# Patient Record
Sex: Female | Born: 1940 | Race: Black or African American | Hispanic: No | Marital: Married | Smoking: Former smoker
Health system: Southern US, Community
[De-identification: ages and names within clinical notes are randomized; demographics above are authoritative.]

## PROBLEM LIST (undated history)

## (undated) DIAGNOSIS — Z9289 Personal history of other medical treatment: Secondary | ICD-10-CM

## (undated) DIAGNOSIS — C801 Malignant (primary) neoplasm, unspecified: Secondary | ICD-10-CM

## (undated) DIAGNOSIS — I10 Essential (primary) hypertension: Secondary | ICD-10-CM

## (undated) DIAGNOSIS — W19XXXA Unspecified fall, initial encounter: Secondary | ICD-10-CM

## (undated) DIAGNOSIS — R296 Repeated falls: Secondary | ICD-10-CM

## (undated) DIAGNOSIS — D649 Anemia, unspecified: Secondary | ICD-10-CM

## (undated) HISTORY — PX: UPPER GI ENDOSCOPY: SHX6162

## (undated) HISTORY — PX: DILATION AND CURETTAGE OF UTERUS: SHX78

## (undated) HISTORY — PX: OTHER SURGICAL HISTORY: SHX169

---

## 2003-09-11 ENCOUNTER — Emergency Department (HOSPITAL_COMMUNITY): Admission: EM | Admit: 2003-09-11 | Discharge: 2003-09-12 | Payer: Self-pay | Admitting: Emergency Medicine

## 2014-03-03 DIAGNOSIS — C801 Malignant (primary) neoplasm, unspecified: Secondary | ICD-10-CM

## 2014-03-03 HISTORY — DX: Malignant (primary) neoplasm, unspecified: C80.1

## 2014-11-14 ENCOUNTER — Ambulatory Visit (HOSPITAL_COMMUNITY)
Admission: RE | Admit: 2014-11-14 | Discharge: 2014-11-14 | Disposition: A | Discharge status: Home / Self Care | Payer: Medicare Other | Source: Ambulatory Visit | Attending: Vascular Surgery | Admitting: Vascular Surgery

## 2014-11-14 ENCOUNTER — Other Ambulatory Visit (HOSPITAL_COMMUNITY): Payer: Self-pay | Admitting: Family Medicine

## 2014-11-14 DIAGNOSIS — R0989 Other specified symptoms and signs involving the circulatory and respiratory systems: Secondary | ICD-10-CM

## 2014-11-14 DIAGNOSIS — I6523 Occlusion and stenosis of bilateral carotid arteries: Secondary | ICD-10-CM | POA: Diagnosis not present

## 2014-12-19 ENCOUNTER — Encounter (HOSPITAL_COMMUNITY): Payer: Self-pay

## 2014-12-19 ENCOUNTER — Other Ambulatory Visit (HOSPITAL_COMMUNITY): Payer: Self-pay | Admitting: *Deleted

## 2014-12-19 ENCOUNTER — Other Ambulatory Visit (HOSPITAL_COMMUNITY): Payer: Self-pay | Admitting: Physician Assistant

## 2014-12-19 ENCOUNTER — Ambulatory Visit (HOSPITAL_COMMUNITY)
Admission: RE | Admit: 2014-12-19 | Discharge: 2014-12-19 | Disposition: A | Discharge status: Home / Self Care | Payer: Medicare Other | Source: Ambulatory Visit | Attending: Physician Assistant | Admitting: Physician Assistant

## 2014-12-19 DIAGNOSIS — R1903 Right lower quadrant abdominal swelling, mass and lump: Secondary | ICD-10-CM

## 2014-12-19 DIAGNOSIS — K921 Melena: Secondary | ICD-10-CM

## 2014-12-19 DIAGNOSIS — K5900 Constipation, unspecified: Secondary | ICD-10-CM | POA: Diagnosis not present

## 2014-12-19 DIAGNOSIS — I2510 Atherosclerotic heart disease of native coronary artery without angina pectoris: Secondary | ICD-10-CM | POA: Insufficient documentation

## 2014-12-19 DIAGNOSIS — R109 Unspecified abdominal pain: Secondary | ICD-10-CM | POA: Insufficient documentation

## 2014-12-19 DIAGNOSIS — I700 Atherosclerosis of aorta: Secondary | ICD-10-CM | POA: Insufficient documentation

## 2014-12-19 HISTORY — DX: Essential (primary) hypertension: I10

## 2014-12-19 MED ORDER — IOHEXOL 300 MG/ML  SOLN
80.0000 mL | Freq: Once | INTRAMUSCULAR | Status: AC | PRN
  Administered 2014-12-19: 80 mL via INTRAVENOUS

## 2014-12-20 ENCOUNTER — Encounter (HOSPITAL_COMMUNITY)
Admission: RE | Admit: 2014-12-20 | Discharge: 2014-12-20 | Disposition: A | Discharge status: Home / Self Care | Payer: Medicare Other | Source: Ambulatory Visit | Attending: Gastroenterology | Admitting: Gastroenterology

## 2014-12-20 DIAGNOSIS — R195 Other fecal abnormalities: Secondary | ICD-10-CM | POA: Insufficient documentation

## 2014-12-20 DIAGNOSIS — D649 Anemia, unspecified: Secondary | ICD-10-CM | POA: Insufficient documentation

## 2014-12-20 LAB — CBC
HEMATOCRIT: 30.9 % — AB (ref 36.0–46.0)
HEMOGLOBIN: 10 g/dL — AB (ref 12.0–15.0)
MCH: 21.6 pg — AB (ref 26.0–34.0)
MCHC: 32.4 g/dL (ref 30.0–36.0)
MCV: 66.7 fL — ABNORMAL LOW (ref 78.0–100.0)
Platelets: 451 10*3/uL — ABNORMAL HIGH (ref 150–400)
RBC: 4.63 MIL/uL (ref 3.87–5.11)
RDW: 19.5 % — ABNORMAL HIGH (ref 11.5–15.5)
WBC: 11.2 10*3/uL — ABNORMAL HIGH (ref 4.0–10.5)

## 2014-12-20 LAB — PREPARE RBC (CROSSMATCH)

## 2014-12-20 LAB — ABO/RH: ABO/RH(D): B POS

## 2014-12-20 MED ORDER — SODIUM CHLORIDE 0.9 % IV SOLN
Freq: Once | INTRAVENOUS | Status: AC
  Administered 2014-12-20: 10:00:00 via INTRAVENOUS

## 2014-12-21 LAB — TYPE AND SCREEN
ABO/RH(D): B POS
Antibody Screen: NEGATIVE
UNIT DIVISION: 0
Unit division: 0

## 2014-12-22 ENCOUNTER — Other Ambulatory Visit: Payer: Self-pay | Admitting: Gastroenterology

## 2014-12-22 DIAGNOSIS — K6389 Other specified diseases of intestine: Secondary | ICD-10-CM

## 2014-12-22 DIAGNOSIS — C189 Malignant neoplasm of colon, unspecified: Secondary | ICD-10-CM

## 2014-12-25 ENCOUNTER — Other Ambulatory Visit: Payer: Self-pay | Admitting: General Surgery

## 2014-12-25 NOTE — H&P (Signed)
History of Present Illness Olivia Ruff MD; 09/32/6712 6:05 PM) The patient is a 74 year old female who presents with colorectal cancer. 74 year old female who presents to the office with a large right-sided colonic mass concerning for colon cancer. Patient has a history of a recent blood transfusion due to anemia and positive FOBT. She underwent a CT scan of the abdomen and pelvis on 12/19/2014. This showed a large ascending colon mass without any obvious signs of obstruction. Colonoscopy was performed on 12/21/2014. This was done by Dr. Laurence Spates. An ulcerated completely obstructing large mass was found in the rectosigmoid colon. He was unable to evaluate the right side of the colon. She continues to have blood in her stools. She is losing weight (~25lbs). Her CEA is elevated at 7.4. Her CT scan of the abdomen shows no signs of metastatic disease. Her CT scan of the chest is pending.   Other Problems Elbert Ewings, CMA; 12/25/2014 2:02 PM) High blood pressure  Past Surgical History Elbert Ewings, CMA; 12/25/2014 2:02 PM) No pertinent past surgical history  Diagnostic Studies History Elbert Ewings, [place]; 12/25/2014 2:02 PM) Colonoscopy within last year  Allergies Elbert Ewings, CMA; 12/25/2014 2:03 PM) No Known Drug Allergies 12/25/2014  Medication History Elbert Ewings, CMA; 12/25/2014 2:03 PM) AmLODIPine Besylate (10MG  Tablet, Oral) Active. Ciprofloxacin HCl (500MG  Tablet, Oral) Active. Triamterene-HCTZ (37.5-25MG  Tablet, Oral) Active. MiraLax (Oral) Active. Ferrous Sulfate (250MG  Capsule, Oral) Active. Medications Reconciled  Social History Elbert Ewings, [place]; 12/25/2014 2:02 PM) Alcohol use Remotely quit alcohol use. Caffeine use Carbonated beverages, Coffee, Tea. Tobacco use Former smoker.     Review of Systems Elbert Ewings CMA; 12/25/2014 2:02 PM) HEENT Present- Sore Throat and Wears glasses/contact lenses. Not Present- Earache, Hearing Loss,  Hoarseness, Nose Bleed, Oral Ulcers, Ringing in the Ears, Seasonal Allergies, Sinus Pain, Visual Disturbances and Yellow Eyes. Gastrointestinal Present- Bloody Stool and Difficulty Swallowing. Not Present- Abdominal Pain, Bloating, Change in Bowel Habits, Chronic diarrhea, Constipation, Excessive gas, Gets full quickly at meals, Hemorrhoids, Indigestion, Nausea, Rectal Pain and Vomiting. Female Genitourinary Not Present- Frequency, Nocturia, Painful Urination, Pelvic Pain and Urgency. Musculoskeletal Not Present- Back Pain, Joint Pain, Joint Stiffness, Muscle Pain, Muscle Weakness and Swelling of Extremities. Neurological Not Present- Decreased Memory, Fainting, Headaches, Numbness, Seizures, Tingling, Tremor, Trouble walking and Weakness. Hematology Not Present- Easy Bruising, Excessive bleeding, Gland problems, HIV and Persistent Infections.  Vitals Elbert Ewings CMA; 12/25/2014 2:04 PM) 12/25/2014 2:03 PM Weight: 118 lb Height: 62.5in Body Surface Area: 1.54 m Body Mass Index: 21.24 kg/m  Temp.: 97.53F(Temporal)  Pulse: 66 (Regular)  BP: 128/60 (Sitting, Left Arm, Standard)      Physical Exam Olivia Ruff MD; 45/80/9983 5:47 PM)  General Mental Status-Alert. General Appearance-Not in acute distress. Build & Nutrition-Well nourished. Posture-Normal posture. Gait-Normal.  Head and Neck Head-normocephalic, atraumatic with no lesions or palpable masses. Trachea-midline.  Chest and Lung Exam Chest and lung exam reveals -on auscultation, normal breath sounds, no adventitious sounds and normal vocal resonance.  Cardiovascular Cardiovascular examination reveals -normal heart sounds, regular rate and rhythm with no murmurs and femoral artery auscultation bilaterally reveals normal pulses, no bruits, no thrills.  Abdomen Inspection Inspection of the abdomen reveals - No Hernias. Palpation/Percussion Palpation and Percussion of the abdomen reveal -  Soft, Non Tender, No Rigidity (guarding), No hepatosplenomegaly and No Palpable abdominal masses.  Neurologic Neurologic evaluation reveals -alert and oriented x 3 with no impairment of recent or remote memory, normal attention span and ability to concentrate, normal sensation  and normal coordination.  Musculoskeletal Normal Exam - Bilateral-Upper Extremity Strength Normal and Lower Extremity Strength Normal.    Assessment & Plan Olivia Ruff MD; 33/29/5188 6:04 PM)  MASS OF COLON (K63.9) Impression: 74 year old female in good health who presents to the office with anemia. It appears she had a CT scan which showed a large right-sided mass concerning for colon cancer. Last week she underwent a colonoscopy with Dr. Oletta Lamas which showed an obstructing rectosigmoid mass at 15 cm. The rest of her colon was unable to be examined. On exam today her abdomen is soft and nondistended. She has no signs of obstruction. She is losing weight. She continues to have rectal bleeding. I discussed this with Dr. Oletta Lamas and he will bring her back for tattooing of the rectosigmoid lesion. We will then plan on doing a minimally invasive right colectomy and sigmoidectomy combined. We discussed doing a total colectomy with ileorectal anastomosis as well. We discussed the risks and benefits of this, which include need for additional screening in the future and possible need for additional surgery as well as the risks assumed with 2 anastomosis instead of one. Benfits mainly include having her remaining colon to hopefully prevent chronic diarrhea.  The surgery and anatomy were described to the patient as well as the risks of surgery and the possible complications. These include: Bleeding, deep abdominal infections and possible wound complications such as hernia and infection, damage to adjacent structures, leak of surgical connections, which can lead to other surgeries and possibly an ostomy, possible need for other  procedures, such as abscess drains in radiology, possible prolonged hospital stay, possible diarrhea from removal of part of the colon, possible constipation from narcotics, possible bowel, bladder or sexual dysfunction if having rectal surgery, prolonged fatigue/weakness or appetite loss, possible early recurrence of of disease, possible complications of their medical problems such as heart disease or arrhythmias or lung problems, death (less than 1%). I believe the patient understands and wishes to proceed with the surgery.

## 2014-12-29 ENCOUNTER — Ambulatory Visit
Admission: RE | Admit: 2014-12-29 | Discharge: 2014-12-29 | Disposition: A | Discharge status: Home / Self Care | Payer: Medicare Other | Source: Ambulatory Visit | Attending: Gastroenterology | Admitting: Gastroenterology

## 2014-12-29 ENCOUNTER — Other Ambulatory Visit: Payer: Self-pay | Admitting: Gastroenterology

## 2014-12-29 DIAGNOSIS — C189 Malignant neoplasm of colon, unspecified: Secondary | ICD-10-CM

## 2014-12-29 DIAGNOSIS — K6389 Other specified diseases of intestine: Secondary | ICD-10-CM

## 2015-02-09 ENCOUNTER — Encounter (HOSPITAL_COMMUNITY)
Admission: RE | Admit: 2015-02-09 | Discharge: 2015-02-09 | Disposition: A | Discharge status: Home / Self Care | Payer: Medicare Other | Source: Ambulatory Visit | Attending: General Surgery | Admitting: General Surgery

## 2015-02-09 ENCOUNTER — Encounter (HOSPITAL_COMMUNITY): Payer: Self-pay

## 2015-02-09 DIAGNOSIS — Z01810 Encounter for preprocedural cardiovascular examination: Secondary | ICD-10-CM | POA: Insufficient documentation

## 2015-02-09 DIAGNOSIS — Z01812 Encounter for preprocedural laboratory examination: Secondary | ICD-10-CM | POA: Diagnosis not present

## 2015-02-09 HISTORY — DX: Repeated falls: R29.6

## 2015-02-09 HISTORY — DX: Anemia, unspecified: D64.9

## 2015-02-09 HISTORY — DX: Personal history of other medical treatment: Z92.89

## 2015-02-09 HISTORY — DX: Unspecified fall, initial encounter: W19.XXXA

## 2015-02-09 LAB — BASIC METABOLIC PANEL
Anion gap: 12 (ref 5–15)
BUN: 20 mg/dL (ref 6–20)
CALCIUM: 9.5 mg/dL (ref 8.9–10.3)
CHLORIDE: 100 mmol/L — AB (ref 101–111)
CO2: 24 mmol/L (ref 22–32)
CREATININE: 0.88 mg/dL (ref 0.44–1.00)
GFR calc Af Amer: >60 mL/min (ref >60)
GFR calc non Af Amer: >60 mL/min (ref >60)
Glucose, Bld: 96 mg/dL (ref 65–99)
Potassium: 4 mmol/L (ref 3.5–5.1)
SODIUM: 136 mmol/L (ref 135–145)

## 2015-02-09 LAB — CBC
HCT: 25.2 % — ABNORMAL LOW (ref 36.0–46.0)
Hemoglobin: 7.7 g/dL — ABNORMAL LOW (ref 12.0–15.0)
MCH: 23.5 pg — AB (ref 26.0–34.0)
MCHC: 30.6 g/dL (ref 30.0–36.0)
MCV: 76.8 fL — AB (ref 78.0–100.0)
PLATELETS: 719 10*3/uL — AB (ref 150–400)
RBC: 3.28 MIL/uL — ABNORMAL LOW (ref 3.87–5.11)
RDW: 17.8 % — AB (ref 11.5–15.5)
WBC: 11.9 10*3/uL — AB (ref 4.0–10.5)

## 2015-02-09 NOTE — Patient Instructions (Signed)
BRANA LUCHINI  02/09/2015   Your procedure is scheduled on: Thursday February 15, 2015   Report to [facility] Main  Entrance take [place]  elevators to 3rd floor to  [place] at 5:00 AM.  Call this number if you have problems the morning of surgery [phone]   Remember: ONLY 1 PERSON MAY GO WITH YOU TO SHORT STAY TO GET  READY MORNING OF [place].  Do not eat food or drink liquids :After Midnight.     Take these medicines the morning of surgery with A SIP OF WATER: NONE                               You may not have any metal on your body including hair pins and              piercings  Do not wear jewelry, make-up, lotions, powders or perfumes, deodorant             Do not wear nail polish.  Do not shave  48 hours prior to surgery.              Do not bring valuables to the hospital. [place].  Contacts, dentures or bridgework may not be worn into surgery.  Leave suitcase in the car. After surgery it may be brought to your room.    Special Instructions: FOLLOW SURGEON'S INSTRUCTION IN REGARDS TO BOWEL PREPARATION PRIOR TO SURGICAL DATE                                         DO NOT TAKE ANY ASPIRIN OR HERBAL MEDICATIONS 5 DAYS PRIOR TO SURGERY            _____________________________________________________________________             [facility] - Preparing for Surgery Before surgery, you can play an important role.  Because skin is not sterile, your skin needs to be as free of germs as possible.  You can reduce the number of germs on your skin by washing with CHG (chlorahexidine gluconate) soap before surgery.  CHG is an antiseptic cleaner which kills germs and bonds with the skin to continue killing germs even after washing. Please DO NOT use if you have an allergy to CHG or antibacterial soaps.  If your skin becomes reddened/irritated stop using the CHG and inform your nurse when you  arrive at Short Stay. Do not shave (including legs and underarms) for at least 48 hours prior to the first CHG shower.  You may shave your face/neck. Please follow these instructions carefully:  1.  Shower with CHG Soap the night before surgery and the  morning of Surgery.  2.  If you choose to wash your hair, wash your hair first as usual with your  normal  shampoo.  3.  After you shampoo, rinse your hair and body thoroughly to remove the  shampoo.                           4.  Use CHG as you would any other liquid  soap.  You can apply chg directly  to the skin and wash                       Gently with a scrungie or clean washcloth.  5.  Apply the CHG Soap to your body ONLY FROM THE NECK DOWN.   Do not use on face/ open                           Wound or open sores. Avoid contact with eyes, ears mouth and genitals (private parts).                       Wash face,  Genitals (private parts) with your normal soap.             6.  Wash thoroughly, paying special attention to the area where your surgery  will be performed.  7.  Thoroughly rinse your body with warm water from the neck down.  8.  DO NOT shower/wash with your normal soap after using and rinsing off  the CHG Soap.                9.  Pat yourself dry with a clean towel.            10.  Wear clean pajamas.            11.  Place clean sheets on your bed the night of your first shower and do not  sleep with pets. Day of Surgery : Do not apply any lotions/deodorants the morning of surgery.  Please wear clean clothes to the hospital/surgery center.  FAILURE TO FOLLOW THESE INSTRUCTIONS MAY RESULT IN THE CANCELLATION OF YOUR SURGERY PATIENT SIGNATURE_________________________________  NURSE SIGNATURE__________________________________  ________________________________________________________________________

## 2015-02-09 NOTE — Progress Notes (Signed)
CT Chest 12/29/2014 epic

## 2015-02-10 LAB — HEMOGLOBIN A1C
Hgb A1c MFr Bld: 5.1 % (ref 4.8–5.6)
Mean Plasma Glucose: 100 mg/dL

## 2015-02-15 ENCOUNTER — Inpatient Hospital Stay (HOSPITAL_COMMUNITY): Payer: Medicare Other | Admitting: Anesthesiology

## 2015-02-15 ENCOUNTER — Inpatient Hospital Stay (HOSPITAL_COMMUNITY)
Admission: RE | Admit: 2015-02-15 | Discharge: 2015-02-20 | DRG: 330 | Disposition: A | Discharge status: Home / Self Care | Payer: Medicare Other | Source: Ambulatory Visit | Attending: General Surgery | Admitting: General Surgery

## 2015-02-15 ENCOUNTER — Encounter (HOSPITAL_COMMUNITY)
Admission: RE | Disposition: A | Discharge status: Home / Self Care | Payer: Self-pay | Source: Ambulatory Visit | Attending: General Surgery

## 2015-02-15 ENCOUNTER — Encounter (HOSPITAL_COMMUNITY): Payer: Self-pay | Admitting: *Deleted

## 2015-02-15 DIAGNOSIS — D62 Acute posthemorrhagic anemia: Secondary | ICD-10-CM | POA: Diagnosis present

## 2015-02-15 DIAGNOSIS — N736 Female pelvic peritoneal adhesions (postinfective): Secondary | ICD-10-CM | POA: Diagnosis present

## 2015-02-15 DIAGNOSIS — C182 Malignant neoplasm of ascending colon: Principal | ICD-10-CM | POA: Diagnosis present

## 2015-02-15 DIAGNOSIS — I10 Essential (primary) hypertension: Secondary | ICD-10-CM | POA: Diagnosis present

## 2015-02-15 DIAGNOSIS — Z87891 Personal history of nicotine dependence: Secondary | ICD-10-CM | POA: Diagnosis not present

## 2015-02-15 DIAGNOSIS — Z01812 Encounter for preprocedural laboratory examination: Secondary | ICD-10-CM | POA: Diagnosis not present

## 2015-02-15 DIAGNOSIS — K639 Disease of intestine, unspecified: Secondary | ICD-10-CM | POA: Diagnosis present

## 2015-02-15 DIAGNOSIS — K625 Hemorrhage of anus and rectum: Secondary | ICD-10-CM | POA: Diagnosis present

## 2015-02-15 DIAGNOSIS — E861 Hypovolemia: Secondary | ICD-10-CM | POA: Diagnosis present

## 2015-02-15 HISTORY — PX: LAPAROSCOPIC PARTIAL COLECTOMY: SHX5907

## 2015-02-15 HISTORY — PX: UPPER GI ENDOSCOPY: SHX6162

## 2015-02-15 LAB — POCT I-STAT 4, (NA,K, GLUC, HGB,HCT)
GLUCOSE: 99 mg/dL (ref 65–99)
HEMATOCRIT: 27 % — AB (ref 36.0–46.0)
Hemoglobin: 9.2 g/dL — ABNORMAL LOW (ref 12.0–15.0)
POTASSIUM: 2.8 mmol/L — AB (ref 3.5–5.1)
SODIUM: 130 mmol/L — AB (ref 135–145)

## 2015-02-15 LAB — ABO/RH: ABO/RH(D): B POS

## 2015-02-15 SURGERY — LAPAROSCOPIC PARTIAL COLECTOMY
Anesthesia: General

## 2015-02-15 MED ORDER — DIPHENHYDRAMINE HCL 12.5 MG/5ML PO ELIX: 12.5000 mg | ORAL_SOLUTION | Freq: Four times a day (QID) | ORAL | Status: DC | PRN

## 2015-02-15 MED ORDER — FENTANYL CITRATE (PF) 250 MCG/5ML IJ SOLN
INTRAMUSCULAR | Status: AC
  Filled 2015-02-15: qty 5

## 2015-02-15 MED ORDER — ALVIMOPAN 12 MG PO CAPS
12.0000 mg | ORAL_CAPSULE | Freq: Two times a day (BID) | ORAL | Status: DC
  Administered 2015-02-16 – 2015-02-18 (×5): 12 mg via ORAL
  Filled 2015-02-15 (×7): qty 1

## 2015-02-15 MED ORDER — ROCURONIUM BROMIDE 100 MG/10ML IV SOLN
INTRAVENOUS | Status: AC
  Filled 2015-02-15: qty 1

## 2015-02-15 MED ORDER — ROCURONIUM BROMIDE 100 MG/10ML IV SOLN
INTRAVENOUS | Status: DC | PRN
  Administered 2015-02-15: 5 mg via INTRAVENOUS
  Administered 2015-02-15: 45 mg via INTRAVENOUS

## 2015-02-15 MED ORDER — PROPOFOL 10 MG/ML IV BOLUS
INTRAVENOUS | Status: DC | PRN
  Administered 2015-02-15: 100 mg via INTRAVENOUS

## 2015-02-15 MED ORDER — ALBUMIN HUMAN 5 % IV SOLN
INTRAVENOUS | Status: AC
  Filled 2015-02-15: qty 250

## 2015-02-15 MED ORDER — BUPIVACAINE-EPINEPHRINE 0.25% -1:200000 IJ SOLN
INTRAMUSCULAR | Status: AC
Start: 2015-02-15 — End: 2015-02-15
  Filled 2015-02-15: qty 1

## 2015-02-15 MED ORDER — LACTATED RINGERS IR SOLN
Status: DC | PRN
  Administered 2015-02-15: 1000 mL

## 2015-02-15 MED ORDER — SUGAMMADEX SODIUM 200 MG/2ML IV SOLN
INTRAVENOUS | Status: AC
  Filled 2015-02-15: qty 2

## 2015-02-15 MED ORDER — FENTANYL CITRATE (PF) 100 MCG/2ML IJ SOLN
INTRAMUSCULAR | Status: DC | PRN
  Administered 2015-02-15 (×3): 50 ug via INTRAVENOUS
  Administered 2015-02-15: 100 ug via INTRAVENOUS

## 2015-02-15 MED ORDER — CEFOTETAN DISODIUM-DEXTROSE 2-2.08 GM-% IV SOLR
INTRAVENOUS | Status: AC
  Filled 2015-02-15: qty 50

## 2015-02-15 MED ORDER — ONDANSETRON HCL 4 MG/2ML IJ SOLN: 4.0000 mg | Freq: Once | INTRAMUSCULAR | Status: DC | PRN

## 2015-02-15 MED ORDER — MORPHINE SULFATE (PF) 2 MG/ML IV SOLN
2.0000 mg | INTRAVENOUS | Status: DC | PRN
  Administered 2015-02-15 – 2015-02-18 (×8): 2 mg via INTRAVENOUS
  Filled 2015-02-15 (×8): qty 1

## 2015-02-15 MED ORDER — PHENYLEPHRINE HCL 10 MG/ML IJ SOLN
INTRAMUSCULAR | Status: DC | PRN
  Administered 2015-02-15 (×2): 80 ug via INTRAVENOUS

## 2015-02-15 MED ORDER — DEXAMETHASONE SODIUM PHOSPHATE 10 MG/ML IJ SOLN
INTRAMUSCULAR | Status: DC | PRN
  Administered 2015-02-15: 10 mg via INTRAVENOUS

## 2015-02-15 MED ORDER — ALVIMOPAN 12 MG PO CAPS
12.0000 mg | ORAL_CAPSULE | Freq: Once | ORAL | Status: AC
  Administered 2015-02-15: 12 mg via ORAL
  Filled 2015-02-15: qty 1

## 2015-02-15 MED ORDER — ONDANSETRON HCL 4 MG/2ML IJ SOLN
INTRAMUSCULAR | Status: AC
  Filled 2015-02-15: qty 2

## 2015-02-15 MED ORDER — PROPOFOL 10 MG/ML IV BOLUS
INTRAVENOUS | Status: AC
  Filled 2015-02-15: qty 20

## 2015-02-15 MED ORDER — 0.9 % SODIUM CHLORIDE (POUR BTL) OPTIME
TOPICAL | Status: DC | PRN
  Administered 2015-02-15: 2000 mL

## 2015-02-15 MED ORDER — LIDOCAINE HCL (CARDIAC) 20 MG/ML IV SOLN
INTRAVENOUS | Status: AC
  Filled 2015-02-15: qty 5

## 2015-02-15 MED ORDER — MIDAZOLAM HCL 5 MG/5ML IJ SOLN
INTRAMUSCULAR | Status: DC | PRN
  Administered 2015-02-15 (×2): 1 mg via INTRAVENOUS

## 2015-02-15 MED ORDER — LIDOCAINE HCL (PF) 2 % IJ SOLN
INTRAMUSCULAR | Status: DC | PRN
  Administered 2015-02-15: 30 mg via INTRADERMAL

## 2015-02-15 MED ORDER — ONDANSETRON HCL 4 MG PO TABS: 4.0000 mg | ORAL_TABLET | Freq: Four times a day (QID) | ORAL | Status: DC | PRN

## 2015-02-15 MED ORDER — ENOXAPARIN SODIUM 40 MG/0.4ML ~~LOC~~ SOLN
40.0000 mg | SUBCUTANEOUS | Status: DC
  Administered 2015-02-16 – 2015-02-20 (×5): 40 mg via SUBCUTANEOUS
  Filled 2015-02-15 (×6): qty 0.4

## 2015-02-15 MED ORDER — ONDANSETRON HCL 4 MG/2ML IJ SOLN
4.0000 mg | Freq: Four times a day (QID) | INTRAMUSCULAR | Status: DC | PRN
  Administered 2015-02-15: 4 mg via INTRAVENOUS
  Filled 2015-02-15: qty 2

## 2015-02-15 MED ORDER — TRIAMTERENE-HCTZ 37.5-25 MG PO TABS
0.5000 | ORAL_TABLET | Freq: Every day | ORAL | Status: DC
  Administered 2015-02-16 – 2015-02-20 (×5): 0.5 via ORAL
  Filled 2015-02-15 (×6): qty 0.5

## 2015-02-15 MED ORDER — SODIUM CHLORIDE 0.9 % IV SOLN
INTRAVENOUS | Status: DC | PRN
  Administered 2015-02-15: 09:00:00 via INTRAVENOUS

## 2015-02-15 MED ORDER — LACTATED RINGERS IV SOLN
INTRAVENOUS | Status: DC | PRN
  Administered 2015-02-15 (×2): via INTRAVENOUS

## 2015-02-15 MED ORDER — SUGAMMADEX SODIUM 200 MG/2ML IV SOLN
INTRAVENOUS | Status: DC | PRN
  Administered 2015-02-15: 100 mg via INTRAVENOUS

## 2015-02-15 MED ORDER — ALBUMIN HUMAN 5 % IV SOLN
12.5000 g | Freq: Once | INTRAVENOUS | Status: AC
  Administered 2015-02-15: 12.5 g via INTRAVENOUS

## 2015-02-15 MED ORDER — AMLODIPINE BESYLATE 5 MG PO TABS
5.0000 mg | ORAL_TABLET | Freq: Every day | ORAL | Status: DC
  Administered 2015-02-16 – 2015-02-20 (×4): 5 mg via ORAL
  Filled 2015-02-15 (×6): qty 1

## 2015-02-15 MED ORDER — MIDAZOLAM HCL 2 MG/2ML IJ SOLN
INTRAMUSCULAR | Status: AC
  Filled 2015-02-15: qty 2

## 2015-02-15 MED ORDER — SODIUM CHLORIDE 0.9 % IV SOLN: Freq: Once | INTRAVENOUS | Status: DC

## 2015-02-15 MED ORDER — HYDROMORPHONE HCL 1 MG/ML IJ SOLN
INTRAMUSCULAR | Status: DC | PRN
  Administered 2015-02-15 (×2): 1 mg via INTRAVENOUS

## 2015-02-15 MED ORDER — ACETAMINOPHEN 500 MG PO TABS
1000.0000 mg | ORAL_TABLET | Freq: Four times a day (QID) | ORAL | Status: AC
  Administered 2015-02-16: 1000 mg via ORAL
  Filled 2015-02-15: qty 2

## 2015-02-15 MED ORDER — SODIUM CHLORIDE 0.9 % IV SOLN
INTRAVENOUS | Status: DC
  Administered 2015-02-15 – 2015-02-16 (×2): 100 mL/h via INTRAVENOUS

## 2015-02-15 MED ORDER — CEFOTETAN DISODIUM 2 G IJ SOLR
2.0000 g | Freq: Two times a day (BID) | INTRAMUSCULAR | Status: AC
  Administered 2015-02-15: 2 g via INTRAVENOUS
  Filled 2015-02-15: qty 2

## 2015-02-15 MED ORDER — ONDANSETRON HCL 4 MG/2ML IJ SOLN
INTRAMUSCULAR | Status: DC | PRN
  Administered 2015-02-15: 4 mg via INTRAVENOUS

## 2015-02-15 MED ORDER — HYDROMORPHONE HCL 1 MG/ML IJ SOLN: 0.2500 mg | INTRAMUSCULAR | Status: DC | PRN

## 2015-02-15 MED ORDER — HYDROMORPHONE HCL 2 MG/ML IJ SOLN
INTRAMUSCULAR | Status: AC
  Filled 2015-02-15: qty 1

## 2015-02-15 MED ORDER — BUPIVACAINE-EPINEPHRINE 0.25% -1:200000 IJ SOLN
INTRAMUSCULAR | Status: DC | PRN
  Administered 2015-02-15: 35 mL

## 2015-02-15 MED ORDER — HEPARIN SODIUM (PORCINE) 5000 UNIT/ML IJ SOLN
5000.0000 [IU] | Freq: Once | INTRAMUSCULAR | Status: AC
  Administered 2015-02-15: 5000 [IU] via SUBCUTANEOUS
  Filled 2015-02-15: qty 1

## 2015-02-15 MED ORDER — DIPHENHYDRAMINE HCL 50 MG/ML IJ SOLN
12.5000 mg | Freq: Four times a day (QID) | INTRAMUSCULAR | Status: DC | PRN
Start: 2015-02-15 — End: 2015-02-20

## 2015-02-15 MED ORDER — DEXTROSE 5 % IV SOLN
2.0000 g | INTRAVENOUS | Status: AC
  Administered 2015-02-15: 2 g via INTRAVENOUS

## 2015-02-15 MED ORDER — LACTATED RINGERS IV SOLN
INTRAVENOUS | Status: DC | PRN
  Administered 2015-02-15: 07:00:00 via INTRAVENOUS

## 2015-02-15 MED ORDER — PHENYLEPHRINE 40 MCG/ML (10ML) SYRINGE FOR IV PUSH (FOR BLOOD PRESSURE SUPPORT)
PREFILLED_SYRINGE | INTRAVENOUS | Status: AC
Start: 2015-02-15 — End: 2015-02-15
  Filled 2015-02-15: qty 10

## 2015-02-15 MED ORDER — DEXAMETHASONE SODIUM PHOSPHATE 10 MG/ML IJ SOLN
INTRAMUSCULAR | Status: AC
  Filled 2015-02-15: qty 1

## 2015-02-15 SURGICAL SUPPLY — 85 items
APPLIER CLIP 5 13 M/L LIGAMAX5 (MISCELLANEOUS)
APR CLP MED LRG 5 ANG JAW (MISCELLANEOUS)
BLADE EXTENDED COATED 6.5IN (ELECTRODE) ×2 IMPLANT
CABLE HIGH FREQUENCY MONO STRZ (ELECTRODE) IMPLANT
CELLS DAT CNTRL 66122 CELL SVR (MISCELLANEOUS) IMPLANT
CHLORAPREP W/TINT 26ML (MISCELLANEOUS) ×2 IMPLANT
CLIP APPLIE 5 13 M/L LIGAMAX5 (MISCELLANEOUS) IMPLANT
COVER MAYO STAND STRL (DRAPES) ×12 IMPLANT
COVER SURGICAL LIGHT HANDLE (MISCELLANEOUS) ×6 IMPLANT
DECANTER SPIKE VIAL GLASS SM (MISCELLANEOUS) ×4 IMPLANT
DRAIN CHANNEL 19F RND (DRAIN) ×2 IMPLANT
DRAPE LAPAROSCOPIC ABDOMINAL (DRAPES) ×4 IMPLANT
DRAPE SURG IRRIG POUCH 19X23 (DRAPES) ×4 IMPLANT
DRSG OPSITE POSTOP 4X10 (GAUZE/BANDAGES/DRESSINGS) IMPLANT
DRSG OPSITE POSTOP 4X6 (GAUZE/BANDAGES/DRESSINGS) ×2 IMPLANT
DRSG OPSITE POSTOP 4X8 (GAUZE/BANDAGES/DRESSINGS) IMPLANT
DRSG TEGADERM 4X4.75 (GAUZE/BANDAGES/DRESSINGS) ×2 IMPLANT
ELECT PENCIL ROCKER SW 15FT (MISCELLANEOUS) ×8 IMPLANT
ELECT REM PT RETURN 9FT ADLT (ELECTROSURGICAL) ×4
ELECTRODE REM PT RTRN 9FT ADLT (ELECTROSURGICAL) ×2 IMPLANT
ENDOLOOP SUT PDS II  0 18 (SUTURE)
ENDOLOOP SUT PDS II 0 18 (SUTURE) IMPLANT
EVACUATOR SILICONE 100CC (DRAIN) ×2 IMPLANT
GAUZE SPONGE 4X4 12PLY STRL (GAUZE/BANDAGES/DRESSINGS) IMPLANT
GLOVE BIO SURGEON STRL SZ 6.5 (GLOVE) ×6 IMPLANT
GLOVE BIO SURGEONS STRL SZ 6.5 (GLOVE) ×2
GLOVE BIOGEL PI IND STRL 7.0 (GLOVE) ×4 IMPLANT
GLOVE BIOGEL PI INDICATOR 7.0 (GLOVE) ×20
GOWN STRL REUS W/TWL 2XL LVL3 (GOWN DISPOSABLE) ×8 IMPLANT
GOWN STRL REUS W/TWL XL LVL3 (GOWN DISPOSABLE) ×24 IMPLANT
HANDLE STAPLE EGIA 4 XL (STAPLE) ×2 IMPLANT
HANDLE SUCTION POOLE (INSTRUMENTS) IMPLANT
LEGGING LITHOTOMY PAIR STRL (DRAPES) ×2 IMPLANT
LIGASURE IMPACT 36 18CM CVD LR (INSTRUMENTS) IMPLANT
LIQUID BAND (GAUZE/BANDAGES/DRESSINGS) ×2 IMPLANT
PACK COLON (CUSTOM PROCEDURE TRAY) ×4 IMPLANT
PAD POSITIONING PINK XL (MISCELLANEOUS) ×4 IMPLANT
PORT LAP GEL ALEXIS MED 5-9CM (MISCELLANEOUS) ×2 IMPLANT
RELOAD EGIA 60 MED/THCK PURPLE (STAPLE) ×4 IMPLANT
RELOAD PROXIMATE 75MM BLUE (ENDOMECHANICALS) ×8 IMPLANT
RELOAD STAPLE 60 MED/THCK ART (STAPLE) IMPLANT
RELOAD STAPLE 75 3.8 BLU REG (ENDOMECHANICALS) IMPLANT
RETRACTOR WND ALEXIS 18 MED (MISCELLANEOUS) IMPLANT
RTRCTR WOUND ALEXIS 18CM MED (MISCELLANEOUS)
SCISSORS LAP 5X35 DISP (ENDOMECHANICALS) ×4 IMPLANT
SEALER TISSUE G2 STRG ARTC 35C (ENDOMECHANICALS) ×2 IMPLANT
SET IRRIG TUBING LAPAROSCOPIC (IRRIGATION / IRRIGATOR) ×4 IMPLANT
SLEEVE XCEL OPT CAN 5 100 (ENDOMECHANICALS) ×6 IMPLANT
SPONGE DRAIN TRACH 4X4 STRL 2S (GAUZE/BANDAGES/DRESSINGS) ×2 IMPLANT
SPONGE LAP 18X18 X RAY DECT (DISPOSABLE) IMPLANT
STAPLER CIRC ILS CVD 33MM 37CM (STAPLE) ×2 IMPLANT
STAPLER GUN LINEAR PROX 60 (STAPLE) ×2 IMPLANT
STAPLER PROXIMATE 75MM BLUE (STAPLE) ×2 IMPLANT
STAPLER VISISTAT 35W (STAPLE) ×4 IMPLANT
SUCTION POOLE HANDLE (INSTRUMENTS) ×4
SUT ETHILON 2 0 PS N (SUTURE) ×2 IMPLANT
SUT NOVA 1 T20/GS 25DT (SUTURE) IMPLANT
SUT NOVA NAB DX-16 0-1 5-0 T12 (SUTURE) ×4 IMPLANT
SUT PDS AB 1 CTX 36 (SUTURE) IMPLANT
SUT PDS AB 1 TP1 96 (SUTURE) IMPLANT
SUT PROLENE 2 0 KS (SUTURE) ×2 IMPLANT
SUT SILK 2 0 (SUTURE) ×4
SUT SILK 2 0 SH CR/8 (SUTURE) ×4 IMPLANT
SUT SILK 2-0 18XBRD TIE 12 (SUTURE) ×2 IMPLANT
SUT SILK 3 0 (SUTURE) ×4
SUT SILK 3 0 SH CR/8 (SUTURE) ×8 IMPLANT
SUT SILK 3-0 18XBRD TIE 12 (SUTURE) ×2 IMPLANT
SUT VIC AB 2-0 SH 18 (SUTURE) ×2 IMPLANT
SUT VIC AB 4-0 PS2 18 (SUTURE) IMPLANT
SUT VIC AB 4-0 PS2 27 (SUTURE) ×4 IMPLANT
SUT VICRYL 0 UR6 27IN ABS (SUTURE) ×2 IMPLANT
SUT VICRYL 2 0 18  UND BR (SUTURE)
SUT VICRYL 2 0 18 UND BR (SUTURE) IMPLANT
SYS LAPSCP GELPORT 120MM (MISCELLANEOUS)
SYSTEM LAPSCP GELPORT 120MM (MISCELLANEOUS) IMPLANT
TOWEL OR 17X26 10 PK STRL BLUE (TOWEL DISPOSABLE) ×2 IMPLANT
TOWEL OR NON WOVEN STRL DISP B (DISPOSABLE) ×4 IMPLANT
TRAY FOLEY W/METER SILVER 14FR (SET/KITS/TRAYS/PACK) ×4 IMPLANT
TRAY FOLEY W/METER SILVER 16FR (SET/KITS/TRAYS/PACK) ×2 IMPLANT
TROCAR BLADELESS OPT 5 100 (ENDOMECHANICALS) ×4 IMPLANT
TROCAR XCEL 12X100 BLDLESS (ENDOMECHANICALS) IMPLANT
TROCAR XCEL BLUNT TIP 100MML (ENDOMECHANICALS) IMPLANT
TROCAR XCEL NON-BLD 11X100MML (ENDOMECHANICALS) IMPLANT
TUBING FILTER THERMOFLATOR (ELECTROSURGICAL) ×4 IMPLANT
TUBING INSUFFLATION 10FT LAP (TUBING) ×4 IMPLANT

## 2015-02-15 NOTE — Transfer of Care (Signed)
Immediate Anesthesia Transfer of Care Note  Patient: Olivia Jackson  Procedure(s) Performed: Procedure(s): LAPAROSCOPIC RIGHT COLECTOMY AND SIGMOIDECTOMY PROCTOSCOPY (N/A) UPPER GI ENDOSCOPY  Patient Location: PACU  Anesthesia Type:General  Level of Consciousness:  sedated, patient cooperative and responds to stimulation  Airway & Oxygen Therapy:Patient Spontanous Breathing and Patient connected to face mask oxgen  Post-op Assessment:  Report given to PACU RN and Post -op Vital signs reviewed and stable  Post vital signs:  Reviewed and stable  Last Vitals:  Filed Vitals:   02/15/15 0517  BP: 135/64  Pulse: 56  Temp: 36.4 C  Resp: 16    Complications: No apparent anesthesia complications

## 2015-02-15 NOTE — Anesthesia Postprocedure Evaluation (Signed)
Anesthesia Post Note  Patient: ALLYSANDRA LIANG  Procedure(s) Performed: Procedure(s) (LRB): LAPAROSCOPIC RIGHT COLECTOMY AND SIGMOIDECTOMY PROCTOSCOPY (N/A) UPPER GI ENDOSCOPY  Patient location during evaluation: PACU Anesthesia Type: General Level of consciousness: awake and alert Pain management: pain level controlled Vital Signs Assessment: post-procedure vital signs reviewed and stable Respiratory status: spontaneous breathing, nonlabored ventilation, respiratory function stable and patient connected to nasal cannula oxygen Cardiovascular status: blood pressure returned to baseline and stable Postop Assessment: no signs of nausea or vomiting Anesthetic complications: no Comments: UOP is >27ml/hr in pacu, pain well controlled, following commands, BP with MAP >70 which is baseline, extremities warm and well perfused    Last Vitals:  Filed Vitals:   02/15/15 1140 02/15/15 1145  BP: 98/49 82/63  Pulse: 84 84  Temp:    Resp: 4 6    Last Pain:  Filed Vitals:   02/15/15 1146  PainSc: Asleep                 Zenaida Deed

## 2015-02-15 NOTE — Progress Notes (Signed)
Dr Jillyn Hidden at bedside.  Order received to give albumin 5% 250

## 2015-02-15 NOTE — H&P (Signed)
The patient is a 74 year old female who presents with colorectal cancer. 74 year old female who presents to the office with a large right-sided colonic mass concerning for colon cancer. Patient has a history of a recent blood transfusion due to anemia and positive FOBT. She underwent a CT scan of the abdomen and pelvis on 12/19/2014. This showed a large ascending colon mass without any obvious signs of obstruction. Colonoscopy was performed on 12/21/2014. This was done by Dr. Laurence Spates. An ulcerated completely obstructing large mass was found in the rectosigmoid colon. He was unable to evaluate the right side of the colon. She continues to have blood in her stools. She is losing weight (~25lbs). Her CEA is elevated at 7.4. Her CT scan of the abdomen shows no signs of metastatic disease. Her CT scan of the chest is pending.   Other Problems Elbert Ewings, CMA; 12/25/2014 2:02 PM) High blood pressure  Past Surgical History Elbert Ewings, CMA; 12/25/2014 2:02 PM) No pertinent past surgical history  Diagnostic Studies History Elbert Ewings, [place]; 12/25/2014 2:02 PM) Colonoscopy within last year  Allergies Elbert Ewings, CMA; 12/25/2014 2:03 PM) No Known Drug Allergies 12/25/2014  Medication History Elbert Ewings, CMA; 12/25/2014 2:03 PM) AmLODIPine Besylate (10MG  Tablet, Oral) Active. Ciprofloxacin HCl (500MG  Tablet, Oral) Active. Triamterene-HCTZ (37.5-25MG  Tablet, Oral) Active. MiraLax (Oral) Active. Ferrous Sulfate (250MG  Capsule, Oral) Active. Medications Reconciled  Social History Elbert Ewings, [place]; 12/25/2014 2:02 PM) Alcohol use Remotely quit alcohol use. Caffeine use Carbonated beverages, Coffee, Tea. Tobacco use Former smoker.     Review of Systems  HEENT Present- Sore Throat and Wears glasses/contact lenses. Not Present- Earache, Hearing Loss, Hoarseness, Nose Bleed, Oral Ulcers, Ringing in the Ears, Seasonal Allergies, Sinus Pain, Visual Disturbances  and Yellow Eyes. Gastrointestinal Present- Bloody Stool and Difficulty Swallowing. Not Present- Abdominal Pain, Bloating, Change in Bowel Habits, Chronic diarrhea, Constipation, Excessive gas, Gets full quickly at meals, Hemorrhoids, Indigestion, Nausea, Rectal Pain and Vomiting. Female Genitourinary Not Present- Frequency, Nocturia, Painful Urination, Pelvic Pain and Urgency. Musculoskeletal Not Present- Back Pain, Joint Pain, Joint Stiffness, Muscle Pain, Muscle Weakness and Swelling of Extremities. Neurological Not Present- Decreased Memory, Fainting, Headaches, Numbness, Seizures, Tingling, Tremor, Trouble walking and Weakness. Hematology Not Present- Easy Bruising, Excessive bleeding, Gland problems, HIV and Persistent Infections.  BP 135/64 mmHg  Pulse 56  Temp(Src) 97.6 F (36.4 C) (Oral)  Resp 16  Ht 5' 2.5" (1.588 m)  Wt 49.442 kg (109 lb)  BMI 19.61 kg/m2  SpO2 92%    Physical Exam   General Mental Status-Alert. General Appearance-Not in acute distress. Build & Nutrition-Well nourished. Posture-Normal posture. Gait-Normal.  Head and Neck Head-normocephalic, atraumatic with no lesions or palpable masses. Trachea-midline.  Chest and Lung Exam Chest and lung exam reveals -on auscultation, normal breath sounds, no adventitious sounds and normal vocal resonance.  Cardiovascular Cardiovascular examination reveals -normal heart sounds, regular rate and rhythm with no murmurs and femoral artery auscultation bilaterally reveals normal pulses, no bruits, no thrills.  Abdomen Inspection Inspection of the abdomen reveals - No Hernias. Palpation/Percussion Palpation and Percussion of the abdomen reveal - Soft, Non Tender, No Rigidity (guarding), No hepatosplenomegaly and No Palpable abdominal masses.  Neurologic Neurologic evaluation reveals -alert and oriented x 3 with no impairment of recent or remote memory, normal attention span and ability to  concentrate, normal sensation and normal coordination.  Musculoskeletal Normal Exam - Bilateral-Upper Extremity Strength Normal and Lower Extremity Strength Normal.    Assessment & Plan   MASS OF COLON (  K63.9) Impression: 74 year old female in good health who presents to the office with anemia. It appears she had a CT scan which showed a large right-sided mass concerning for colon cancer. Last week she underwent a colonoscopy with Dr. Oletta Lamas which showed an obstructing rectosigmoid mass at 15 cm. The rest of her colon was unable to be examined. On exam today her abdomen is soft and nondistended. She has no signs of obstruction. She is losing weight. She continues to have rectal bleeding. I discussed this with Dr. Oletta Lamas and he will bring her back for tattooing of the rectosigmoid lesion. We will then plan on doing a minimally invasive right colectomy and sigmoidectomy combined. We discussed doing a total colectomy with ileorectal anastomosis as well. We discussed the risks and benefits of this, which include need for additional screening in the future and possible need for additional surgery as well as the risks assumed with 2 anastomosis instead of one. Benfits mainly include having her remaining colon to hopefully prevent chronic diarrhea.  The surgery and anatomy were described to the patient as well as the risks of surgery and the possible complications. These include: Bleeding, deep abdominal infections and possible wound complications such as hernia and infection, damage to adjacent structures, leak of surgical connections, which can lead to other surgeries and possibly an ostomy, possible need for other procedures, such as abscess drains in radiology, possible prolonged hospital stay, possible diarrhea from removal of part of the colon, possible constipation from narcotics, possible bowel, bladder or sexual dysfunction if having rectal surgery, prolonged fatigue/weakness or appetite loss,  possible early recurrence of of disease, possible complications of their medical problems such as heart disease or arrhythmias or lung problems, death (less than 1%). I believe the patient understands and wishes to proceed with the surgery.

## 2015-02-15 NOTE — Op Note (Signed)
02/15/2015  10:54 AM  PATIENT:  Olivia Jackson  74 y.o. female  Patient Care Team: L.Donnie Coffin, MD as PCP - General (Family Medicine)  PRE-OPERATIVE DIAGNOSIS:  ascending colon cancer, rectosigmoid mass  POST-OPERATIVE DIAGNOSIS:  ascending colon cancer, rectosigmoid mass  PROCEDURE:   LAPAROSCOPIC RIGHT COLECTOMY AND SIGMOIDECTOMY    Surgeon(s): Leighton Ruff, MD  ASSISTANT: Jaymes Graff   ANESTHESIA:   local and general  EBL: 39ml  Total I/O In: 2170 [I.V.:1500; Blood:670] Out: 325 [Urine:25; Blood:300]  SPECIMEN:  Source of Specimen:  R colon, Sigmoid colon  DISPOSITION OF SPECIMEN:  PATHOLOGY  COUNTS:  YES  PLAN OF CARE: Admit to inpatient   PATIENT DISPOSITION:  PACU - hemodynamically stable.   INDICATIONS: This is a 74 y.o. female who presented to my office with a R sided colon mass and elevated CEA.  A rectosigmoid tumor was also found on endoscopic evaluation and unable to pass the scope past this. The risk and benefits and alternative treatments were explained to the patient prior to the OR and the patient has elected to proceed with a laparoscopic right colectomy combined with sigmoidectomy.  Consent was signed and placed on chart prior to the OR.   OR FINDINGS: R colon mass, Rectosigmoid mass, Significant pelvic adhesions, no signs of metastatic disease  DESCRIPTION:  The patient was identified & brought into the operating room. The patient was positioned supine with both arms tucked. SCDs were active during the entire case. The patient underwent general anesthesia without any difficulty. A foley catheter was inserted under sterile conditions. The abdomen was prepped and draped in a sterile fashion. A Surgical Timeout confirmed our plan.  I made a vertical incision around the umbilicus. I dissected down through the subcutaneous tissues using cautery and divided the fascia.  Blunt dissection was used to obtain peritoneal entry. I placed an [place] wound  protector and cap. We induced carbon dioxide insufflation. Camera inspection revealed no injury. I placed additional ports under direct laparoscopic visualization.  I evaluated the entire abdomen laparoscopically.  The liver appeared normal, the large and small bowel were normal as well.  There were no signs of metastatic disease.  I began by identifying the ileocolic artery and vein within the mesentery. Dissection was bluntly carried around these structures. The duodenum was identified and free from the structures. I then separated the structures bluntly and used the Enseal device to transect these separately.  I developed the retroperitoneal plane bluntly.  I then freed the appendix off its attachments to the pelvic wall. I mobilized the terminal ileum.  I took care to avoid injuring any retroperitoneal structures.  After this I began to mobilize laterally down the white line of Toldt and then took down the hepatic flexure using the Enseal device. I mobilized the omentum off of the right transverse colon. The entire colon was then flipped medially and mobilized off of the retroperitoneal structures until I could visualize the lateral edge of the duodenum underneath.  I gently freed the duodenal attachments.  At that point, I removed the cap and desufflated the abdomen.  The terminal ileum and right colon were then removed from the wound. The terminal ileum was transected using a GIA blue load stapler. The remaining mesentery was divided using the Enseal device. I identified a portion of the transverse colon just distal to the hepatic flexure. This was transected using another blue load GIA stapler.  The right branch of the middle colic artery was suture ligated. The  specimen was sent off the field.  An anastomosis was created between the terminal ileum and the transverse colon. This was done using a GIA blue load stapler.  The common enterotomy channel was closed using a TA 60 blue load stapler. Hemostasis was  good at the staple line. Several 3-0 silk sutures were used to imbricate the edge of the anastomosis. An anti-tension suture was placed in the crotch of the anastomosis. This was then placed back into the abdomen.   The cap was replaced and the abdomen was re-insufflated.  I placed a 57mm port in the RLQ and relocated the 25mm port to the RUQ under direct visualization.  The sigmoid colon was significantly adherent to the pelvis on the right and left sides.  I carefully mobilized the right side after identifying the right ureter.  I then freed the anterior adhesions to the ovaries and uterus.  At that point I was able to elevate the sigmoid mesentery and enetered into the retro-mesenteric plane. We were able to identify the left ureter as well lateral to the inflammation.  We kept those posterior within the retroperitoneum and elevated the left colon mesentery off that. I did isolated IMA pedicle but did not ligate it yet.  I continued distally and got into the avascular plane posterior to the mesorectum. This allowed me to help mobilize the rectum as well by freeing the mesorectum off the sacrum.  I dissected below the tattoo, which was at the rectosigmoid junction.  I mobilized the peritoneal coverings towards the peritoneal reflection on both the right and left sides of the rectum.  I could see the right and left ureters and stayed away from them.    I skeletonized the inferior mesenteric artery pedicle.  I went down to its takeoff from the aorta.  After confirming the left ureter was out of the way, I went ahead and ligated the inferior mesenteric artery pedicle with bipolar EnSeal ~2cm above its takeoff from the aorta.  We ensured hemostasis. I skeletonized the mesorectum at the junction at the proximal rectum using blunt dissection & bipolar EnSeal.  I stapled the rectosigmoid with a 73mm purple load Covidian stapler.  I mobilized the left colon in a lateral to medial fashion off the line of Toldt up  towards the splenic flexure to ensure good mobilization of the left colon to reach into the pelvis.  I then removed the cap once again and placed a purse string device on the descending colon.  I placed a 2-0 prolene suture and divided the colon with a scalpel.  This was sent to pathology as a separate specimen.  I tied the purse string around a 92mm EEA anvil.  The cap was then replaced and the EEA anastomosis was created laparoscopically.  There was no tension on the anastomosis.  There was no leak when insufflated under water.  The anastomosis rests and ~11cm from the anal verge.    After this, we irrigated the abdomen.  I placed a 57F blake drain in the pelvis around my sigmoid anastomosis.  This was brought out through the RUQ port site and sutured into placed with a 2-0 nylon suture.  The omentum was then brought down over the bowel.  Hemostasis was good.  We then switched to clean gown, gloves, drapes and instruments.   The fascia was then closed using #0 Novafil interrupted sutures, and the subcutaneous tissue of the umbilical incision was closed using interrupted 2-0 Vicryl sutures. The skin was then  closed using 4-0 Vicryl sutures. Dermabond was placed on the port sites and a sterile dressing was placed over the abdominal incision. All counts were correct per operating room staff. The patient was then awakened from anesthesia and sent to the post anesthesia care unit in stable condition.

## 2015-02-15 NOTE — Anesthesia Preprocedure Evaluation (Addendum)
Anesthesia Evaluation  Patient identified by MRN, date of birth, ID band Patient awake    Reviewed: Allergy & Precautions, H&P , NPO status , Patient's Chart, lab work & pertinent test results  History of Anesthesia Complications Negative for: history of anesthetic complications  Airway Mallampati: I  TM Distance: >3 FB Neck ROM: full    Dental  (+) Edentulous Upper, Edentulous Lower   Pulmonary former smoker,    Pulmonary exam normal breath sounds clear to auscultation       Cardiovascular hypertension, Pt. on medications Normal cardiovascular exam Rhythm:regular Rate:Normal     Neuro/Psych negative neurological ROS     GI/Hepatic negative GI ROS, Neg liver ROS,   Endo/Other  negative endocrine ROS  Renal/GU negative Renal ROS     Musculoskeletal   Abdominal   Peds  Hematology  (+) anemia ,   Anesthesia Other Findings NPO appropriate s/p bowel prep, patient stats she has not eaten for 2 days, no anesthesia problems in past  Hgb on istat is 9.2 which based on physical exam and history is likely very intravascularly contracted and hypovolemic. I would recommend 1u RBC following induction at start of procedure.   Reproductive/Obstetrics negative OB ROS                          Anesthesia Physical Anesthesia Plan  ASA: III  Anesthesia Plan: General   Post-op Pain Management:    Induction: Intravenous  Airway Management Planned: Oral ETT  Additional Equipment:   Intra-op Plan:   Post-operative Plan: Extubation in OR  Informed Consent: I have reviewed the patients History and Physical, chart, labs and discussed the procedure including the risks, benefits and alternatives for the proposed anesthesia with the patient or authorized representative who has indicated his/her understanding and acceptance.   Dental Advisory Given  Plan Discussed with: Anesthesiologist, CRNA and  Surgeon  Anesthesia Plan Comments: (2 PIVs please GA with ETT )      Anesthesia Quick Evaluation

## 2015-02-15 NOTE — Anesthesia Procedure Notes (Signed)
Procedure Name: Intubation Date/Time: 02/15/2015 7:41 AM Performed by: Lajuana Carry E Pre-anesthesia Checklist: Patient identified, Emergency Drugs available, Suction available and Patient being monitored Patient Re-evaluated:Patient Re-evaluated prior to inductionOxygen Delivery Method: Circle System Utilized Preoxygenation: Pre-oxygenation with 100% oxygen Intubation Type: IV induction Ventilation: Mask ventilation without difficulty Laryngoscope Size: Miller and 2 Grade View: Grade I Tube type: Oral Tube size: 7.0 mm Number of attempts: 1 Placement Confirmation: ETT inserted through vocal cords under direct vision,  positive ETCO2 and breath sounds checked- equal and bilateral Secured at: 19 cm Tube secured with: Tape Dental Injury: Teeth and Oropharynx as per pre-operative assessment

## 2015-02-16 LAB — CBC
HCT: 30 % — ABNORMAL LOW (ref 36.0–46.0)
Hemoglobin: 10 g/dL — ABNORMAL LOW (ref 12.0–15.0)
MCH: 25.6 pg — ABNORMAL LOW (ref 26.0–34.0)
MCHC: 33.3 g/dL (ref 30.0–36.0)
MCV: 76.9 fL — ABNORMAL LOW (ref 78.0–100.0)
PLATELETS: 497 10*3/uL — AB (ref 150–400)
RBC: 3.9 MIL/uL (ref 3.87–5.11)
RDW: 15.4 % (ref 11.5–15.5)
WBC: 11.7 10*3/uL — ABNORMAL HIGH (ref 4.0–10.5)

## 2015-02-16 LAB — BASIC METABOLIC PANEL
Anion gap: 11 (ref 5–15)
BUN: 9 mg/dL (ref 6–20)
CALCIUM: 8.5 mg/dL — AB (ref 8.9–10.3)
CO2: 24 mmol/L (ref 22–32)
Chloride: 101 mmol/L (ref 101–111)
Creatinine, Ser: 0.82 mg/dL (ref 0.44–1.00)
GFR calc Af Amer: >60 mL/min (ref >60)
GLUCOSE: 99 mg/dL (ref 65–99)
Potassium: 3.2 mmol/L — ABNORMAL LOW (ref 3.5–5.1)
Sodium: 136 mmol/L (ref 135–145)

## 2015-02-16 MED ORDER — KCL IN DEXTROSE-NACL 30-5-0.45 MEQ/L-%-% IV SOLN
INTRAVENOUS | Status: DC
  Administered 2015-02-16 – 2015-02-18 (×3): via INTRAVENOUS
  Filled 2015-02-16 (×6): qty 1000

## 2015-02-16 NOTE — Progress Notes (Signed)
1 Day Post-Op Lap R colectomy and sigmoidectomy for presumed colon cancer, intraop transfusion due to preop acute blood loss anemia Subjective: Doing well today, pain controlled.  Just feels sore.  No nausea.  BP good, UOP good  Objective: Vital signs in last 24 hours: Temp:  [96.2 F (35.7 C)-98.7 F (37.1 C)] 98.7 F (37.1 C) (12/16 0943) Pulse Rate:  [77-93] 85 (12/16 0943) Resp:  [4-24] 18 (12/16 0943) BP: (80-131)/(49-72) 120/50 mmHg (12/16 0943) SpO2:  [100 %] 100 % (12/16 0943) Weight:  [49.442 kg (109 lb)] 49.442 kg (109 lb) (12/15 1256)   Intake/Output from previous day: 12/15 0701 - 12/16 0700 In: [id number] [I.V.:2705; Blood:670; IV [id number] Out: 2365 [id number]; Drains:140; Blood:300] Intake/Output this shift: Total I/O In: -  Out: 120 [Urine:100; Drains:20]   General appearance: alert and cooperative GI: normal findings: soft, appropriately tender JP: SS fluid Incision: slight drainage present on dressing, wound underneath looks fine  Lab Results:   Recent Labs  02/15/15 0723 02/16/15 0433  WBC  --  11.7*  HGB 9.2* 10.0*  HCT 27.0* 30.0*  PLT  --  497*   BMET  Recent Labs  02/15/15 0723 02/16/15 0433  NA 130* 136  K 2.8* 3.2*  CL  --  101  CO2  --  24  GLUCOSE 99 99  BUN  --  9  CREATININE  --  0.82  CALCIUM  --  8.5*   PT/INR No results for input(s): LABPROT, INR in the last 72 hours. ABG No results for input(s): PHART, HCO3 in the last 72 hours.  Invalid input(s): PCO2, PO2  MEDS, Scheduled . acetaminophen  1,000 mg Oral 4 times per day  . alvimopan  12 mg Oral BID  . amLODipine  5 mg Oral Daily  . enoxaparin (LOVENOX) injection  40 mg Subcutaneous Q24H  . triamterene-hydrochlorothiazide  0.5 tablet Oral Daily    Studies/Results: No results found.  Assessment: s/p Procedure(s): LAPAROSCOPIC RIGHT COLECTOMY AND SIGMOIDECTOMY PROCTOSCOPY UPPER GI ENDOSCOPY Patient Active Problem List   Diagnosis Date Noted  . Colon cancer  ([place]) 02/15/2015    Expected post op course  Plan: Advance diet to clears as tolerated, will slowly transition to PO diet Ambulate    LOS: 1 day     .Rosario Adie, [facility], [place]   02/16/2015 10:17 AM

## 2015-02-16 NOTE — Evaluation (Signed)
Physical Therapy Evaluation Patient Details Name: Olivia Jackson MRN: [id number] DOB: 01-03-1941 Today's Date: 02/16/2015   History of Present Illness  74 yo female s/p R colectomy, sigmoidectomy 02/15/15.   Clinical Impression  On eval, pt required Min assist for mobility-walked ~55 feet while holding on to therapist and IV pole. Pt c/o dizziness and is deconditioned. Recommend daily ambulation with nursing and PT will continue to follow as well. At this time, recommend HHPT.    Follow Up Recommendations Home health PT;Supervision/Assistance - 24 hour (may not need if she progresses well during stay)    Equipment Recommendations  None recommended by PT    Recommendations for Other Services       Precautions / Restrictions Precautions Precautions: Fall Precaution Comments: drain R side Restrictions Weight Bearing Restrictions: No      Mobility  Bed Mobility Overal bed mobility: Needs Assistance Bed Mobility: Supine to Sit;Sit to Supine     Supine to sit: Min guard Sit to supine: Min guard;HOB elevated   General bed mobility comments: Increased time. close guard for safety  Transfers Overall transfer level: Needs assistance   Transfers: Sit to/from Stand Sit to Stand: Min assist         General transfer comment: Assist to stabilize. Pt c/o dizziness. Stood while holding onto countertop for a couple of minutes before able to walk  Ambulation/Gait Ambulation/Gait assistance: Museum/gallery curator (Feet): 55 Feet Assistive device:  (IV pole and holding around therapist's waist) Gait Pattern/deviations: Step-through pattern;Decreased stride length     General Gait Details: Assist to stabilize throughout distance. Fatigues fairly easily.   Stairs            Wheelchair Mobility    Modified Rankin (Stroke Patients Only)       Balance Overall balance assessment: Needs assistance         Standing balance support: During functional  activity Standing balance-Leahy Scale: Fair                               Pertinent Vitals/Pain Pain Assessment: 0-10 Pain Score: 4  Pain Location: abdomen Pain Descriptors / Indicators: Sore Pain Intervention(s): Premedicated before session    Home Living Family/patient expects to be discharged to:: Private residence Living Arrangements: Spouse/significant other   Type of Home: House Home Access: Stairs to enter Entrance Stairs-Rails: Right Entrance Stairs-Number of Steps: 6 Home Layout: One level Home Equipment: None      Prior Function Level of Independence: Independent               Hand Dominance        Extremity/Trunk Assessment   Upper Extremity Assessment: Generalized weakness           Lower Extremity Assessment: Generalized weakness      Cervical / Trunk Assessment: Normal  Communication   Communication: No difficulties  Cognition Arousal/Alertness: Awake/alert Behavior During Therapy: WFL for tasks assessed/performed Overall Cognitive Status: Within Functional Limits for tasks assessed                      General Comments      Exercises        Assessment/Plan    PT Assessment Patient needs continued PT services  PT Diagnosis Difficulty walking;Generalized weakness;Acute pain   PT Problem List Decreased strength;Decreased activity tolerance;Decreased balance;Decreased mobility;Decreased knowledge of use of DME;Pain  PT Treatment Interventions Gait training;Functional mobility training;Therapeutic  activities;Patient/family education;Balance training;Therapeutic exercise   PT Goals (Current goals can be found in the Care Plan section) Acute Rehab PT Goals Patient Stated Goal: home by next week PT Goal Formulation: With patient Time For Goal Achievement: 03/02/15 Potential to Achieve Goals: Good    Frequency Min 3X/week   Barriers to discharge        Co-evaluation               End of Session    Activity Tolerance: Patient limited by fatigue Patient left: in bed;with call bell/phone within reach;with bed alarm set           Time: 1351-1411 PT Time Calculation (min) (ACUTE ONLY): 20 min   Charges:   PT Evaluation $Initial PT Evaluation Tier I: 1 Procedure     PT G Codes:        Weston Anna, MPT Pager: [phone]

## 2015-02-17 LAB — CBC
HCT: 30.2 % — ABNORMAL LOW (ref 36.0–46.0)
Hemoglobin: 9.9 g/dL — ABNORMAL LOW (ref 12.0–15.0)
MCH: 25.6 pg — AB (ref 26.0–34.0)
MCHC: 32.8 g/dL (ref 30.0–36.0)
MCV: 78 fL (ref 78.0–100.0)
PLATELETS: 487 10*3/uL — AB (ref 150–400)
RBC: 3.87 MIL/uL (ref 3.87–5.11)
RDW: 15.8 % — AB (ref 11.5–15.5)
WBC: 11.5 10*3/uL — AB (ref 4.0–10.5)

## 2015-02-17 LAB — BASIC METABOLIC PANEL
Anion gap: 5 (ref 5–15)
BUN: 7 mg/dL (ref 6–20)
CALCIUM: 8.2 mg/dL — AB (ref 8.9–10.3)
CHLORIDE: 103 mmol/L (ref 101–111)
CO2: 28 mmol/L (ref 22–32)
CREATININE: 0.65 mg/dL (ref 0.44–1.00)
GFR calc Af Amer: >60 mL/min (ref >60)
GFR calc non Af Amer: >60 mL/min (ref >60)
Glucose, Bld: 93 mg/dL (ref 65–99)
Potassium: 3.6 mmol/L (ref 3.5–5.1)
SODIUM: 136 mmol/L (ref 135–145)

## 2015-02-17 NOTE — Consult Note (Signed)
General Surgery Note  LOS: 2 days  POD -  2 Days Post-Op  Assessment/Plan: 1.  LAPAROSCOPIC RIGHT COLECTOMY AND SIGMOIDECTOMY, PROCTOSCOPY, UPPER GI ENDOSCOPY - 02/15/2015 - A. Thomas  Doing okay.  No flatus yet.  Needs to be more active  2.  HTN 3.  DVT prophylaxis - Lovenox 4.  Foley out today  Active Problems:   Colon cancer ([place])  Subjective:  Feels better.  Needs to move more. Objective:   Filed Vitals:   02/16/15 2059 02/17/15 0520  BP: 75/48 119/58  Pulse: 84 73  Temp: 98.8 F (37.1 C) 98.4 F (36.9 C)  Resp: 18 18     Intake/Output from previous day:  12/16 0701 - 12/17 0700 In: [id number] [P.O.:120; I.V.:1450] Out: [id number] [Urine:2300; Drains:155]  Intake/Output this shift:      Physical Exam:   General: Thin older AA F who is alert and oriented.    HEENT: Normal. Pupils equal. .   Lungs: Clear   Abdomen: Soft, but quiet   Wound: Dressing.  Drain - 155 cc     Lab Results:    Recent Labs  02/16/15 0433 02/17/15 0513  WBC 11.7* 11.5*  HGB 10.0* 9.9*  HCT 30.0* 30.2*  PLT 497* 487*    BMET   Recent Labs  02/16/15 0433 02/17/15 0513  NA 136 136  K 3.2* 3.6  CL 101 103  CO2 24 28  GLUCOSE 99 93  BUN 9 7  CREATININE 0.82 0.65  CALCIUM 8.5* 8.2*    PT/INR  No results for input(s): LABPROT, INR in the last 72 hours.  ABG  No results for input(s): PHART, HCO3 in the last 72 hours.  Invalid input(s): PCO2, PO2   Studies/Results:  No results found.   Anti-infectives:   Anti-infectives    Start     Dose/Rate Route Frequency Ordered Stop   02/15/15 2000  cefoTEtan (CEFOTAN) 2 g in dextrose 5 % 50 mL IVPB     2 g 100 mL/hr over 30 Minutes Intravenous Every 12 hours 02/15/15 1317 02/15/15 2032   02/15/15 0512  cefoTEtan (CEFOTAN) 2 g in dextrose 5 % 50 mL IVPB     2 g 100 mL/hr over 30 Minutes Intravenous On call to O.R. 02/15/15 0512 02/15/15 0810      Alphonsa Overall, MD, FACS Pager: [facility] Office:  [phone] 02/17/2015

## 2015-02-18 LAB — CBC
HCT: 31.6 % — ABNORMAL LOW (ref 36.0–46.0)
Hemoglobin: 10.2 g/dL — ABNORMAL LOW (ref 12.0–15.0)
MCH: 25.4 pg — ABNORMAL LOW (ref 26.0–34.0)
MCHC: 32.3 g/dL (ref 30.0–36.0)
MCV: 78.6 fL (ref 78.0–100.0)
PLATELETS: 479 10*3/uL — AB (ref 150–400)
RBC: 4.02 MIL/uL (ref 3.87–5.11)
RDW: 15.9 % — ABNORMAL HIGH (ref 11.5–15.5)
WBC: 9.1 10*3/uL (ref 4.0–10.5)

## 2015-02-18 LAB — BASIC METABOLIC PANEL
Anion gap: 6 (ref 5–15)
BUN: <5 mg/dL — ABNORMAL LOW (ref 6–20)
CHLORIDE: 103 mmol/L (ref 101–111)
CO2: 28 mmol/L (ref 22–32)
CREATININE: 0.64 mg/dL (ref 0.44–1.00)
Calcium: 8.3 mg/dL — ABNORMAL LOW (ref 8.9–10.3)
GFR calc non Af Amer: >60 mL/min (ref >60)
Glucose, Bld: 95 mg/dL (ref 65–99)
Potassium: 3.9 mmol/L (ref 3.5–5.1)
SODIUM: 137 mmol/L (ref 135–145)

## 2015-02-18 NOTE — Progress Notes (Signed)
General Surgery Note  LOS: 3 days  POD -  3 Days Post-Op  Assessment/Plan: 1. LAPAROSCOPIC RIGHT COLECTOMY AND SIGMOIDECTOMY, PROCTOSCOPY, UPPER GI ENDOSCOPY - 02/15/2015 - A. Thomas Doing okay.  On clear liquids  Still needs to be more active  2. HTN 3. DVT prophylaxis - Lovenox 4.  Voiding okay    Active Problems:   Colon cancer ([facility])   Subjective:  Taking liquids.  Maybe a small BM, no flatus.  Moving around room, but has not walked in hall. Objective:   Filed Vitals:   02/17/15 2223 02/18/15 0555  BP: 121/56 122/59  Pulse: 78 79  Temp: 99 F (37.2 C) 98.1 F (36.7 C)  Resp: 16 16     Intake/Output from previous day:  12/17 0701 - 12/18 0700 In: 1320 [P.O.:120; I.V.:1200] Out: 115.3 [Drains:115.3]  Intake/Output this shift:      Physical Exam:   General: WN older AA F who is alert and oriented.    HEENT: Normal. Pupils equal. .   Lungs: clear   Abdomen: rare BS.  Soft   Wound: Clean.   Drain - 110 cc recorded last 24 hours   Lab Results:    Recent Labs  02/17/15 0513 02/18/15 0543  WBC 11.5* 9.1  HGB 9.9* 10.2*  HCT 30.2* 31.6*  PLT 487* 479*    BMET   Recent Labs  02/17/15 0513 02/18/15 0543  NA 136 137  K 3.6 3.9  CL 103 103  CO2 28 28  GLUCOSE 93 95  BUN 7 <5*  CREATININE 0.65 0.64  CALCIUM 8.2* 8.3*    PT/INR  No results for input(s): LABPROT, INR in the last 72 hours.  ABG  No results for input(s): PHART, HCO3 in the last 72 hours.  Invalid input(s): PCO2, PO2   Studies/Results:  No results found.   Anti-infectives:   Anti-infectives    Start     Dose/Rate Route Frequency Ordered Stop   02/15/15 2000  cefoTEtan (CEFOTAN) 2 g in dextrose 5 % 50 mL IVPB     2 g 100 mL/hr over 30 Minutes Intravenous Every 12 hours 02/15/15 1317 02/15/15 2032   02/15/15 0512  cefoTEtan (CEFOTAN) 2 g in dextrose 5 % 50 mL IVPB     2 g 100 mL/hr over 30 Minutes Intravenous On call to O.R. 02/15/15 0512 02/15/15 0810       Alphonsa Overall, MD, FACS Pager: [facility] Office: [phone] 02/18/2015

## 2015-02-19 LAB — TYPE AND SCREEN
ABO/RH(D): B POS
Antibody Screen: NEGATIVE
UNIT DIVISION: 0
Unit division: 0
Unit division: 0
Unit division: 0

## 2015-02-19 MED ORDER — HYDROCODONE-ACETAMINOPHEN 5-325 MG PO TABS: 1.0000 | ORAL_TABLET | ORAL | Status: DC | PRN

## 2015-02-19 NOTE — Progress Notes (Signed)
Physical Therapy Treatment Patient Details Name: DERION MALACH MRN: [id number] DOB: [birth year] Today's Date: 02/19/2015    History of Present Illness 74 yo female s/p R colectomy, sigmoidectomy 02/15/15.     PT Comments    Assisted OOB to amb a greater distance in hallway.  Slow but steady gait with c/o ABD "tightness", "sore".    Follow Up Recommendations  No PT follow up (pt progressing well)     Equipment Recommendations  None recommended by PT    Recommendations for Other Services       Precautions / Restrictions Precautions Precautions: Fall Precaution Comments: drain R side Restrictions Weight Bearing Restrictions: No    Mobility  Bed Mobility Overal bed mobility: Needs Assistance Bed Mobility: Supine to Sit     Supine to sit: Supervision;Min guard     General bed mobility comments: Increased time. close guard for safety  Transfers Overall transfer level: Needs assistance   Transfers: Sit to/from Stand Sit to Stand: Supervision;Min guard         General transfer comment: <25% VC's on proper hand placement and increased time  Ambulation/Gait Ambulation/Gait assistance: Supervision;Min guard Ambulation Distance (Feet): 500 Feet Assistive device: Rolling walker (2 wheeled) Gait Pattern/deviations: Step-through pattern;Decreased stride length;Trunk flexed Gait velocity: decreased   General Gait Details: increased time.  Tolerated increased distance   Financial trader Rankin (Stroke Patients Only)       Balance                                    Cognition Arousal/Alertness: Awake/alert Behavior During Therapy: WFL for tasks assessed/performed Overall Cognitive Status: Within Functional Limits for tasks assessed                      Exercises      General Comments        Pertinent Vitals/Pain Pain Assessment: Faces Faces Pain Scale: Hurts a little bit Pain  Location: ABd Pain Descriptors / Indicators: Sore;Tender Pain Intervention(s): Monitored during session;Repositioned    Home Living                      Prior Function            PT Goals (current goals can now be found in the care plan section) Progress towards PT goals: Progressing toward goals    Frequency  Min 3X/week    PT Plan      Co-evaluation             End of Session Equipment Utilized During Treatment: Gait belt Activity Tolerance: Patient tolerated treatment well Patient left: in chair;with call bell/phone within reach     Time: 0925-0950 PT Time Calculation (min) (ACUTE ONLY): 25 min  Charges:  $Gait Training: 8-22 mins $Therapeutic Activity: 8-22 mins                    G Codes:      Rica Koyanagi  PTA [facility]  Acute  Rehab Pager      [phone]

## 2015-02-19 NOTE — Care Management Note (Signed)
Case Management Note  Patient Details  Name: Olivia Jackson MRN: [id number] Date of Birth: 1941/02/01  Subjective/Objective:      4 Days Post-Op Lap R colectomy and sigmoidectomy for presumed colon cancer              Action/Plan: Discharge planning, spoke with patient at bedside. Discussed recommendations for HHPT. Patient states she feels she is at baseline, has assistance from family at home, does not feel she would benefit from any therapy. I observed patient ambulating in the hall with PT and i agree she looks quite stable. She was ambulating with a walker without further assistance and lapped the unit. Will continue to follow if she should change her mind.   Expected Discharge Date:                  Expected Discharge Plan:  Home/Self Care  In-House Referral:  NA  Discharge planning Services  CM Consult  Post Acute Care Choice:  NA Choice offered to:  Patient  DME Arranged:  N/A DME Agency:  NA  HH Arranged:  NA HH Agency:  NA  Status of Service:  Completed, signed off  Medicare Important Message Given:  Yes Date Medicare IM Given:    Medicare IM give by:    Date Additional Medicare IM Given:    Additional Medicare Important Message give by:     If discussed at [place] of Stay Meetings, dates discussed:    Additional Comments:  Guadalupe Maple, RN 02/19/2015, 1:02 PM

## 2015-02-19 NOTE — Discharge Instructions (Signed)

## 2015-02-19 NOTE — Care Management Important Message (Signed)
Important Message  Patient Details  Name: Olivia Jackson MRN: [id number] Date of Birth: 01-Mar-1941   Medicare Important Message Given:  Yes    Camillo Flaming 02/19/2015, 12:20 [place] Message  Patient Details  Name: Olivia Jackson MRN: [id number] Date of Birth: [birth year]   Medicare Important Message Given:  Yes    Camillo Flaming 02/19/2015, 12:20 PM

## 2015-02-19 NOTE — Progress Notes (Signed)
4 Days Post-Op Lap R colectomy and sigmoidectomy for presumed colon cancer, intraop transfusion due to preop acute blood loss anemia Subjective: Doing well today, pain controlled. Tolerating clears.  Had a large BM.  Ambulating some  Objective: Vital signs in last 24 hours: Temp:  [97.7 F (36.5 C)-98.8 F (37.1 C)] 98.7 F (37.1 C) (12/19 0515) Pulse Rate:  [87-101] 87 (12/19 0515) Resp:  [16-18] 18 (12/19 0515) BP: (109-144)/(45-71) 109/45 mmHg (12/19 0515) SpO2:  [98 %-100 %] 98 % (12/19 0515)   Intake/Output from previous day: 12/18 0701 - 12/19 0700 In: 1200 [I.V.:1200] Out: 65 [Drains:65] Intake/Output this shift:  General appearance: alert and cooperative GI: normal findings: soft, appropriately tender JP: SS fluid Incision: slight drainage present on dressing, wound underneath looks fine  Lab Results:   Recent Labs  02/17/15 0513 02/18/15 0543  WBC 11.5* 9.1  HGB 9.9* 10.2*  HCT 30.2* 31.6*  PLT 487* 479*   BMET  Recent Labs  02/17/15 0513 02/18/15 0543  NA 136 137  K 3.6 3.9  CL 103 103  CO2 28 28  GLUCOSE 93 95  BUN 7 <5*  CREATININE 0.65 0.64  CALCIUM 8.2* 8.3*   PT/INR No results for input(s): LABPROT, INR in the last 72 hours. ABG No results for input(s): PHART, HCO3 in the last 72 hours.  Invalid input(s): PCO2, PO2  MEDS, Scheduled . amLODipine  5 mg Oral Daily  . enoxaparin (LOVENOX) injection  40 mg Subcutaneous Q24H  . triamterene-hydrochlorothiazide  0.5 tablet Oral Daily    Studies/Results: No results found.  Assessment: s/p Procedure(s): LAPAROSCOPIC RIGHT COLECTOMY AND SIGMOIDECTOMY PROCTOSCOPY UPPER GI ENDOSCOPY Patient Active Problem List   Diagnosis Date Noted  . Colon cancer ([place]) 02/15/2015    Expected post op course  Plan: Advance diet to soft foods KVO IVF's Ambulate PO pain meds D/C JP Anticipate D/C in AM  Ambulate    LOS: 4 days     .Rosario Adie, [facility],  [place]   02/19/2015 7:29 AM

## 2015-02-19 NOTE — Discharge Summary (Signed)
Physician Discharge Summary  Patient ID: Olivia Jackson MRN: [id number] DOB/AGE: [birth year] 74 y.o.  Admit date: 02/15/2015 Discharge date: 02/20/2015  Admission Diagnoses: Colon cancer  Discharge Diagnoses:  Active Problems:   Colon cancer [facility])   Discharged Condition: good  Hospital Course: patient admitted after surgical resection of a right sided colon mass and a sigmoid colon mass.  She slowly began to recover.  Her bowel function returned by POD 3.  Her diet was advanced accordingly.  Her foley was removed on POD 2.  Her JP was removed on POD 4.  By POD 5 she was tolerating a regular diet and PO narcotics.  She was felt to be in stable condition for discharge to home.    Consults: None  Significant Diagnostic Studies: labs: cbc, chem  Treatments: IV hydration, analgesia: acetaminophen w/ codeine and surgery: see above  Discharge Exam: Blood pressure 100/75, pulse 90, temperature 99.4 F (37.4 C), temperature source Oral, resp. rate 16, height 5\' 2"  (1.575 m), weight 49.442 kg (109 lb), SpO2 100 %. General appearance: alert and cooperative GI: normal findings: soft, non-tender Incision/Wound: clean, dry, intact  Disposition: Final discharge disposition not confirmed     Medication List    STOP taking these medications        metroNIDAZOLE 500 MG tablet  Commonly known as:  FLAGYL     neomycin 500 MG tablet  Commonly known as:  MYCIFRADIN      TAKE these medications        acetaminophen 325 MG tablet  Commonly known as:  TYLENOL  Take 2 tablets (650 mg total) by mouth every 6 (six) hours as needed.     amLODipine 10 MG tablet  Commonly known as:  NORVASC  Take 5 mg by mouth daily.     ferrous sulfate 325 (65 FE) MG tablet  Take 325 mg by mouth 2 (two) times daily.     polyethylene glycol packet  Commonly known as:  MIRALAX / GLYCOLAX  Take 17 g by mouth daily as needed for mild constipation, moderate constipation or severe constipation.     triamterene-hydrochlorothiazide 37.5-25 MG tablet  Commonly known as:  MAXZIDE-25  Take 0.5 tablets by mouth daily.       Follow-up Information    Follow up with Rosario Adie., MD. Schedule an appointment as soon as possible for a visit in 2 weeks.   Specialty:  General Surgery   Contact information:   [address] [place] [address] [phone]       Signed: Rosario Adie [id number], 8:14 AM

## 2015-02-20 MED ORDER — ACETAMINOPHEN 325 MG PO TABS: 650.0000 mg | ORAL_TABLET | Freq: Four times a day (QID) | ORAL | Status: AC | PRN

## 2015-02-22 ENCOUNTER — Telehealth: Payer: Self-pay | Admitting: *Deleted

## 2015-02-22 ENCOUNTER — Telehealth: Payer: Self-pay | Admitting: General Surgery

## 2015-02-22 DIAGNOSIS — C182 Malignant neoplasm of ascending colon: Secondary | ICD-10-CM

## 2015-02-22 DIAGNOSIS — C187 Malignant neoplasm of sigmoid colon: Secondary | ICD-10-CM

## 2015-02-22 NOTE — Telephone Encounter (Signed)
Notified pt of path results.  Will make referral to oncology for possible adjuvant treatment

## 2015-02-22 NOTE — Telephone Encounter (Signed)
Oncology Nurse Navigator Documentation  Oncology Nurse Navigator Flowsheets 02/22/2015  Referral date to RadOnc/MedOnc 02/21/2015  Navigator Encounter Type Introductory phone call  Spoke with patient and provided new patient appointment for 03/13/15 at 1:45/2:00 pm with Dr. Benay Spice. Informed of location of [place], valet service, and registration process. Reminded to bring insurance cards and a current medication list, including supplements. Patient verbalizes understanding.

## 2015-03-02 ENCOUNTER — Encounter (HOSPITAL_COMMUNITY): Payer: Self-pay

## 2015-03-07 ENCOUNTER — Encounter (HOSPITAL_COMMUNITY): Payer: Self-pay

## 2015-03-08 ENCOUNTER — Encounter (HOSPITAL_COMMUNITY): Payer: Self-pay

## 2015-03-09 ENCOUNTER — Telehealth: Payer: Self-pay | Admitting: *Deleted

## 2015-03-09 NOTE — Telephone Encounter (Signed)
Oncology Nurse Navigator Documentation  Oncology Nurse Navigator Flowsheets 03/09/2015  Navigator Location [facility] On[facility]  Navigator Encounter Type Telephone  Telephone Incoming Call  Barriers/Navigation Needs Coordination of Care--afraid of "storm" coming and wants to move appointment out  Interventions Coordination of Care--resch her for 03/23/15 in GI [place] at 0900/0845  Coordination of Care Appts--she agrees to this appointment  Time Spent with Patient 15  Explained to her that it is not good to push her out too far. Chemo treatment works better when it can be started within 6 weeks of her surgery.

## 2015-03-13 ENCOUNTER — Ambulatory Visit: Payer: Medicare Other | Admitting: Oncology

## 2015-03-23 ENCOUNTER — Encounter: Payer: Self-pay | Admitting: *Deleted

## 2015-03-23 ENCOUNTER — Telehealth: Payer: Self-pay | Admitting: Hematology

## 2015-03-23 ENCOUNTER — Ambulatory Visit: Payer: Medicare Other | Attending: Hematology | Admitting: Physical Therapy

## 2015-03-23 ENCOUNTER — Ambulatory Visit: Payer: Medicare Other | Admitting: Nutrition

## 2015-03-23 ENCOUNTER — Telehealth: Payer: Self-pay | Admitting: Pharmacist

## 2015-03-23 ENCOUNTER — Ambulatory Visit (HOSPITAL_BASED_OUTPATIENT_CLINIC_OR_DEPARTMENT_OTHER): Payer: Medicare Other | Admitting: Hematology

## 2015-03-23 ENCOUNTER — Encounter: Payer: Self-pay | Admitting: Hematology

## 2015-03-23 ENCOUNTER — Ambulatory Visit (HOSPITAL_BASED_OUTPATIENT_CLINIC_OR_DEPARTMENT_OTHER): Payer: Medicare Other

## 2015-03-23 VITALS — BP 89/50 | HR 64 | Temp 97.7°F | Resp 18 | Ht 62.0 in | Wt 111.9 lb

## 2015-03-23 DIAGNOSIS — R531 Weakness: Secondary | ICD-10-CM | POA: Diagnosis present

## 2015-03-23 DIAGNOSIS — E46 Unspecified protein-calorie malnutrition: Secondary | ICD-10-CM

## 2015-03-23 DIAGNOSIS — D509 Iron deficiency anemia, unspecified: Secondary | ICD-10-CM | POA: Diagnosis not present

## 2015-03-23 DIAGNOSIS — C187 Malignant neoplasm of sigmoid colon: Secondary | ICD-10-CM

## 2015-03-23 DIAGNOSIS — C772 Secondary and unspecified malignant neoplasm of intra-abdominal lymph nodes: Secondary | ICD-10-CM

## 2015-03-23 DIAGNOSIS — I959 Hypotension, unspecified: Secondary | ICD-10-CM

## 2015-03-23 DIAGNOSIS — C182 Malignant neoplasm of ascending colon: Secondary | ICD-10-CM

## 2015-03-23 DIAGNOSIS — D630 Anemia in neoplastic disease: Secondary | ICD-10-CM | POA: Diagnosis not present

## 2015-03-23 DIAGNOSIS — I10 Essential (primary) hypertension: Secondary | ICD-10-CM

## 2015-03-23 DIAGNOSIS — R634 Abnormal weight loss: Secondary | ICD-10-CM

## 2015-03-23 LAB — CBC WITH DIFFERENTIAL/PLATELET
BASO%: 0.9 % (ref 0.0–2.0)
BASOS ABS: 0.1 10*3/uL (ref 0.0–0.1)
EOS ABS: 0.2 10*3/uL (ref 0.0–0.5)
EOS%: 2.2 % (ref 0.0–7.0)
HEMATOCRIT: 37.5 % (ref 34.8–46.6)
HEMOGLOBIN: 11.7 g/dL (ref 11.6–15.9)
LYMPH%: 18.7 % (ref 14.0–49.7)
MCH: 24 pg — AB (ref 25.1–34.0)
MCHC: 31.2 g/dL — ABNORMAL LOW (ref 31.5–36.0)
MCV: 76.8 fL — AB (ref 79.5–101.0)
MONO#: 0.4 10*3/uL (ref 0.1–0.9)
MONO%: 4.7 % (ref 0.0–14.0)
NEUT%: 73.5 % (ref 38.4–76.8)
NEUTROS ABS: 7 10*3/uL — AB (ref 1.5–6.5)
PLATELETS: 456 10*3/uL — AB (ref 145–400)
RBC: 4.88 10*6/uL (ref 3.70–5.45)
RDW: 17.7 % — AB (ref 11.2–14.5)
WBC: 9.5 10*3/uL (ref 3.9–10.3)
lymph#: 1.8 10*3/uL (ref 0.9–3.3)

## 2015-03-23 LAB — COMPREHENSIVE METABOLIC PANEL
ALBUMIN: 3.6 g/dL (ref 3.5–5.0)
ALK PHOS: 66 U/L (ref 40–150)
ALT: 12 U/L (ref 0–55)
ANION GAP: 9 meq/L (ref 3–11)
AST: 23 U/L (ref 5–34)
BUN: 11 mg/dL (ref 7.0–26.0)
CALCIUM: 9.6 mg/dL (ref 8.4–10.4)
CO2: 26 mEq/L (ref 22–29)
Chloride: 103 mEq/L (ref 98–109)
Creatinine: 0.8 mg/dL (ref 0.6–1.1)
EGFR: 82 mL/min/{1.73_m2} — ABNORMAL LOW (ref >90)
GLUCOSE: 73 mg/dL (ref 70–140)
POTASSIUM: 3.7 meq/L (ref 3.5–5.1)
Sodium: 138 mEq/L (ref 136–145)
TOTAL PROTEIN: 8.2 g/dL (ref 6.4–8.3)

## 2015-03-23 LAB — FERRITIN: FERRITIN: 33 ng/mL (ref 9–269)

## 2015-03-23 LAB — IRON AND TIBC
%SAT: 11 % — ABNORMAL LOW (ref 21–57)
IRON: 33 ug/dL — AB (ref 41–142)
TIBC: 310 ug/dL (ref 236–444)
UIBC: 277 ug/dL (ref 120–384)

## 2015-03-23 MED ORDER — CAPECITABINE 500 MG PO TABS: 1000.0000 mg/m2 | ORAL_TABLET | Freq: Two times a day (BID) | ORAL | Status: DC

## 2015-03-23 NOTE — Progress Notes (Signed)
Oncology Nurse Navigator Documentation  Oncology Nurse Navigator Flowsheets 03/23/2015  Navigator Location [facility]  Navigator Encounter Type Clinic/MDC  Telephone -  Abnormal Finding Date 12/21/2014  Confirmed Diagnosis Date 12/21/2014  Surgery Date 02/15/2015  Patient Visit Type MedOnc  Treatment Phase Pre-Tx/Tx Discussion  Barriers/Navigation Needs Education  Education Understanding Cancer/ Treatment Options;Symptom Management;Newly Diagnosed Cancer Education  Interventions Education Method  Coordination of Care -  Education Method Verbal;Written;Teach-back  Support Groups/Services Other-ACS for Set designer; seen by CSW, Dietician, and PT  Acuity Level 1  Time Spent with Patient 47  Met with patient and husband, [name] during new patient visit. Explained the role of the GI Nurse Navigator and provided New Patient Packet with information on: 1. Colon cancer--oral drug Xeloda fact sheet 2. Support groups 3. Advanced Directives 4. Fall Safety Plan Answered questions, reviewed current treatment plan using TEACH back and provided emotional support. Provided copy of current treatment plan. Escorted to scheduler and lab. Script for Xeloda taken to oral chemo pharmacist. Merceda Elks, RN, BSN GI Oncology [place]

## 2015-03-23 NOTE — Telephone Encounter (Signed)
per pof o sch pt appt-gave pt copy of avs

## 2015-03-23 NOTE — Progress Notes (Signed)
Patient was seen in GI clinic  75 year old female diagnosed with stage III colon cancer status post surgical resection.  She is a patient of Dr. Burr Medico.  Past medical history includes hypertension and anemia.  Medications include MiraLAX.  Height: 62 inches. Weight: 111.9 pounds. Usual body weight: 117 pounds October 2016 BMI 20.46.  Patient reports she is feeling well. Reports appetite and oral intake have improved tremendously. Denies problems with chewing, swallowing, nausea and vomiting. Patient states she is working hard to gain weight.  Nutrition diagnosis: Food and nutrition related knowledge deficit related to new diagnosis of colon cancer as evidenced by no prior need for nutrition related information.  Intervention:  Patient educated to consume small frequent meals and snacks with high-calorie, high-protein foods. Provided fact sheet on increasing calories and protein. Recommended patient consume oral nutrition supplements as desired. Questions were answered.  Teach back method used.  Contact information was given.  Monitoring, evaluation, goals: Patient will tolerate increased calories and protein to promote weight gain.  Next visit: Patient will contact me with questions or concerns.  **Disclaimer: This note was dictated with voice recognition software. Similar sounding words can inadvertently be transcribed and this note may contain transcription errors which may not have been corrected upon publication of note.**

## 2015-03-23 NOTE — Therapy (Signed)
[facility] [address] [place], [place], [address] Phone: [phone]   Fax:  [phone]  Physical Therapy Evaluation  Patient Details  Name: Olivia Jackson MRN: [id number] Date of Birth: 03/02/1941 Referring Provider: Dr Burr Medico  Encounter Date: 03/23/2015      PT End of Session - 03/23/15 1015    Visit Number 1   PT Start Time 0940   PT Stop Time 1000   PT Time Calculation (min) 20 min   Activity Tolerance Patient tolerated treatment well   Behavior During Therapy [facility] for tasks assessed/performed      Past Medical History  Diagnosis Date  . Hypertension   . Falls     pt states fell twice this week   . History of blood transfusion     12/2014  . Anemia     Past Surgical History  Procedure Laterality Date  . Dilation and curettage of uterus    . Colonscopy     . Upper gi endoscopy    . Laparoscopic partial colectomy N/A 02/15/2015    Procedure: LAPAROSCOPIC RIGHT COLECTOMY AND SIGMOIDECTOMY PROCTOSCOPY;  Surgeon: Leighton Ruff, MD;  Location: [facility];  Service: General;  Laterality: N/A;  . Upper gi endoscopy  02/15/2015    Procedure: UPPER GI ENDOSCOPY;  Surgeon: Leighton Ruff, MD;  Location: [facility];  Service: General;;    There were no vitals filed for this visit.  Visit Diagnosis:  Weakness - Plan: PT plan of care cert/re-cert      Subjective Assessment - 03/23/15 1006    Subjective Patient is attending GI clinic.  Therapist consulted team on course of treatmet which includes Xeloda for 6 months.  Side effects include fatique and diarrhea.    Patient is accompained by: Family member  husband   Pertinent History surgery on 02/15/15.    Patient Stated Goals education   Currently in Pain? No/denies            [facility] PT Assessment - 03/23/15 0001    Assessment   Medical Diagnosis Colon Cancer   Referring Provider Dr Burr Medico   Onset Date/Surgical Date 12/21/14  colonoscopy   Prior  Therapy None   Precautions   Precautions Other (comment)   Precaution Comments cancer precautions   Balance Screen   Has the patient fallen in the past 6 months Yes   How many times? 2  too weak due to not eating prior to surgery   Has the patient had a decrease in activity level because of a fear of falling?  No   Is the patient reluctant to leave their home because of a fear of falling?  No   Prior Function   Level of Independence Independent   Cognition   Overall Cognitive Status Within Functional Limits for tasks assessed   Observation/Other Assessments   Focus on Therapeutic Outcomes (FOTO)  Therapist discretion 10% limited due to still recovering from surgery   ROM / Strength   AROM / PROM / Strength AROM;Strength   AROM   Lumbar Extension decreased by 50%   Lumbar - Right Side Bend decreased by 25%   Lumbar - Left Side Bend decreased by 25%   Strength   Overall Strength Comments abdominal strength is 3/5   Flexibility   Soft Tissue Assessment /Muscle Length no   Palpation   Palpation comment decrerased mobility of healed scar on abdomen with some thichknee   Balance   Balance Assessed --  stand  on one leg 1 second                           PT Education - Mar 25, 2015 1018    Education provided Yes   Education Details tips to conserve energy, balance exercise, scar massage, walking program   Person(s) Educated Patient;Spouse   Methods Explanation;Demonstration;Handout;Verbal cues   Comprehension Returned demonstration;Verbalized understanding             PT Long Term Goals - Mar 25, 2015 1025    PT LONG TERM GOAL #1   Title education on concerving energy, walking program, balance exercises   Time 1   Period Days   Status Achieved   PT LONG TERM GOAL #2   Title understand correct scar massage technique to keep mobility   Time 1   Period Days   Status Achieved               Plan - 25-Mar-2015 1018    Clinical Impression Statement  Patient is a 75 year old female with diagnosis of colon cancer.  Patient had surgery on 02/15/2015.  Patient will start Xeloda for 6 months and side effects is fatique and diarreha.  Patient has fallen in the past 6 months due to fatique from not eating prior to surgery.  Surgical site is healed with decreased mobility and thickness. Abdominal strength is 3/5.  Lumbar extension decreased by 50% and bilateral sidebending decreased by 25%. Patient reports no pain.  Patient limted by 10% by therapist discretion due to decreased balance and mobility.  Patient is a moderate complexity due to  meeting with team for treatment, assessment of of patient, preparing patient for side effects of chemotherapy and how it can affect her life and balance issues.  Patient  will benefit from therapy to educate  on energy conservation, scar massage and exercise.    Pt will benefit from skilled therapeutic intervention in order to improve on the following deficits Decreased endurance;Decreased strength   Rehab Potential Excellent   Clinical Impairments Affecting Rehab Potential None   PT Frequency 1x / week   PT Duration --  1 session in GI clinic   PT Treatment/Interventions Scar mobilization;Patient/family education;Neuromuscular re-education;Therapeutic exercise;ADLs/Self Care Home Management   PT Next Visit Plan HEP due to 1 time visit in GI clinic   PT Home Exercise Plan Current HEP   Recommended Other Services [facility]   Consulted and Agree with Plan of Care Patient          G-Codes - 25-Mar-2015 1026    Functional Assessment Tool Used therapist discretion 10% limitaiton   Functional Limitation Other PT primary   Other PT Primary Current Status [id number]) At least 1 percent but less than 20 percent impaired, limited or restricted   Other PT Primary Goal Status [id number]) At least 1 percent but less than 20 percent impaired, limited or restricted   Other PT Primary Discharge Status [id number]) At least 1 percent but less than  20 percent impaired, limited or restricted       Problem List Patient Active Problem List   Diagnosis Date Noted  . 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 Iron deficiency anemia 03/25/15  . Hypertension 03/25/15  . Cancer of right colon [facility]) 02/15/2015    Earlie Counts, PT 2015/03/25 10:28 AM    [facility] [address], [place], [place], [address] Phone: [phone]  Fax:  [phone]  Name: Olivia Jackson MRN: [id number] Date of Birth: December 31, 1940

## 2015-03-23 NOTE — Progress Notes (Signed)
GI Clinic [facility] Psychosocial Distress Screening Clinical Social Work  Clinical Social Work met with pt and her husband at [facility] to introduce self, explain role of CSW and review the distress screening protocol.  The patient scored a 0 on the Psychosocial Distress Thermometer which indicates no distress. Clinical Social Worker further assessed for distress and other psychosocial needs. Pt reports she has a strong support system consisting of husband, family and her church. She reports she is usually very strong and handles things well. She reports that she just "keeps going" when things are hard. CSW explained to pt how distress can go up and down at times, and that's ok too. CSW reviewed options for additional support, resources, etc. Pt stated understanding and agrees to reach out as needs arise.   ONCBCN DISTRESS SCREENING 03/23/2015  Screening Type Initial Screening  Distress experienced in past week (1-10) 0  Information Concerns Type Lack of info about diagnosis;Lack of info about treatment  Referral to support programs Yes    Clinical Social Worker follow up needed: No.  If yes, follow up plan: Loren Racer, [place]  [facility] Phone: [phone] Fax: [phone]

## 2015-03-23 NOTE — Telephone Encounter (Signed)
03/23/15: New Rx for Xeloda faxed to [facility]

## 2015-03-23 NOTE — Progress Notes (Signed)
Late entry: Per Dr. Burr Medico, instructed patient to hold her Norvasc due to hypotension and contact her PCP

## 2015-03-23 NOTE — Patient Instructions (Signed)
Ways to get started on an exercise program 1.  Start for 10 minutes per day with a walking program. 2. Work towards 30 minutes of exercise per day 3. When you do an aerobic exercise program start on a low level 4. Water aerobics is a good place due to decreased strain on your joints 5. Begin your exercise program gradually and progress slowly over time 6. When exercising use correct form. a. Keep neutral spine b. Engage abdominals c. Keep chest up  d. Chin down e. Do not lock your knees  Earlie Counts, PT [facility], [place], [place] [address] [phone]   Tips for Energy Conservation for Activities of Daily Living . Plan ahead to avoid rushing. . Sit down to bathe and dry off. Wear a terry robe instead of drying off. . Use a shower/bath organizer to decrease leaning and reaching. . Use extension handles on sponges and brushes. Susa Simmonds grab rails in the bathroom or use an elevated toilet seat. Hoyle Barr out clothes and toiletries before dressing. . Minimize leaning over to put on clothes and shoes. Bring your foot to your knee to apply socks and shoes. . Wear comfortable shoes and low-heeled, slip on shoes. Wear button front shirts rather than pullovers. Housekeeping . Schedule household tasks throughout the week. . Do housework sitting down when possible. . Delegate heavy housework, shopping, laundry and child care when possible. . Drag or slide objects rather than lifting. . Sit when ironing and take rest periods. . Stop working before becoming overly tired. Shopping . Organize list by aisle. . Use a grocery cart for support. Marland Kitchen Shop at less busy times. . Ask for help with getting to the car. Meal Preparation . Use convenience and easy-to-prepare foods. . Use small appliances that take less effort to use. Marland Kitchen Prepare meals sitting down. . Soak dishes instead of scrubbing and let dishes air dry. . Prepare double portions  and freeze half. Child Care . Plan activities that can be done sitting down, such as drawing pictures, playing games, reading, and computer games. . Encourage children to climb up onto your lap or into the highchair instead of being lifted. . Make a game of the household chores so that children will want to help. . Delegate child care when possible.  Earlie Counts, PT,  [facility]; [address], [address] [place], [place] [address]  Scar Massage  Scar massage is done to improve the mobility of scar, decrease scar tissue from building up, reduce adhesions, and prevent Keloids from forming. Start scar massage after scabs have fallen off by themselves and no open areas. The first few weeks after surgery, it is normal for a scar to appear pink or red and slightly raised. Scars can itch or have areas of numbness. Some scars may be sensitive.   Direct Scar massage: after scar is healed, no opening, no scab 1.  Place pads of two fingers together directly on the scar starting at one end of the scar. Move the fingers up and down across the scar holding 5 seconds one direction.  Then go opposite direction hold 5 seconds.  2. Move over to the next section of the scar and repeat.  Work your way along the entire length of the scar.   3. Next make diagonal movements along the scar holding 5 seconds at one direction. 4. Next movement is side to side. 5. Do not rub fingers over  the scar.  Instead keep firm pressure and move scar over the tissue it is on top   Scar Lift and Roll 12 weeks after surgery. 1. Pinch a small amount of the scar between your first two fingers and thumb.  2. Roll the scar between your fingers for 5 to 15 seconds. 3. Move along the scar and repeat until you have massaged the entire length of scar.   Stop the massage and call your doctor if you notice: 1. Increased redness 2. Bleeding from scar 3. Seepage coming from the scar 4. Scar is warmer  and has increased pain     Stand on one foot at counter and hold for 30 seconds while looking at one spot and tighten abdominals.  2 times each leg, 1 time per day  Stand with one foot infront of other at a counter . Hold 30 seconds.  Look at one spot and tighten abdominals. 2 times each way 1 time per day.  [place] [address], [place], [place] [address] Phone # [phone] Fax [phone]

## 2015-03-23 NOTE — Progress Notes (Addendum)
[place]  [phone] Fax:([phone]  Clinic New Consult Note   Patient Care Team: L.Donnie Coffin, MD as PCP - General (Family Medicine) 03/23/2015   REFERRAL PHYSICIAN: Dr. 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)   Staging form: Colon and Rectum, AJCC 7th Edition     Pathologic stage from 02/15/2015: Stage IIIB (T3, N1c, cM0) - Signed by Truitt Merle, MD on 03/23/2015        Cancer of right colon ([place])   12/21/2014 Procedure EGD was normal. Colonoscopy showed a ulcerated completely obstructing large mass in the rectosigmoid colon, 15 cm from anus. Diverticulosis in the rectosigmoid colon. Biopsy was taken from the mass.   12/29/2014 Imaging CT chest, abdomen and pelvis showed a circumferential mass lesion in the proximal right colon above the ileocecal valve consistent with carcinoma of the colon. No adenopathy. No distant metastasis.   02/15/2015 Initial Diagnosis Cancer of right colon ([place])   02/15/2015 Surgery Laparoscopic right colectomy and sigmoidectomy   02/15/2015 Pathology Results Right colon segmental resection showed a invasive poorly differentiated adenocarcinoma, spanning 8 cm, T3, margins were negative, 16 lymph nodes. No lymphovascular invasion, no perineural invasion. MMR showed loss of expression of ML H1 and PMS2, MSI-H   02/15/2015 Pathology Results Sigmoid colon segmental resection showed invasive well differentiated adenocarcinoma, 5.5 cm, T3, 10 lymph nodes were negative, (+) tumor deposit. 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 female is here because of her recently diagnosed colon cancer. She is accompanied by her husband to our multidisciplinary GI clinic today.  She presented with fatigue and dizzines in 10/2014, she also had decreased appetite, mild intermittent blood in stool, moderate constipation, and weight loss about 30lbs. She was seen by her primary care physician, was found to be anemic, and required blood transfusion in October 2016. She was referred to gastroenterologist Dr. Oletta Lamas, and underwent EGD and colonoscopy on 12/21/2014, which showed a ulcerated mass in the sigmoid-rectum, 15 cm from anal verge, in the scope was not able to advance. Multiple biopsy from the sigmoid colon mass showed invasive adenocarcinoma. CT of the chest abdomen and pelvis was obtained, which showed a circumferential mass lesion in the proximal right colon above the ileocecal valve. She was referred to colorectal surgeon Dr. Marcello Moores, and underwent left scopic right colectomy and sigmoidectomy on 02/15/2015. She was discharged home 5 days after her surgery.  She has revoered very well from her surgery. She has greast appetite, eats well, normal BM, takes miralax as needed, no pain gained 6-7 lbs weight back since the surgery. She has good energy level, and remains to be physically active at home.  She has no children, no family history of colon cancer or other malignancy except her father had brain tumor.  MEDICAL HISTORY:  Past Medical History  Diagnosis Date  . Hypertension   . Falls     pt states fell twice this week   . History of blood  transfusion     12/2014  . Anemia     SURGICAL HISTORY: Past Surgical History  Procedure Laterality Date  . Dilation and curettage of uterus    . Colonscopy     . Upper gi endoscopy    . Laparoscopic partial colectomy N/A 02/15/2015    Procedure: LAPAROSCOPIC  RIGHT COLECTOMY AND SIGMOIDECTOMY PROCTOSCOPY;  Surgeon: Leighton Ruff, MD;  Location: [facility];  Service: General;  Laterality: N/A;  . Upper gi endoscopy  02/15/2015    Procedure: UPPER GI ENDOSCOPY;  Surgeon: Leighton Ruff, MD;  Location: [facility];  Service: General;;    SOCIAL HISTORY: Social History   Social History  . Marital Status: Married    Spouse Name: N/A  . Number of Children: N/A  . Years of Education: N/A   Occupational History  . Not on file.   Social History Main Topics  . Smoking status: Former Smoker -- 0.50 packs/day for 20 years    Types: Cigarettes    Quit date: 03/04/1995  . Smokeless tobacco: Never Used  . Alcohol Use: No  . Drug Use: No  . Sexual Activity: Not on file   Other Topics Concern  . Not on file   Social History Narrative    FAMILY HISTORY: Family History  Problem Relation Age of Onset  . Cancer Father 30    brain tumor     ALLERGIES:  has No Known Allergies.  MEDICATIONS:  Current Outpatient Prescriptions  Medication Sig Dispense Refill  . acetaminophen (TYLENOL) 325 MG tablet Take 2 tablets (650 mg total) by mouth every 6 (six) hours as needed.    Marland Kitchen amLODipine (NORVASC) 10 MG tablet Take 5 mg by mouth daily.  3  . ferrous sulfate 325 (65 FE) MG tablet Take 325 mg by mouth 2 (two) times daily.  3  . polyethylene glycol (MIRALAX / GLYCOLAX) packet Take 17 g by mouth daily as needed for mild constipation, moderate constipation or severe constipation.    . triamterene-hydrochlorothiazide (MAXZIDE-25) 37.5-25 MG tablet Take 0.5 tablets by mouth daily.  3  . capecitabine (XELODA) 500 MG tablet Take 3 tablets (1,500 mg total) by mouth 2 (two) times daily after a meal. 84 tablet 3   No current facility-administered medications for this visit.    REVIEW OF SYSTEMS:   Constitutional: Denies fevers, chills or abnormal night sweats Eyes: Denies blurriness of vision, double vision or watery eyes Ears, nose, mouth, throat, and face: Denies  mucositis or sore throat Respiratory: Denies cough, dyspnea or wheezes Cardiovascular: Denies palpitation, chest discomfort or lower extremity swelling Gastrointestinal:  Denies nausea, heartburn or change in bowel habits Skin: Denies abnormal skin rashes Lymphatics: Denies new lymphadenopathy or easy bruising Neurological:Denies numbness, tingling or new weaknesses Behavioral/Psych: Mood is stable, no new changes  All other systems were reviewed with the patient and are negative.  PHYSICAL EXAMINATION: ECOG PERFORMANCE STATUS: 1 - Symptomatic but completely ambulatory  Filed Vitals:   03/23/15 0835  BP: 89/50  Pulse: 64  Temp: 97.7 F (36.5 C)  Resp: 18   Filed Weights   03/23/15 0835  Weight: 111 lb 14.4 oz (50.758 kg)    GENERAL:alert, no distress and comfortable SKIN: skin color, texture, turgor are normal, no rashes or significant lesions EYES: normal, conjunctiva are pink and non-injected, sclera clear OROPHARYNX:no exudate, no erythema and lips, buccal mucosa, and tongue normal  NECK: supple, thyroid normal size, non-tender, without nodularity LYMPH:  no palpable lymphadenopathy in the cervical, axillary or inguinal  LUNGS: clear to auscultation and percussion with normal breathing effort HEART: regular rate & rhythm and no murmurs and no lower extremity edema ABDOMEN:abdomen soft, non-tender and normal bowel sounds. Midline surgical scar and laparoscopic surgical scars are all well healed, no skin erythema or discharge. Musculoskeletal:no cyanosis of digits and no clubbing  PSYCH: alert & oriented x 3 with fluent speech NEURO: no focal motor/sensory deficits  LABORATORY DATA:  I have reviewed the data as listed Lab Results  Component Value Date   WBC 9.1 02/18/2015   HGB 10.2* 02/18/2015   HCT 31.6* 02/18/2015   MCV 78.6 02/18/2015   PLT 479* 02/18/2015    Recent Labs  02/16/15 0433 02/17/15 0513 02/18/15 0543  NA 136 136 137  K 3.2* 3.6 3.9  CL 101 103  103  CO2 _0 GLUCOSE 99 93 95  BUN 9 7 <5*  CREATININE 0.82 0.65 0.64  CALCIUM 8.5* 8.2* 8.3*  GFRNONAA >60 >60 >60  GFRAA >60 >60 >60   PATHOLOGY REPORT  Diagnosis 02/15/2015 1. Colon, segmental resection for tumor, right - INVASIVE POORLY DIFFERENTIATED ADENOCARCINOMA, SPANNING 8 CM IN GREATEST DIMENSION. - TUMOR INVADES THROUGH MUSCULARIS PROPRIA TO INVOLVE SUBSEROSAL SOFT TISSUES. - MARGIN IS NEGATIVE. - SIXTEEN BENIGN LYMPH NODES WITH NO TUMOR SEEN (0/16). - SEE ONCOLOGY TEMPLATE. 2. Colon, segmental resection for tumor, sigmoid - INVASIVE WELL DIFFERENTIATED ADENOCARCINOMA WITH ABUNDANT EXTRACELLULAR MUCIN, SPANNING 5.5 CM IN GREATEST DIMENSION. - TUMOR INVADES THROUGH MUSCULARIS PROPRIA TO INVOLVE SUBSEROSAL SOFT TISSUES. - MARGINS ARE NEGATIVE. - SATELLITE TUMOR DEPOSIT (DISCONTINUOUS EXTRAMURAL EXTENSION) IS PRESENT. - TEN BENIGN LYMPH NODES WITH NO TUMOR SEEN (0/10). - SEE ONCOLOGY TEMPLATE BELOW. 3. Colon, resection margin (donut), distal anastomotic ring - BENIGN COLORECTAL MUCOSA. - NO TUMOR SEEN.  Microscopic Comment 1. COLON AND RECTUM: Specimen: Right colon. Procedure: Right hemicolectomy. Tumor site: Ascending colon. Specimen integrity: Intact. Macroscopic tumor perforation: Not identified. Invasive tumor: Maximum size: 8 cm. Histologic type(s): Adenocarcinoma. Histologic grade and differentiation: G3: poorly differentiated/high grade. Type of polyp in which invasive carcinoma arose: A definitive precursor polyp is not identified. Microscopic extension of invasive tumor: Tumor invades through muscularis propria to involve subserosal soft tissues. Lymph-Vascular invasion: Definitive lymph/vascular invasion is not identified. Peri-neural invasion: Not identified. Tumor deposit(s) (discontinuous extramural extension): No tumor deposits showing a high grade poorly differentiated adenocarcinoma are identified. Resection margins: Proximal margin:  Negative. Distal margin: Negative. Circumferential (radial) (posterior ascending, posterior descending; lateral and posterior mid-rectum; and entire lower 1/3 rectum): Negative. Distance closest margin (if all above margins negative): 4.5 cm (radial margin). Treatment effect (neo-adjuvant therapy): Not applicable. Additional polyp(s): Three additional tubular adenomas are present in the right colon specimen. Non-neoplastic findings: Benign unremarkable appendix. Lymph nodes: number examined: 16 (from the right colon specimen); number positive: 0. Pathologic Staging: pT3, pN0. Ancillary studies: The tumor will be sent for MMR by [place] and MSI by PCR. 2. COLON AND RECTUM: Specimen: Right partial colon (sigmoid colon). Procedure: Right partial colectomy (sigmoid resection). Tumor site: Sigmoid colon (distal aspect of the specimen along the posterior aspect). Specimen integrity: Intact. Macroscopic tumor perforation: Not identified. Invasive tumor: Maximum size: 5.5 cm. Histologic type(s): Adenocarcinoma with abundant extracellular mucin. Histologic grade and differentiation: G1: well differentiated/low grade. Type of polyp in which invasive carcinoma arose: Tumor arose from a tubular adenoma with high grade glandular dysplasia. Microscopic extension of invasive tumor: Tumor invades through muscularis propria to involve subserosal soft tissue. Lymph-Vascular invasion: Definitive lymph/vascular invasion is not identified, however there  is a tumor deposit present, see below. Peri-neural invasion: Not identified. Tumor deposit(s) (discontinuous extramural extension): Yes, tumor deposit is present. Resection margins: Proximal margin: Negative. Distal margin: Negative. Circumferential (radial) (posterior ascending, posterior descending; lateral and posterior mid-rectum; and entire lower 1/3 rectum): 3.1 cm. Distance closest margin (if all above margins negative): 2.4 cm (distal  margin). Treatment effect (neo-adjuvant therapy): Not applicable. Additional polyp(s): Benign polypoid colorectal mucosa with hyperplastic-type change (possibly representing early hyperplastic polyp formation) is present. Non-neoplastic findings: No additional non-neoplastic findings Lymph nodes: number examined: 10 (from the sigmoid colon specimen); number positive: 0, see comment. Pathologic Staging: pT3, pN1c. Ancillary studies: The tumor will be sent for MMR by IHC and MSI by PCR per colorectal cancer protocol. Comment: Initially, only 7 lymph nodes are identified in the sigmoid colon specimen. The specimen is placed in fat clearing solution with an additional three lymph nodes identified on second gross assessment. (RH:kh 02-19-15)  Mismatch Repair (MMR) Protein Immunohistochemistry (IHC) IHC Expression Result: MLH1: LOSS OF NUCLEAR EXPRESSION (LESS THAN 5% TUMOR EXPRESSION) MSH2: Preserved nuclear expression (greater 50% tumor expression) MSH6: Preserved nuclear expression (greater 50% tumor expression) PMS2: LOSS OF NUCLEAR EXPRESSION (LESS THAN 5% TUMOR EXPRESSION) * Internal control demonstrates intact nuclear expression Interpretation: ABNORMAL There is loss of the major and minor MMR proteins MLH1 and PMS2. The loss of expression may be secondary to promoter hyper-methylation, gene mutation or other genetic event. BRAF mutation testing and/or MLH1 methylation testing is indicated. The presence of a BRAF mutation and/or MLH1 hyper-methylation is indicative of a sporadic-type tumor. The absence of either BRAF mutation and/or presence of normal-methylation indicate the possible presence of a hereditary germline mutation (e.g. Lynch syndrome) and referral to genetic counseling is warranted. It is recommended that the loss of protein expression be correlated with molecular based MSI testing.  2. Mismatch Repair (MMR) Protein Immunohistochemistry (IHC) IHC Expression Result: MLH1:  Preserved nuclear expression (greater 50% tumor expression) MSH2: Preserved nuclear expression (greater 50% tumor expression) MSH6: Preserved nuclear expression (greater 50% tumor expression) PMS2: Preserved nuclear expression (greater 50% tumor expression) * Internal control demonstrates intact nuclear expression Interpretation: NORMAL       RADIOGRAPHIC STUDIES: I have personally reviewed the radiological images as listed and agreed with the findings in the report.  CT chest wo contrast 12/29/2014  IMPRESSION: No definite evidence of metastases. There are 2 nonenlarged but questionable right axillary lymph nodes.  CT abdomen and pelvis w contrast 12/29/2014  IMPRESSION: Circumferential mass lesion in the proximal right colon above the ileocecal valve consistent with carcinoma of the colon. There is stranding in the pericolonic fat which could be due to tumor extension. No adenopathy.  Constipation without bowel obstruction.  These results will be called to the ordering clinician or representative by the Radiologist Assistant, and communication documented in the PACS or zVision Dashboard.   ASSESSMENT & PLAN: 75 year old African-American female with past medical history of hypertension, presented with fatigue, anemia and dizziness.  1. Right colon cancer, pT3N0M0 stage IIA, poorly differentiated,MSI-high, and sigmoid-rectal colon cancer, pT3N1cM0 stage IIIB, well differentiated,MSI-stable  -I reviewed her surgical pathology findings with patient and her husband in great details. She has 2 multifocal colon adenocarcinoma was distinct features. Her right colon cancer is stage IIA, poorly differentiated, MSI high, has P ref mutation and MLH-1 hypervascular mass lesion, which supports a sporadic colon cancer, unlikely Lynch syndrome. 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 -Her staging CT scan was  negative for distant metastasis. Her pre-op CEA was elevated at 7.5. -We discussed the risk of cancer recurrence after her complete surgical resection. The left side colon cancer is more locally advanced, which predicts high risk of cancer recurrence, and standard care for stage III colon cancer is adjuvant chemotherapy. -I discussed the adjuvant chemotherapy options including FOLFOX, or single agent 5-FU or capecitabine. The benefit of oxaliplatin in elderly patients, especially at about age 10, is probably limited. Although she is 33, she has good baseline health's, performance status, would be a candidate for single agent 5-FU and capecitabine. I recommend capecitabine,  109m/m2, twice daily, 2 weeks on, 1 week off, for a total of 6 months. --Chemotherapy consent: Side effects including but does not not limited to, fatigue, nausea, vomiting, diarrhea, hair loss, neuropathy, fluid retention, renal and kidney dysfunction, neutropenic fever, needed for blood transfusion, bleeding, Coronary artery spasm,were discussed with patient in great detail. She agrees to proceed. -I'll send a prescription of capecitabine 15054mto specialty pharmacy, we plan to start in 2 weeks. -We also discussed the role of adjuvant radiation, since her sigmoid cancer has invaded into rectum. Her case was discussed in our tumor tumor board a few weeks ago, adjuvant radiation was not recommended.  -We also discussed colon cancer surveillance, including lab work and physical exam every 3-4 months for the first 2 years, then every 6-12 months for additional 3 years. She will need a repeat colonoscopy in one year, and I will plan to repeat CT scan every 6 months for up to 5 years.  2. Hypotension -she is on 2 blood pressure medication, and we repeated her blood pressure still 90/50. She is asymptomatic -I told her to hold amlodipine for now, she is going to see her primary care physician Dr. MiAlroy Dustext Friday. I encouraged her to  check her blood pressure at home.  3. Anemia, probably iron deficient from her colon cancer -She required blood transfusion a total of 3 units before her colon surgery -I'll repeat her CBC and iron studies today, to see if she needs IV Feraheme -She is on oralferrous sulfate 2 times a day, tolerating well. We'll continue  4. Malnutrition and weight loss -Secondary to her colon cancer  -She'll meet our dietitian BaShonicaoday -she is eating well, and gaining weight back  Plan -lab today -will send prescription of capecitabien to specialty pharmacy, she will call [place] she receives capecitabine  -Plan to start her on capecitabine on 04/04/2015 -I'll see her back in 3 weeks   All questions were answered. The patient knows to call the clinic with any problems, questions or concerns. I spent 55 minutes counseling the patient face to face. The total time spent in the appointment was 60 minutes and more than 50% was on counseling.     FeTruitt MerleMD 03/23/2015 10:10 AM

## 2015-03-24 LAB — CEA: CEA1: 1.7 ng/mL (ref 0.0–4.7)

## 2015-03-29 ENCOUNTER — Telehealth: Payer: Self-pay | Admitting: *Deleted

## 2015-03-29 NOTE — Telephone Encounter (Signed)
  Oncology Nurse Navigator Documentation  Navigator Location: [facility] (03/29/15 [id number]) Navigator Encounter Type: Telephone (03/29/15 0955) Telephone: Outgoing Call (03/29/15 0955)  Per [place]: copay for Xeloda is $460.08. Called patient and left VM to call back and ask for GI Navigator to discuss her chemo pills and co pay. Per Montel Clock, RPh: he is aware and is working on finding copay assist along with [employer].

## 2015-03-30 ENCOUNTER — Ambulatory Visit: Payer: Medicare Other

## 2015-03-30 ENCOUNTER — Other Ambulatory Visit: Payer: Self-pay | Admitting: Hematology

## 2015-03-30 ENCOUNTER — Telehealth: Payer: Self-pay | Admitting: *Deleted

## 2015-03-30 ENCOUNTER — Encounter: Payer: Self-pay | Admitting: Pharmacist

## 2015-03-30 MED FILL — CAPECITABINE 500 MG TABLET: 500 | 14 days supply | Qty: 84 | Fill #0

## 2015-03-30 NOTE — Progress Notes (Signed)
Oral Chemotherapy Pharmacist Encounter   I spoke with patient for overview of new oral chemotherapy medication: Xeloda. Pt is doing well. The prescriptions have been sent to the [place] for benefit analysis and approval. Copay is ~$460. Patient qualifies for open grant funds with PANF for $9000. This will cover her 6 month course of Xeloda. Pt can pick up Rx from [place] patient on administration, dosing, side effects, safe handling, and monitoring. Side effects include but not limited to: Fatigue, diarrhea, upset stomach, mouth sores, and hand/foot syndrome.  Ms. Olivia Jackson voiced understanding and appreciation.   All questions answered.  Will follow up in 1-2 weeks for adherence and toxicity management.   Thank you,  Montel Clock, PharmD, [facility]

## 2015-03-30 NOTE — Telephone Encounter (Signed)
TC from patient to inform us that she has picked up her Xeloda and she is asking when she should start taking it. Spoke with Dr. Burr Medico. Dr. Burr Medico states patient may start taking on 04/04/15. Pt has f/u appt with MD on 04/13/15.  Informed patient of the above. Pt voiced understanding.

## 2015-04-09 ENCOUNTER — Telehealth: Payer: Self-pay | Admitting: *Deleted

## 2015-04-09 NOTE — Telephone Encounter (Signed)
  Oncology Nurse Navigator Documentation  Navigator Location: [facility] (04/09/15 1036) Navigator Encounter Type: Telephone (04/09/15 1036) Telephone: Incoming Call;Outgoing Call;Appt Confirmation/Clarification (04/09/15 1036)     Olivia Jackson is requesting all her appointments to be at 11:00 or after. Needs to reschedule the 04/13/15 visit.                 Interventions: Other (04/09/15 1036): Called her back and made her aware that I will forward her request to the scheduler. POF sent.

## 2015-04-11 ENCOUNTER — Telehealth: Payer: Self-pay | Admitting: Pharmacist

## 2015-04-11 ENCOUNTER — Telehealth: Payer: Self-pay | Admitting: Hematology

## 2015-04-11 NOTE — Telephone Encounter (Signed)
04/11/15: Attempted to reach patient for follow up on oral medication: Xeloda. No answer. Left VM for patient to call back with any questions or issues.   Thank you,  Chris Kenyatta Keidel, PharmD, BCOP [facility] [phone] 

## 2015-04-11 NOTE — Telephone Encounter (Signed)
Called patient and she is aware of her new appts on 2/10

## 2015-04-13 ENCOUNTER — Encounter: Payer: Self-pay | Admitting: Hematology

## 2015-04-13 ENCOUNTER — Ambulatory Visit (HOSPITAL_BASED_OUTPATIENT_CLINIC_OR_DEPARTMENT_OTHER): Payer: Medicare Other | Admitting: Hematology

## 2015-04-13 ENCOUNTER — Other Ambulatory Visit (HOSPITAL_BASED_OUTPATIENT_CLINIC_OR_DEPARTMENT_OTHER): Payer: Medicare Other

## 2015-04-13 ENCOUNTER — Other Ambulatory Visit: Payer: Medicare Other

## 2015-04-13 ENCOUNTER — Ambulatory Visit: Payer: Medicare Other | Admitting: Hematology

## 2015-04-13 ENCOUNTER — Telehealth: Payer: Self-pay | Admitting: Hematology

## 2015-04-13 VITALS — BP 160/50 | HR 90 | Temp 97.3°F | Resp 18 | Ht 62.0 in | Wt 116.5 lb

## 2015-04-13 DIAGNOSIS — D509 Iron deficiency anemia, unspecified: Secondary | ICD-10-CM

## 2015-04-13 DIAGNOSIS — C772 Secondary and unspecified malignant neoplasm of intra-abdominal lymph nodes: Secondary | ICD-10-CM | POA: Diagnosis not present

## 2015-04-13 DIAGNOSIS — E46 Unspecified protein-calorie malnutrition: Secondary | ICD-10-CM

## 2015-04-13 DIAGNOSIS — D473 Essential (hemorrhagic) thrombocythemia: Secondary | ICD-10-CM

## 2015-04-13 DIAGNOSIS — C187 Malignant neoplasm of sigmoid colon: Secondary | ICD-10-CM

## 2015-04-13 DIAGNOSIS — I10 Essential (primary) hypertension: Secondary | ICD-10-CM

## 2015-04-13 DIAGNOSIS — C182 Malignant neoplasm of ascending colon: Secondary | ICD-10-CM

## 2015-04-13 LAB — CBC WITH DIFFERENTIAL/PLATELET
BASO%: 0.1 % (ref 0.0–2.0)
BASOS ABS: 0 10*3/uL (ref 0.0–0.1)
EOS%: 2.1 % (ref 0.0–7.0)
Eosinophils Absolute: 0.2 10*3/uL (ref 0.0–0.5)
HCT: 38.1 % (ref 34.8–46.6)
HEMOGLOBIN: 12 g/dL (ref 11.6–15.9)
LYMPH%: 28.1 % (ref 14.0–49.7)
MCH: 24.6 pg — AB (ref 25.1–34.0)
MCHC: 31.5 g/dL (ref 31.5–36.0)
MCV: 78.1 fL — ABNORMAL LOW (ref 79.5–101.0)
MONO#: 0.3 10*3/uL (ref 0.1–0.9)
MONO%: 4.4 % (ref 0.0–14.0)
NEUT#: 4.9 10*3/uL (ref 1.5–6.5)
NEUT%: 65.3 % (ref 38.4–76.8)
Platelets: 409 10*3/uL — ABNORMAL HIGH (ref 145–400)
RBC: 4.88 10*6/uL (ref 3.70–5.45)
RDW: 19 % — AB (ref 11.2–14.5)
WBC: 7.5 10*3/uL (ref 3.9–10.3)
lymph#: 2.1 10*3/uL (ref 0.9–3.3)

## 2015-04-13 LAB — COMPREHENSIVE METABOLIC PANEL
ALT: 15 U/L (ref 0–55)
AST: 23 U/L (ref 5–34)
Albumin: 3.6 g/dL (ref 3.5–5.0)
Alkaline Phosphatase: 70 U/L (ref 40–150)
Anion Gap: 12 mEq/L — ABNORMAL HIGH (ref 3–11)
BUN: 19.6 mg/dL (ref 7.0–26.0)
CHLORIDE: 102 meq/L (ref 98–109)
CO2: 25 mEq/L (ref 22–29)
Calcium: 9.5 mg/dL (ref 8.4–10.4)
Creatinine: 0.8 mg/dL (ref 0.6–1.1)
EGFR: 87 mL/min/{1.73_m2} — ABNORMAL LOW (ref >90)
GLUCOSE: 83 mg/dL (ref 70–140)
POTASSIUM: 3.7 meq/L (ref 3.5–5.1)
SODIUM: 138 meq/L (ref 136–145)
Total Bilirubin: <0.3 mg/dL (ref 0.20–1.20)
Total Protein: 7.9 g/dL (ref 6.4–8.3)

## 2015-04-13 NOTE — Progress Notes (Signed)
Introduced myself as her FA.  Informed pt of the [place] and gave her an expense sheet.  She will provide me with her and her husband's SS letter or bank statement to see if she qualifies for the grant.  Pt is approved w/ PAN for Xeloda from 03/30/15 to 03/28/16 or when benefit cap has been met.  Amount of grant is $9,000.  She has my card for any billing questions or concerns.

## 2015-04-13 NOTE — Telephone Encounter (Signed)
Appointments made and avs printed °

## 2015-04-13 NOTE — Progress Notes (Signed)
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 Colonoscopy showed a ulcerated completely obstructing large mass in the rectosigmoid colon, 15 cm from anus. Diverticulosis in the rectosigmoid colon. Biopsy was taken from the mass.   12/29/2014 Imaging CT chest, abdomen and pelvis showed a circumferential mass lesion in the proximal right colon above the ileocecal valve consistent with carcinoma of the colon. No adenopathy. No distant metastasis.   02/15/2015 Initial Diagnosis Cancer of right colon ([place])   02/15/2015 Surgery Laparoscopic right colectomy and sigmoidectomy   02/15/2015 Pathology Results Right colon segmental resection showed a invasive poorly differentiated adenocarcinoma, spanning 8 cm, T3, margins were negative, 16 lymph nodes. No lymphovascular invasion, no perineural invasion. MMR showed loss of expression of ML H1 and PMS2, MSI-H   02/15/2015 Pathology Results Sigmoid colon segmental resection showed invasive well differentiated adenocarcinoma, 5.5 cm, T3, 10 lymph nodes were negative, (+) tumor deposit. 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 female is here because of her recently diagnosed colon cancer. She is accompanied by her husband to our multidisciplinary GI clinic today.  She presented with fatigue and dizzines in 10/2014, she also had decreased appetite, mild intermittent blood in stool, moderate constipation, and weight loss about 30lbs. She was seen by her primary care physician, was found to be anemic, and required blood transfusion in October 2016. She was referred to gastroenterologist Dr. Oletta Lamas, and underwent EGD and colonoscopy on 12/21/2014, which showed a ulcerated mass in the sigmoid-rectum, 15 cm from anal verge, in the scope was not able to advance. Multiple biopsy from the sigmoid colon mass showed invasive adenocarcinoma. CT of the chest abdomen and pelvis was obtained, which showed a circumferential mass lesion in the proximal right colon above the ileocecal valve. She was referred to colorectal surgeon Dr. Marcello Moores, and underwent left scopic right colectomy and sigmoidectomy on 02/15/2015. She was discharged home 5 days after her surgery.  She has revoered very well from her surgery. She has greast appetite, eats well, normal BM, takes miralax as needed, no pain gained 6-7 lbs weight back since the surgery. She has good energy level, and remains to be physically active at home.  She has no children, no family history of colon cancer or other malignancy except her father had brain tumor.  CURRENT THERAPY: Xeloda 1574m bid, 2  weeks on, 1 week off, started on 04/04/2015, plan for 8 cycles   INTERIM HISTORY:  Olivia Jackson returns for follow-up. She has started adjuvant Xeloda 10 days ago, she is very compliant, and tolerates very well. She denies any noticeable side effects,  such as nausea, appetite change, diarrhea, skin rash etc. she has very good appetite and energy level, functions very well at home and there remains to be active. She gained about 5 pounds in the past 3 weeks.  MEDICAL HISTORY:  Past Medical History  Diagnosis Date  . Hypertension   . Falls     pt states fell twice this week   . History of blood transfusion     12/2014  . Anemia     SURGICAL HISTORY: Past Surgical History  Procedure Laterality Date  . Dilation and curettage of uterus    . Colonscopy     . Upper gi endoscopy    . Laparoscopic partial colectomy N/A 02/15/2015    Procedure: LAPAROSCOPIC RIGHT COLECTOMY AND SIGMOIDECTOMY PROCTOSCOPY;  Surgeon: Leighton Ruff, MD;  Location: [facility];  Service: General;  Laterality: N/A;  . Upper gi endoscopy  02/15/2015    Procedure: UPPER GI ENDOSCOPY;  Surgeon: Leighton Ruff, MD;  Location: [facility];  Service: General;;    SOCIAL HISTORY: Social History   Social History  . Marital Status: Married    Spouse Name: N/A  . Number of Children: N/A  . Years of Education: N/A   Occupational History  . Not on file.   Social History Main Topics  . Smoking status: Former Smoker -- 0.50 packs/day for 20 years    Types: Cigarettes    Quit date: 03/04/1995  . Smokeless tobacco: Never Used  . Alcohol Use: No  . Drug Use: No  . Sexual Activity: Not on file   Other Topics Concern  . Not on file   Social History Narrative   Married, husband [name]   No children   Independent ADLs, drives    FAMILY HISTORY: Family History  Problem Relation Age of Onset  . Cancer Father 21    brain tumor     ALLERGIES:  has No Known Allergies.  MEDICATIONS:  Current Outpatient Prescriptions  Medication Sig Dispense Refill  . acetaminophen (TYLENOL) 325 MG tablet Take 2 tablets (650 mg total) by mouth every 6 (six) hours as needed.    . capecitabine (XELODA) 500 MG tablet Take 3 tablets (1,500 mg total) by mouth 2 (two) times daily after a  meal. 84 tablet 3  . ferrous sulfate 325 (65 FE) MG tablet Take 325 mg by mouth 2 (two) times daily.  3  . polyethylene glycol (MIRALAX / GLYCOLAX) packet Take 17 g by mouth daily as needed for mild constipation, moderate constipation or severe constipation.    . triamterene-hydrochlorothiazide (MAXZIDE-25) 37.5-25 MG tablet Take 0.5 tablets by mouth daily.  3   No current facility-administered medications for this visit.    REVIEW OF SYSTEMS:   Constitutional: Denies fevers, chills or abnormal night sweats Eyes: Denies blurriness of vision, double vision or watery eyes Ears, nose, mouth, throat, and face: Denies mucositis or sore throat Respiratory: Denies cough, dyspnea or wheezes Cardiovascular: Denies palpitation, chest discomfort or lower extremity swelling Gastrointestinal:  Denies nausea, heartburn or change in bowel habits Skin: Denies abnormal skin rashes Lymphatics: Denies new lymphadenopathy or easy bruising Neurological:Denies numbness, tingling or new weaknesses Behavioral/Psych: Mood is stable, no new changes  All other systems were reviewed with the patient  and are negative.  PHYSICAL EXAMINATION: ECOG PERFORMANCE STATUS: 1 - Symptomatic but completely ambulatory  Filed Vitals:   04/13/15 1157  BP: 160/50  Pulse: 90  Temp: 97.3 F (36.3 C)  Resp: 18   Filed Weights   04/13/15 1157  Weight: 116 lb 8 oz (52.844 kg)    GENERAL:alert, no distress and comfortable SKIN: skin color, texture, turgor are normal, no rashes or significant lesions EYES: normal, conjunctiva are pink and non-injected, sclera clear OROPHARYNX:no exudate, no erythema and lips, buccal mucosa, and tongue normal  NECK: supple, thyroid normal size, non-tender, without nodularity LYMPH:  no palpable lymphadenopathy in the cervical, axillary or inguinal LUNGS: clear to auscultation and percussion with normal breathing effort HEART: regular rate & rhythm and no murmurs and no lower extremity  edema ABDOMEN:abdomen soft, non-tender and normal bowel sounds. Midline surgical scar and laparoscopic surgical scars are all well healed, no skin erythema or discharge. Musculoskeletal:no cyanosis of digits and no clubbing  PSYCH: alert & oriented x 3 with fluent speech NEURO: no focal motor/sensory deficits  LABORATORY DATA:  I have reviewed the data as listed CBC Latest Ref Rng 04/13/2015 03/23/2015 02/18/2015  WBC 3.9 - 10.3 10e3/uL 7.5 9.5 9.1  Hemoglobin 11.6 - 15.9 g/dL 12.0 11.7 10.2(L)  Hematocrit 34.8 - 46.6 % 38.1 37.5 31.6(L)  Platelets 145 - 400 10e3/uL 409(H) 456(H) 479(H)     Recent Labs  02/16/15 0433 02/17/15 0513 02/18/15 0543 03/23/15 1029  NA 136 136 137 138  K 3.2* 3.6 3.9 3.7  CL 101 103 103  --   CO2 '24 28 28 26  ' GLUCOSE 99 93 95 73  BUN 9 7 <5* 11.0  CREATININE 0.82 0.65 0.64 0.8  CALCIUM 8.5* 8.2* 8.3* 9.6  GFRNONAA >60 >60 >60  --   GFRAA >60 >60 >60  --   PROT  --   --   --  8.2  ALBUMIN  --   --   --  3.6  AST  --   --   --  23  ALT  --   --   --  12  ALKPHOS  --   --   --  87  BILITOT  --   --   --  <0.30   Results for LAYLANI, PUDWILL (MRN [id number]) as of 04/14/2015 08:09  Ref. Range 03/23/2015 10:29  Iron Latest Ref Range: 41-142 ug/dL 33 (L)  UIBC Latest Ref Range: 120-384 ug/dL 277  TIBC Latest Ref Range: 236-444 ug/dL 310  %SAT Latest Ref Range: 21-57 % 11 (L)  Ferritin Latest Ref Range: 9-269 ng/ml 33   PATHOLOGY REPORT  Diagnosis 02/15/2015 1. Colon, segmental resection for tumor, right - INVASIVE POORLY DIFFERENTIATED ADENOCARCINOMA, SPANNING 8 CM IN GREATEST DIMENSION. - TUMOR INVADES THROUGH MUSCULARIS PROPRIA TO INVOLVE SUBSEROSAL SOFT TISSUES. - MARGIN IS NEGATIVE. - SIXTEEN BENIGN LYMPH NODES WITH NO TUMOR SEEN (0/16). - SEE ONCOLOGY TEMPLATE. 2. Colon, segmental resection for tumor, sigmoid - INVASIVE WELL DIFFERENTIATED ADENOCARCINOMA WITH ABUNDANT EXTRACELLULAR MUCIN, SPANNING 5.5 CM IN GREATEST DIMENSION. - TUMOR  INVADES THROUGH MUSCULARIS PROPRIA TO INVOLVE SUBSEROSAL SOFT TISSUES. - MARGINS ARE NEGATIVE. - SATELLITE TUMOR DEPOSIT (DISCONTINUOUS EXTRAMURAL EXTENSION) IS PRESENT. - TEN BENIGN LYMPH NODES WITH NO TUMOR SEEN (0/10). - SEE ONCOLOGY TEMPLATE BELOW. 3. Colon, resection margin (donut), distal anastomotic ring - BENIGN COLORECTAL MUCOSA. - NO TUMOR SEEN.  Microscopic Comment 1. COLON AND RECTUM: Specimen: Right colon. Procedure: Right hemicolectomy. Tumor site: Ascending colon. Specimen integrity:  Intact. Macroscopic tumor perforation: Not identified. Invasive tumor: Maximum size: 8 cm. Histologic type(s): Adenocarcinoma. Histologic grade and differentiation: G3: poorly differentiated/high grade. Type of polyp in which invasive carcinoma arose: A definitive precursor polyp is not identified. Microscopic extension of invasive tumor: Tumor invades through muscularis propria to involve subserosal soft tissues. Lymph-Vascular invasion: Definitive lymph/vascular invasion is not identified. Peri-neural invasion: Not identified. Tumor deposit(s) (discontinuous extramural extension): No tumor deposits showing a high grade poorly differentiated adenocarcinoma are identified. Resection margins: Proximal margin: Negative. Distal margin: Negative. Circumferential (radial) (posterior ascending, posterior descending; lateral and posterior mid-rectum; and entire lower 1/3 rectum): Negative. Distance closest margin (if all above margins negative): 4.5 cm (radial margin). Treatment effect (neo-adjuvant therapy): Not applicable. Additional polyp(s): Three additional tubular adenomas are present in the right colon specimen. Non-neoplastic findings: Benign unremarkable appendix. Lymph nodes: number examined: 16 (from the right colon specimen); number positive: 0. Pathologic Staging: pT3, pN0. Ancillary studies: The tumor will be sent for MMR by [place] and MSI by PCR. 2. COLON AND RECTUM: Specimen:  Right partial colon (sigmoid colon). Procedure: Right partial colectomy (sigmoid resection). Tumor site: Sigmoid colon (distal aspect of the specimen along the posterior aspect). Specimen integrity: Intact. Macroscopic tumor perforation: Not identified. Invasive tumor: Maximum size: 5.5 cm. Histologic type(s): Adenocarcinoma with abundant extracellular mucin. Histologic grade and differentiation: G1: well differentiated/low grade. Type of polyp in which invasive carcinoma arose: Tumor arose from a tubular adenoma with high grade glandular dysplasia. Microscopic extension of invasive tumor: Tumor invades through muscularis propria to involve subserosal soft tissue. Lymph-Vascular invasion: Definitive lymph/vascular invasion is not identified, however there is a tumor deposit present, see below. Peri-neural invasion: Not identified. Tumor deposit(s) (discontinuous extramural extension): Yes, tumor deposit is present. Resection margins: Proximal margin: Negative. Distal margin: Negative. Circumferential (radial) (posterior ascending, posterior descending; lateral and posterior mid-rectum; and entire lower 1/3 rectum): 3.1 cm. Distance closest margin (if all above margins negative): 2.4 cm (distal margin). Treatment effect (neo-adjuvant therapy): Not applicable. Additional polyp(s): Benign polypoid colorectal mucosa with hyperplastic-type change (possibly representing early hyperplastic polyp formation) is present. Non-neoplastic findings: No additional non-neoplastic findings Lymph nodes: number examined: 10 (from the sigmoid colon specimen); number positive: 0, see comment. Pathologic Staging: pT3, pN1c. Ancillary studies: The tumor will be sent for MMR by IHC and MSI by PCR per colorectal cancer protocol. Comment: Initially, only 7 lymph nodes are identified in the sigmoid colon specimen. The specimen is placed in fat clearing solution with an additional three lymph nodes identified on  second gross assessment. (RH:kh 02-19-15)  Mismatch Repair (MMR) Protein Immunohistochemistry (IHC) IHC Expression Result: MLH1: LOSS OF NUCLEAR EXPRESSION (LESS THAN 5% TUMOR EXPRESSION) MSH2: Preserved nuclear expression (greater 50% tumor expression) MSH6: Preserved nuclear expression (greater 50% tumor expression) PMS2: LOSS OF NUCLEAR EXPRESSION (LESS THAN 5% TUMOR EXPRESSION) * Internal control demonstrates intact nuclear expression Interpretation: ABNORMAL There is loss of the major and minor MMR proteins MLH1 and PMS2. The loss of expression may be secondary to promoter hyper-methylation, gene mutation or other genetic event. BRAF mutation testing and/or MLH1 methylation testing is indicated. The presence of a BRAF mutation and/or MLH1 hyper-methylation is indicative of a sporadic-type tumor. The absence of either BRAF mutation and/or presence of normal-methylation indicate the possible presence of a hereditary germline mutation (e.g. Lynch syndrome) and referral to genetic counseling is warranted. It is recommended that the loss of protein expression be correlated with molecular based MSI testing.  2. Mismatch Repair (MMR) Protein Immunohistochemistry (IHC) IHC Expression  Result: MLH1: Preserved nuclear expression (greater 50% tumor expression) MSH2: Preserved nuclear expression (greater 50% tumor expression) MSH6: Preserved nuclear expression (greater 50% tumor expression) PMS2: Preserved nuclear expression (greater 50% tumor expression) * Internal control demonstrates intact nuclear expression Interpretation: NORMAL       RADIOGRAPHIC STUDIES: I have personally reviewed the radiological images as listed and agreed with the findings in the report.  CT chest wo contrast 12/29/2014  IMPRESSION: No definite evidence of metastases. There are 2 nonenlarged but questionable right axillary lymph nodes.  CT abdomen and pelvis w contrast 12/29/2014   IMPRESSION: Circumferential mass lesion in the proximal right colon above the ileocecal valve consistent with carcinoma of the colon. There is stranding in the pericolonic fat which could be due to tumor extension. No adenopathy.  Constipation without bowel obstruction.  These results will be called to the ordering clinician or representative by the Radiologist Assistant, and communication documented in the PACS or zVision Dashboard.   ASSESSMENT & PLAN: 75 year old African-American female with past medical history of hypertension, presented with fatigue, anemia and dizziness.  1. Right colon cancer, pT3N0M0 stage IIA, poorly differentiated,MSI-high, and sigmoid-rectal colon cancer, pT3N1cM0 stage IIIB, well differentiated,MSI-stable  -I reviewed her surgical pathology findings with patient and her husband in great details. She has 2 multifocal colon adenocarcinoma was distinct features. Her right colon cancer is stage IIA, poorly differentiated, MSI high, has P ref mutation and MLH-1 hypervascular mass lesion, which supports a sporadic colon cancer, unlikely Lynch syndrome. 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 -Her staging CT scan was negative for distant metastasis. Her pre-op CEA was elevated at 7.5. -We discussed the risk of cancer recurrence after her complete surgical resection. The left side colon cancer is more locally advanced, which predicts high risk of cancer recurrence, and standard care for stage III colon cancer is adjuvant chemotherapy. -I discussed the adjuvant chemotherapy options including FOLFOX, or single agent 5-FU or capecitabine. The benefit of oxaliplatin in elderly patients, especially beyond age 51, is probably limited. Although she is 66, she has good baseline health's, performance status, would be a candidate for single agent 5-FU or capecitabine. I recommend capecitabine,  1039m/m2,  twice daily, 2 weeks on, 1 week off, for a total of 6 months. -She has started his first cycle adjuvant Xeloda, tolerating it very well so far, will continue.  2. Hypertension -She is on two BP meds, BP slightly high today -She'll continue follow-up with her primary care physician  3. Anemia, iron deficient from her colon cancer -She required blood transfusion a total of 3 units before her colon surgery -Repeated on study showed low serum iron and saturation, consistent with iron deficiency. -She is on oralferrous sulfate 2 times a day, tolerating well. We'll continue -Her anemia has resolved now   4. Malnutrition and weight loss -much improved, she gained some weight back   5. Thrombocytosis, likely reactive due to iron deficiency  -Improved since since she started taking iron pill.   Plan -Continue Xeloda, she will finish on February 14 -I'll see her back on 2/20 with lab, before her second cycle which will start on 2/22   All questions were answered. The patient knows to call the clinic with any problems, questions or concerns. I spent 15 minutes counseling the patient face to face. The total time spent in the appointment was 20 minutes and more than 50% was on counseling.     FTruitt Merle MD  04/13/2015 11:59 AM

## 2015-04-17 ENCOUNTER — Encounter: Payer: Self-pay | Admitting: Pharmacist

## 2015-04-17 NOTE — Progress Notes (Signed)
..  Oral Chemotherapy Follow-Up Form  Original Start date of oral chemotherapy: _2/1/17_   Called patient today to follow up regarding patient's oral chemotherapy medication: _Xeloda_  Pt is doing well today. Energy levels are better along with appetite. Patient states she has gained 5 lbs. No side effects or issues with Xeloda. Today is Day 14 of cycle 1 and after tonight she will be off for 1 week before cycle 2 begins on 04/25/15. No missed doses  Pt reports __0__ tablets/doses missed in the last week/month.    Pt reports the following side effects: _none____   Will follow up and call patient again in _2 weeks___   Thank you,  Montel Clock, PharmD, [facility]

## 2015-04-23 ENCOUNTER — Encounter: Payer: Self-pay | Admitting: Hematology

## 2015-04-23 ENCOUNTER — Other Ambulatory Visit (HOSPITAL_BASED_OUTPATIENT_CLINIC_OR_DEPARTMENT_OTHER): Payer: Medicare Other

## 2015-04-23 ENCOUNTER — Telehealth: Payer: Self-pay | Admitting: Hematology

## 2015-04-23 ENCOUNTER — Ambulatory Visit (HOSPITAL_BASED_OUTPATIENT_CLINIC_OR_DEPARTMENT_OTHER): Payer: Medicare Other | Admitting: Hematology

## 2015-04-23 VITALS — BP 159/58 | HR 97 | Temp 98.0°F | Resp 18 | Ht 62.0 in | Wt 119.8 lb

## 2015-04-23 DIAGNOSIS — C19 Malignant neoplasm of rectosigmoid junction: Secondary | ICD-10-CM | POA: Diagnosis not present

## 2015-04-23 DIAGNOSIS — D509 Iron deficiency anemia, unspecified: Secondary | ICD-10-CM | POA: Diagnosis not present

## 2015-04-23 DIAGNOSIS — C189 Malignant neoplasm of colon, unspecified: Secondary | ICD-10-CM

## 2015-04-23 DIAGNOSIS — C772 Secondary and unspecified malignant neoplasm of intra-abdominal lymph nodes: Secondary | ICD-10-CM

## 2015-04-23 DIAGNOSIS — C187 Malignant neoplasm of sigmoid colon: Secondary | ICD-10-CM

## 2015-04-23 DIAGNOSIS — C182 Malignant neoplasm of ascending colon: Secondary | ICD-10-CM

## 2015-04-23 DIAGNOSIS — I10 Essential (primary) hypertension: Secondary | ICD-10-CM

## 2015-04-23 DIAGNOSIS — R634 Abnormal weight loss: Secondary | ICD-10-CM

## 2015-04-23 DIAGNOSIS — E46 Unspecified protein-calorie malnutrition: Secondary | ICD-10-CM

## 2015-04-23 LAB — CBC WITH DIFFERENTIAL/PLATELET
BASO%: 0.2 % (ref 0.0–2.0)
BASOS ABS: 0 10*3/uL (ref 0.0–0.1)
EOS%: 3.6 % (ref 0.0–7.0)
Eosinophils Absolute: 0.3 10*3/uL (ref 0.0–0.5)
HEMATOCRIT: 37.7 % (ref 34.8–46.6)
HGB: 11.9 g/dL (ref 11.6–15.9)
LYMPH#: 2.3 10*3/uL (ref 0.9–3.3)
LYMPH%: 28.7 % (ref 14.0–49.7)
MCH: 24.9 pg — AB (ref 25.1–34.0)
MCHC: 31.6 g/dL (ref 31.5–36.0)
MCV: 78.8 fL — ABNORMAL LOW (ref 79.5–101.0)
MONO#: 0.8 10*3/uL (ref 0.1–0.9)
MONO%: 9.7 % (ref 0.0–14.0)
NEUT#: 4.6 10*3/uL (ref 1.5–6.5)
NEUT%: 57.8 % (ref 38.4–76.8)
PLATELETS: 351 10*3/uL (ref 145–400)
RBC: 4.78 10*6/uL (ref 3.70–5.45)
RDW: 20.1 % — ABNORMAL HIGH (ref 11.2–14.5)
WBC: 7.9 10*3/uL (ref 3.9–10.3)

## 2015-04-23 LAB — COMPREHENSIVE METABOLIC PANEL
ALT: 10 U/L (ref 0–55)
ANION GAP: 8 meq/L (ref 3–11)
AST: 19 U/L (ref 5–34)
Albumin: 3.3 g/dL — ABNORMAL LOW (ref 3.5–5.0)
Alkaline Phosphatase: 61 U/L (ref 40–150)
BUN: 13.1 mg/dL (ref 7.0–26.0)
CALCIUM: 9.2 mg/dL (ref 8.4–10.4)
CHLORIDE: 104 meq/L (ref 98–109)
CO2: 27 meq/L (ref 22–29)
Creatinine: 0.8 mg/dL (ref 0.6–1.1)
EGFR: 83 mL/min/{1.73_m2} — ABNORMAL LOW (ref >90)
Glucose: 118 mg/dl (ref 70–140)
POTASSIUM: 4 meq/L (ref 3.5–5.1)
Sodium: 138 mEq/L (ref 136–145)
Total Bilirubin: <0.3 mg/dL (ref 0.20–1.20)
Total Protein: 7.1 g/dL (ref 6.4–8.3)

## 2015-04-23 MED FILL — CAPECITABINE 500 MG TABLET: 500 | 14 days supply | Qty: 84 | Fill #1

## 2015-04-23 NOTE — Progress Notes (Signed)
Pt is approved for the $400 [facility] grant.  °

## 2015-04-23 NOTE — Progress Notes (Signed)
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 Colonoscopy showed a ulcerated completely obstructing large mass in the rectosigmoid colon, 15 cm from anus. Diverticulosis in the rectosigmoid colon. Biopsy was taken from the mass.   12/29/2014 Imaging CT chest, abdomen and pelvis showed a circumferential mass lesion in the proximal right colon above the ileocecal valve consistent with carcinoma of the colon. No adenopathy. No distant metastasis.   02/15/2015 Initial Diagnosis Cancer of right colon ([place])   02/15/2015 Surgery Laparoscopic right colectomy and sigmoidectomy   02/15/2015 Pathology Results Right colon segmental resection showed a invasive poorly differentiated adenocarcinoma, spanning 8 cm, T3, margins were negative, 16 lymph nodes. No lymphovascular invasion, no perineural invasion. MMR showed loss of expression of ML H1 and PMS2, MSI-H   02/15/2015 Pathology Results Sigmoid colon segmental resection showed invasive well differentiated adenocarcinoma, 5.5 cm, T3, 10 lymph nodes were negative, (+) tumor deposit. 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 female is here because of her recently diagnosed colon cancer. She is accompanied by her husband to our multidisciplinary GI clinic today.  She presented with fatigue and dizzines in 10/2014, she also had decreased appetite, mild intermittent blood in stool, moderate constipation, and weight loss about 30lbs. She was seen by her primary care physician, was found to be anemic, and required blood transfusion in October 2016. She was referred to gastroenterologist Dr. Oletta Lamas, and underwent EGD and colonoscopy on 12/21/2014, which showed a ulcerated mass in the sigmoid-rectum, 15 cm from anal verge, in the scope was not able to advance. Multiple biopsy from the sigmoid colon mass showed invasive adenocarcinoma. CT of the chest abdomen and pelvis was obtained, which showed a circumferential mass lesion in the proximal right colon above the ileocecal valve. She was referred to colorectal surgeon Dr. Marcello Moores, and underwent left scopic right colectomy and sigmoidectomy on 02/15/2015. She was discharged home 5 days after her surgery.  She has revoered very well from her surgery. She has greast appetite, eats well, normal BM, takes miralax as needed, no pain gained 6-7 lbs weight back since the surgery. She has good energy level, and remains to be physically active at home.  She has no children, no family history of colon cancer or other malignancy except her father had brain tumor.  CURRENT THERAPY: Xeloda 1569m bid, 2  weeks on, 1 week off, started on 04/04/2015, plan for 8 cycles   INTERIM HISTORY:  Olivia Jackson returns for follow-up. She has completed the first cycle Xeloda on February 14, and is off this week. She tolerated the chemotherapy very well, denies any  side effects or complications. She has good appetite and eats well. She has good energy level and remains to be physically active at home. No nausea, diarrhea, pain, or other complaints.  MEDICAL HISTORY:  Past Medical History  Diagnosis Date  . Hypertension   . Falls     pt states fell twice this week   . History of blood transfusion     12/2014  . Anemia     SURGICAL HISTORY: Past Surgical History  Procedure Laterality Date  . Dilation and curettage of uterus    . Colonscopy     . Upper gi endoscopy    . Laparoscopic partial colectomy N/A 02/15/2015    Procedure: LAPAROSCOPIC RIGHT COLECTOMY AND SIGMOIDECTOMY PROCTOSCOPY;  Surgeon: Leighton Ruff, MD;  Location: [facility];  Service: General;  Laterality: N/A;  . Upper gi endoscopy  02/15/2015    Procedure: UPPER GI ENDOSCOPY;  Surgeon: Leighton Ruff, MD;  Location: [facility];  Service: General;;    SOCIAL HISTORY: Social History   Social History  . Marital Status: Married    Spouse Name: N/A  . Number of Children: N/A  . Years of Education: N/A   Occupational History  . Not on file.   Social History Main Topics  . Smoking status: Former Smoker -- 0.50 packs/day for 20 years    Types: Cigarettes    Quit date: 03/04/1995  . Smokeless tobacco: Never Used  . Alcohol Use: No  . Drug Use: No  . Sexual Activity: Not on file   Other Topics Concern  . Not on file   Social History Narrative   Married, husband [name]   No children   Independent ADLs, drives    FAMILY HISTORY: Family History  Problem Relation Age of Onset  . Cancer Father 52    brain tumor     ALLERGIES:  has No Known Allergies.  MEDICATIONS:  Current Outpatient Prescriptions  Medication Sig Dispense Refill  . acetaminophen (TYLENOL) 325 MG tablet Take 2 tablets (650 mg total) by mouth every 6 (six) hours as needed.    Marland Kitchen amLODipine (NORVASC) 10 MG tablet     . capecitabine (XELODA) 500 MG tablet Take 3 tablets (1,500 mg total) by mouth 2 (two)  times daily after a meal. 84 tablet 3  . ferrous sulfate 325 (65 FE) MG tablet Take 325 mg by mouth 2 (two) times daily.  3  . polyethylene glycol (MIRALAX / GLYCOLAX) packet Take 17 g by mouth daily as needed for mild constipation, moderate constipation or severe constipation.    . triamterene-hydrochlorothiazide (MAXZIDE-25) 37.5-25 MG tablet Take 0.5 tablets by mouth daily.  3   No current facility-administered medications for this visit.    REVIEW OF SYSTEMS:   Constitutional: Denies fevers, chills or abnormal night sweats Eyes: Denies blurriness of vision, double vision or watery eyes Ears, nose, mouth, throat, and face: Denies mucositis or sore throat Respiratory: Denies cough, dyspnea or wheezes Cardiovascular: Denies palpitation, chest discomfort or lower extremity swelling Gastrointestinal:  Denies nausea, heartburn or change in bowel habits Skin: Denies abnormal skin rashes Lymphatics: Denies new lymphadenopathy or easy bruising Neurological:Denies numbness, tingling or new weaknesses Behavioral/Psych: Mood is stable, no new changes  All other systems were  reviewed with the patient and are negative.  PHYSICAL EXAMINATION: ECOG PERFORMANCE STATUS: 1 - Symptomatic but completely ambulatory  Filed Vitals:   04/23/15 1303  BP: 159/58  Pulse: 97  Temp: 98 F (36.7 C)  Resp: 18   Filed Weights   04/23/15 1303  Weight: 119 lb 12.8 oz (54.341 kg)    GENERAL:alert, no distress and comfortable SKIN: skin color, texture, turgor are normal, no rashes or significant lesions EYES: normal, conjunctiva are pink and non-injected, sclera clear OROPHARYNX:no exudate, no erythema and lips, buccal mucosa, and tongue normal  NECK: supple, thyroid normal size, non-tender, without nodularity LYMPH:  no palpable lymphadenopathy in the cervical, axillary or inguinal LUNGS: clear to auscultation and percussion with normal breathing effort HEART: regular rate & rhythm and no murmurs and no  lower extremity edema ABDOMEN:abdomen soft, non-tender and normal bowel sounds. Midline surgical scar and laparoscopic surgical scars are all well healed, no skin erythema or discharge. Musculoskeletal:no cyanosis of digits and no clubbing  PSYCH: alert & oriented x 3 with fluent speech NEURO: no focal motor/sensory deficits  LABORATORY DATA:  I have reviewed the data as listed CBC Latest Ref Rng 04/23/2015 04/13/2015 03/23/2015  WBC 3.9 - 10.3 10e3/uL 7.9 7.5 9.5  Hemoglobin 11.6 - 15.9 g/dL 11.9 12.0 11.7  Hematocrit 34.8 - 46.6 % 37.7 38.1 37.5  Platelets 145 - 400 10e3/uL 351 409(H) 456(H)     Recent Labs  02/16/15 0433 02/17/15 0513 02/18/15 0543 03/23/15 1029 04/13/15 1117 04/23/15 1236  NA 136 136 137 138 138 138  K 3.2* 3.6 3.9 3.7 3.7 4.0  CL 101 103 103  --   --   --   CO2 _0 GLUCOSE 99 93 95 73 83 118  BUN 9 7 <5* 11.0 19.6 13.1  CREATININE 0.82 0.65 0.64 0.8 0.8 0.8  CALCIUM 8.5* 8.2* 8.3* 9.6 9.5 9.2  GFRNONAA >60 >60 >60  --   --   --   GFRAA >60 >60 >60  --   --   --   PROT  --   --   --  8.2 7.9 7.1  ALBUMIN  --   --   --  3.6 3.6 3.3*  AST  --   --   --  _1 ALT  --   --   --  _2 ALKPHOS  --   --   --  66 70 61  BILITOT  --   --   --  <0.30 <0.30 <0.30   Results for Olivia Jackson, Olivia Jackson (MRN [id number]) as of 04/14/2015 08:09  Ref. Range 03/23/2015 10:29  Iron Latest Ref Range: 41-142 ug/dL 33 (L)  UIBC Latest Ref Range: 120-384 ug/dL 277  TIBC Latest Ref Range: 236-444 ug/dL 310  %SAT Latest Ref Range: 21-57 % 11 (L)  Ferritin Latest Ref Range: 9-269 ng/ml 33   PATHOLOGY REPORT  Diagnosis 02/15/2015 1. Colon, segmental resection for tumor, right - INVASIVE POORLY DIFFERENTIATED ADENOCARCINOMA, SPANNING 8 CM IN GREATEST DIMENSION. - TUMOR INVADES THROUGH MUSCULARIS PROPRIA TO INVOLVE SUBSEROSAL SOFT TISSUES. - MARGIN IS NEGATIVE. - SIXTEEN BENIGN LYMPH NODES WITH NO TUMOR SEEN (0/16). - SEE ONCOLOGY TEMPLATE. 2. Colon,  segmental resection for tumor, sigmoid - INVASIVE WELL DIFFERENTIATED ADENOCARCINOMA WITH ABUNDANT EXTRACELLULAR MUCIN, SPANNING 5.5 CM IN GREATEST DIMENSION. - TUMOR INVADES THROUGH MUSCULARIS PROPRIA TO INVOLVE SUBSEROSAL SOFT TISSUES. - MARGINS ARE NEGATIVE. - SATELLITE TUMOR DEPOSIT (DISCONTINUOUS EXTRAMURAL EXTENSION)  IS PRESENT. - TEN BENIGN LYMPH NODES WITH NO TUMOR SEEN (0/10). - SEE ONCOLOGY TEMPLATE BELOW. 3. Colon, resection margin (donut), distal anastomotic ring - BENIGN COLORECTAL MUCOSA. - NO TUMOR SEEN.  Microscopic Comment 1. COLON AND RECTUM: Specimen: Right colon. Procedure: Right hemicolectomy. Tumor site: Ascending colon. Specimen integrity: Intact. Macroscopic tumor perforation: Not identified. Invasive tumor: Maximum size: 8 cm. Histologic type(s): Adenocarcinoma. Histologic grade and differentiation: G3: poorly differentiated/high grade. Type of polyp in which invasive carcinoma arose: A definitive precursor polyp is not identified. Microscopic extension of invasive tumor: Tumor invades through muscularis propria to involve subserosal soft tissues. Lymph-Vascular invasion: Definitive lymph/vascular invasion is not identified. Peri-neural invasion: Not identified. Tumor deposit(s) (discontinuous extramural extension): No tumor deposits showing a high grade poorly differentiated adenocarcinoma are identified. Resection margins: Proximal margin: Negative. Distal margin: Negative. Circumferential (radial) (posterior ascending, posterior descending; lateral and posterior mid-rectum; and entire lower 1/3 rectum): Negative. Distance closest margin (if all above margins negative): 4.5 cm (radial margin). Treatment effect (neo-adjuvant therapy): Not applicable. Additional polyp(s): Three additional tubular adenomas are present in the right colon specimen. Non-neoplastic findings: Benign unremarkable appendix. Lymph nodes: number examined: 16 (from the right colon  specimen); number positive: 0. Pathologic Staging: pT3, pN0. Ancillary studies: The tumor will be sent for MMR by [place] and MSI by PCR. 2. COLON AND RECTUM: Specimen: Right partial colon (sigmoid colon). Procedure: Right partial colectomy (sigmoid resection). Tumor site: Sigmoid colon (distal aspect of the specimen along the posterior aspect). Specimen integrity: Intact. Macroscopic tumor perforation: Not identified. Invasive tumor: Maximum size: 5.5 cm. Histologic type(s): Adenocarcinoma with abundant extracellular mucin. Histologic grade and differentiation: G1: well differentiated/low grade. Type of polyp in which invasive carcinoma arose: Tumor arose from a tubular adenoma with high grade glandular dysplasia. Microscopic extension of invasive tumor: Tumor invades through muscularis propria to involve subserosal soft tissue. Lymph-Vascular invasion: Definitive lymph/vascular invasion is not identified, however there is a tumor deposit present, see below. Peri-neural invasion: Not identified. Tumor deposit(s) (discontinuous extramural extension): Yes, tumor deposit is present. Resection margins: Proximal margin: Negative. Distal margin: Negative. Circumferential (radial) (posterior ascending, posterior descending; lateral and posterior mid-rectum; and entire lower 1/3 rectum): 3.1 cm. Distance closest margin (if all above margins negative): 2.4 cm (distal margin). Treatment effect (neo-adjuvant therapy): Not applicable. Additional polyp(s): Benign polypoid colorectal mucosa with hyperplastic-type change (possibly representing early hyperplastic polyp formation) is present. Non-neoplastic findings: No additional non-neoplastic findings Lymph nodes: number examined: 10 (from the sigmoid colon specimen); number positive: 0, see comment. Pathologic Staging: pT3, pN1c. Ancillary studies: The tumor will be sent for MMR by IHC and MSI by PCR per colorectal cancer protocol. Comment:  Initially, only 7 lymph nodes are identified in the sigmoid colon specimen. The specimen is placed in fat clearing solution with an additional three lymph nodes identified on second gross assessment. (RH:kh 02-19-15)  Mismatch Repair (MMR) Protein Immunohistochemistry (IHC) IHC Expression Result: MLH1: LOSS OF NUCLEAR EXPRESSION (LESS THAN 5% TUMOR EXPRESSION) MSH2: Preserved nuclear expression (greater 50% tumor expression) MSH6: Preserved nuclear expression (greater 50% tumor expression) PMS2: LOSS OF NUCLEAR EXPRESSION (LESS THAN 5% TUMOR EXPRESSION) * Internal control demonstrates intact nuclear expression Interpretation: ABNORMAL There is loss of the major and minor MMR proteins MLH1 and PMS2. The loss of expression may be secondary to promoter hyper-methylation, gene mutation or other genetic event. BRAF mutation testing and/or MLH1 methylation testing is indicated. The presence of a BRAF mutation and/or MLH1 hyper-methylation is indicative of a sporadic-type tumor. The absence of  either BRAF mutation and/or presence of normal-methylation indicate the possible presence of a hereditary germline mutation (e.g. Lynch syndrome) and referral to genetic counseling is warranted. It is recommended that the loss of protein expression be correlated with molecular based MSI testing.  2. Mismatch Repair (MMR) Protein Immunohistochemistry (IHC) IHC Expression Result: MLH1: Preserved nuclear expression (greater 50% tumor expression) MSH2: Preserved nuclear expression (greater 50% tumor expression) MSH6: Preserved nuclear expression (greater 50% tumor expression) PMS2: Preserved nuclear expression (greater 50% tumor expression) * Internal control demonstrates intact nuclear expression Interpretation: NORMAL       RADIOGRAPHIC STUDIES: I have personally reviewed the radiological images as listed and agreed with the findings in the report.  CT chest wo contrast 12/29/2014  IMPRESSION: No  definite evidence of metastases. There are 2 nonenlarged but questionable right axillary lymph nodes.  CT abdomen and pelvis w contrast 12/29/2014  IMPRESSION: Circumferential mass lesion in the proximal right colon above the ileocecal valve consistent with carcinoma of the colon. There is stranding in the pericolonic fat which could be due to tumor extension. No adenopathy.  Constipation without bowel obstruction.  These results will be called to the ordering clinician or representative by the Radiologist Assistant, and communication documented in the PACS or zVision Dashboard.   ASSESSMENT & PLAN: 75 year old African-American female with past medical history of hypertension, presented with fatigue, anemia and dizziness.  1. Right colon cancer, pT3N0M0 stage IIA, poorly differentiated,MSI-high, and sigmoid-rectal colon cancer, pT3N1cM0 stage IIIB, well differentiated,MSI-stable  -I reviewed her surgical pathology findings with patient and her husband in great details. She has 2 multifocal colon adenocarcinoma was distinct features. Her right colon cancer is stage IIA, poorly differentiated, MSI high, has P ref mutation and MLH-1 hypervascular mass lesion, which supports a sporadic colon cancer, unlikely Lynch syndrome. 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 -Her staging CT scan was negative for distant metastasis. Her pre-op CEA was elevated at 7.5, post op CEA came down to 1.7 (normal) -She has started adjuvant chemotherapy Xeloda, tolerated the first cycle very well, no complaints.  -Lab results reviewed with her, she will start second cycle on 2/23.   2. Hypertension -She'll continue follow-up with her primary care physician  3. Anemia, iron deficient from her colon cancer -She required blood transfusion a total of 3 units before her colon surgery -Repeated on study showed ferritin 33, mildly low serum  iron and saturation, consistent with mild iron deficiency. -She is on oralferrous sulfate 2 times a day, tolerating well. We'll continue -Her anemia has resolved now   4. Malnutrition and weight loss -much improved, she gained some weight back    Plan -Continue Xeloda, she will start cycle 2 on 2/22  -I will see her back in 3 weeks before cycle 3 with lab   All questions were answered. The patient knows to call the clinic with any problems, questions or concerns. I spent 15 minutes counseling the patient face to face. The total time spent in the appointment was 20 minutes and more than 50% was on counseling.     Truitt Merle, MD 04/23/2015

## 2015-04-23 NOTE — Telephone Encounter (Signed)
Pt confirmed labs/ov per 02/20 POF, gave pt AVS and Calendar... KJ °

## 2015-04-26 ENCOUNTER — Encounter: Payer: Self-pay | Admitting: Hematology

## 2015-05-04 ENCOUNTER — Telehealth: Payer: Self-pay | Admitting: Pharmacist

## 2015-05-04 NOTE — Telephone Encounter (Signed)
05/04/15: Attempted to reach patient for follow up on oral medication: Xeloda. No answer. Left VM for patient to call back with any questions or issues.   Thank you,  Montel Clock, PharmD, [facility] [phone]

## 2015-05-14 ENCOUNTER — Ambulatory Visit (HOSPITAL_BASED_OUTPATIENT_CLINIC_OR_DEPARTMENT_OTHER): Payer: Medicare Other | Admitting: Hematology

## 2015-05-14 ENCOUNTER — Encounter: Payer: Self-pay | Admitting: Hematology

## 2015-05-14 ENCOUNTER — Telehealth: Payer: Self-pay | Admitting: Hematology

## 2015-05-14 ENCOUNTER — Other Ambulatory Visit (HOSPITAL_BASED_OUTPATIENT_CLINIC_OR_DEPARTMENT_OTHER): Payer: Medicare Other

## 2015-05-14 VITALS — BP 140/74 | HR 94 | Temp 97.7°F | Resp 18 | Ht 62.0 in | Wt 124.3 lb

## 2015-05-14 DIAGNOSIS — C772 Secondary and unspecified malignant neoplasm of intra-abdominal lymph nodes: Secondary | ICD-10-CM | POA: Diagnosis not present

## 2015-05-14 DIAGNOSIS — D509 Iron deficiency anemia, unspecified: Secondary | ICD-10-CM | POA: Diagnosis not present

## 2015-05-14 DIAGNOSIS — E46 Unspecified protein-calorie malnutrition: Secondary | ICD-10-CM

## 2015-05-14 DIAGNOSIS — C187 Malignant neoplasm of sigmoid colon: Secondary | ICD-10-CM | POA: Diagnosis not present

## 2015-05-14 DIAGNOSIS — I10 Essential (primary) hypertension: Secondary | ICD-10-CM

## 2015-05-14 DIAGNOSIS — R634 Abnormal weight loss: Secondary | ICD-10-CM

## 2015-05-14 DIAGNOSIS — C182 Malignant neoplasm of ascending colon: Secondary | ICD-10-CM

## 2015-05-14 LAB — CBC WITH DIFFERENTIAL/PLATELET
BASO%: 0.2 % (ref 0.0–2.0)
BASOS ABS: 0 10*3/uL (ref 0.0–0.1)
EOS ABS: 0.3 10*3/uL (ref 0.0–0.5)
EOS%: 3.2 % (ref 0.0–7.0)
HEMATOCRIT: 39.5 % (ref 34.8–46.6)
HEMOGLOBIN: 12.5 g/dL (ref 11.6–15.9)
LYMPH#: 2.3 10*3/uL (ref 0.9–3.3)
LYMPH%: 28.9 % (ref 14.0–49.7)
MCH: 25.8 pg (ref 25.1–34.0)
MCHC: 31.7 g/dL (ref 31.5–36.0)
MCV: 81.2 fL (ref 79.5–101.0)
MONO#: 0.9 10*3/uL (ref 0.1–0.9)
MONO%: 11.5 % (ref 0.0–14.0)
NEUT%: 56.2 % (ref 38.4–76.8)
NEUTROS ABS: 4.6 10*3/uL (ref 1.5–6.5)
PLATELETS: 311 10*3/uL (ref 145–400)
RBC: 4.86 10*6/uL (ref 3.70–5.45)
RDW: 28.1 % — AB (ref 11.2–14.5)
WBC: 8.1 10*3/uL (ref 3.9–10.3)

## 2015-05-14 LAB — COMPREHENSIVE METABOLIC PANEL
ALBUMIN: 3.5 g/dL (ref 3.5–5.0)
ALK PHOS: 68 U/L (ref 40–150)
ALT: 13 U/L (ref 0–55)
ANION GAP: 8 meq/L (ref 3–11)
AST: 22 U/L (ref 5–34)
BILIRUBIN TOTAL: 0.76 mg/dL (ref 0.20–1.20)
BUN: 14.1 mg/dL (ref 7.0–26.0)
CALCIUM: 9.4 mg/dL (ref 8.4–10.4)
CO2: 28 mEq/L (ref 22–29)
Chloride: 104 mEq/L (ref 98–109)
Creatinine: 0.9 mg/dL (ref 0.6–1.1)
EGFR: 70 mL/min/{1.73_m2} — AB (ref >90)
Glucose: 107 mg/dl (ref 70–140)
POTASSIUM: 3.9 meq/L (ref 3.5–5.1)
Sodium: 139 mEq/L (ref 136–145)
TOTAL PROTEIN: 7.4 g/dL (ref 6.4–8.3)

## 2015-05-14 MED FILL — CAPECITABINE 500 MG TABLET: 500 | 14 days supply | Qty: 84 | Fill #2

## 2015-05-14 NOTE — Telephone Encounter (Signed)
per pof to sch pt appt-gave pt copy of avs °

## 2015-05-14 NOTE — Progress Notes (Signed)
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)   Staging form: Colon and Rectum, AJCC 7th Edition     Pathologic stage from 02/15/2015: Stage IIIB (T3, N1c, cM0) - Signed by Truitt Merle, MD on 03/23/2015        Cancer of right colon ([place])   12/21/2014 Procedure EGD was normal. Colonoscopy showed a ulcerated completely obstructing large mass in the rectosigmoid colon, 15 cm from anus. Diverticulosis in the rectosigmoid colon. Biopsy was taken from the mass.   12/29/2014 Imaging CT chest, abdomen and pelvis showed a circumferential mass lesion in the proximal right colon above the ileocecal valve consistent with carcinoma of the colon. No adenopathy. No distant metastasis.   02/15/2015 Initial Diagnosis Cancer of right colon ([place])   02/15/2015 Surgery Laparoscopic right colectomy and sigmoidectomy   02/15/2015 Pathology Results Right colon segmental resection showed a invasive poorly differentiated adenocarcinoma, spanning 8 cm, T3, margins were negative, 16 lymph nodes. No lymphovascular invasion, no perineural invasion. MMR showed loss of expression of ML H1 and PMS2, MSI-H   02/15/2015 Pathology Results Sigmoid colon segmental resection showed invasive well differentiated adenocarcinoma, 5.5 cm, T3, 10 lymph nodes were negative, (+) tumor deposit. 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 female is here because of her recently diagnosed colon cancer. She is accompanied by her husband to our multidisciplinary GI clinic today.  She presented with fatigue and dizzines in 10/2014, she also had decreased appetite, mild intermittent blood in stool, moderate constipation, and weight loss about 30lbs. She was seen by her primary care physician, was found to be anemic, and required blood transfusion in October 2016. She was referred to gastroenterologist Dr. Oletta Lamas, and underwent EGD and colonoscopy on 12/21/2014, which showed a ulcerated mass in the sigmoid-rectum, 15 cm from anal verge, in the scope was not able to advance. Multiple biopsy from the sigmoid colon mass showed invasive adenocarcinoma. CT of the chest abdomen and pelvis was obtained, which showed a circumferential mass lesion in the proximal right colon above the ileocecal valve. She was referred to colorectal surgeon Dr. Marcello Moores, and underwent left scopic right colectomy and sigmoidectomy on 02/15/2015. She was discharged home 5 days after her surgery.  She has revoered very well from her surgery. She has greast appetite, eats well, normal BM, takes miralax as needed, no pain gained 6-7 lbs weight back since the surgery. She has good energy level, and remains to be physically active at home.  She has no children, no family history of colon cancer or other malignancy except her father had brain tumor.  CURRENT THERAPY: Xeloda 1560m bid, 2  weeks on, 1 week off, started on 04/04/2015, plan for 8 cycles   INTERIM HISTORY:  Drena returns for follow-up. She Is doing very well clinically. She completed cycle 2 Xeloda last week, had no noticeable side effects. She has good appetite,  eating very well, has gained 5 pounds in the past few months. She denies any pain, nausea, abnormal bowel movement, skin rash, or other symptoms. She has good energy level, remains physically active, and has very positive attitude.  MEDICAL HISTORY:  Past Medical History  Diagnosis Date  . Hypertension   . Falls     pt states fell twice this week   . History of blood transfusion     12/2014  . Anemia     SURGICAL HISTORY: Past Surgical History  Procedure Laterality Date  . Dilation and curettage of uterus    . Colonscopy     . Upper gi endoscopy    . Laparoscopic partial colectomy N/A 02/15/2015    Procedure: LAPAROSCOPIC RIGHT COLECTOMY AND SIGMOIDECTOMY PROCTOSCOPY;  Surgeon: Leighton Ruff, MD;  Location: [facility];  Service: General;  Laterality: N/A;  . Upper gi endoscopy  02/15/2015    Procedure: UPPER GI ENDOSCOPY;  Surgeon: Leighton Ruff, MD;  Location: [facility];  Service: General;;    SOCIAL HISTORY: Social History   Social History  . Marital Status: Married    Spouse Name: N/A  . Number of Children: N/A  . Years of Education: N/A   Occupational History  . Not on file.   Social History Main Topics  . Smoking status: Former Smoker -- 0.50 packs/day for 20 years    Types: Cigarettes    Quit date: 03/04/1995  . Smokeless tobacco: Never Used  . Alcohol Use: No  . Drug Use: No  . Sexual Activity: Not on file   Other Topics Concern  . Not on file   Social History Narrative   Married, husband [name]   No children   Independent ADLs, drives    FAMILY HISTORY: Family History  Problem Relation Age of Onset  . Cancer Father 51    brain tumor     ALLERGIES:  has No Known Allergies.  MEDICATIONS:  Current Outpatient Prescriptions  Medication Sig Dispense Refill  . acetaminophen (TYLENOL) 325 MG tablet Take 2 tablets (650 mg total) by mouth every 6 (six) hours as needed.    . capecitabine (XELODA) 500 MG tablet Take 3 tablets (1,500 mg total) by mouth 2 (two)  times daily after a meal. 84 tablet 3  . ferrous sulfate 325 (65 FE) MG tablet Take 325 mg by mouth 2 (two) times daily.  3  . polyethylene glycol (MIRALAX / GLYCOLAX) packet Take 17 g by mouth daily as needed for mild constipation, moderate constipation or severe constipation.    . triamterene-hydrochlorothiazide (MAXZIDE-25) 37.5-25 MG tablet Take 0.5 tablets by mouth daily.  3   No current facility-administered medications for this visit.    REVIEW OF SYSTEMS:   Constitutional: Denies fevers, chills or abnormal night sweats Eyes: Denies blurriness of vision, double vision or watery eyes Ears, nose, mouth, throat, and face: Denies mucositis or sore throat Respiratory: Denies cough, dyspnea or wheezes Cardiovascular: Denies palpitation, chest discomfort or lower extremity swelling Gastrointestinal:  Denies nausea, heartburn or change in bowel habits Skin: Denies abnormal skin rashes Lymphatics: Denies new lymphadenopathy or easy bruising Neurological:Denies numbness, tingling or new weaknesses Behavioral/Psych: Mood is stable, no new changes  All other systems were reviewed with the patient  and are negative.  PHYSICAL EXAMINATION: ECOG PERFORMANCE STATUS: 0  Filed Vitals:   05/14/15 1347  BP: 140/74  Pulse: 94  Temp: 97.7 F (36.5 C)  Resp: 18   Filed Weights   05/14/15 1347  Weight: 124 lb 4.8 oz (56.382 kg)    GENERAL:alert, no distress and comfortable SKIN: skin color, texture, turgor are normal, no rashes or significant lesions EYES: normal, conjunctiva are pink and non-injected, sclera clear OROPHARYNX:no exudate, no erythema and lips, buccal mucosa, and tongue normal  NECK: supple, thyroid normal size, non-tender, without nodularity LYMPH:  no palpable lymphadenopathy in the cervical, axillary or inguinal LUNGS: clear to auscultation and percussion with normal breathing effort HEART: regular rate & rhythm and no murmurs and no lower extremity edema ABDOMEN:abdomen  soft, non-tender and normal bowel sounds. Midline surgical scar and laparoscopic surgical scars are all well healed, no skin erythema or discharge. Musculoskeletal:no cyanosis of digits and no clubbing  PSYCH: alert & oriented x 3 with fluent speech NEURO: no focal motor/sensory deficits  LABORATORY DATA:  I have reviewed the data as listed CBC Latest Ref Rng 05/14/2015 04/23/2015 04/13/2015  WBC 3.9 - 10.3 10e3/uL 8.1 7.9 7.5  Hemoglobin 11.6 - 15.9 g/dL 12.5 11.9 12.0  Hematocrit 34.8 - 46.6 % 39.5 37.7 38.1  Platelets 145 - 400 10e3/uL 311 351 409(H)     Recent Labs  02/16/15 0433 02/17/15 0513 02/18/15 0543 03/23/15 1029 04/13/15 1117 04/23/15 1236  NA 136 136 137 138 138 138  K 3.2* 3.6 3.9 3.7 3.7 4.0  CL 101 103 103  --   --   --   CO2 '24 28 28 26 25 27  ' GLUCOSE 99 93 95 73 83 118  BUN 9 7 <5* 11.0 19.6 13.1  CREATININE 0.82 0.65 0.64 0.8 0.8 0.8  CALCIUM 8.5* 8.2* 8.3* 9.6 9.5 9.2  GFRNONAA >60 >60 >60  --   --   --   GFRAA >60 >60 >60  --   --   --   PROT  --   --   --  8.2 7.9 7.1  ALBUMIN  --   --   --  3.6 3.6 3.3*  AST  --   --   --  '23 23 19  ' ALT  --   --   --  '12 15 10  ' ALKPHOS  --   --   --  66 70 61  BILITOT  --   --   --  <0.30 <0.30 <0.30    PATHOLOGY REPORT  Diagnosis 02/15/2015 1. Colon, segmental resection for tumor, right - INVASIVE POORLY DIFFERENTIATED ADENOCARCINOMA, SPANNING 8 CM IN GREATEST DIMENSION. - TUMOR INVADES THROUGH MUSCULARIS PROPRIA TO INVOLVE SUBSEROSAL SOFT TISSUES. - MARGIN IS NEGATIVE. - SIXTEEN BENIGN LYMPH NODES WITH NO TUMOR SEEN (0/16). - SEE ONCOLOGY TEMPLATE. 2. Colon, segmental resection for tumor, sigmoid - INVASIVE WELL DIFFERENTIATED ADENOCARCINOMA WITH ABUNDANT EXTRACELLULAR MUCIN, SPANNING 5.5 CM IN GREATEST DIMENSION. - TUMOR INVADES THROUGH MUSCULARIS PROPRIA TO INVOLVE SUBSEROSAL SOFT TISSUES. - MARGINS ARE NEGATIVE. - SATELLITE TUMOR DEPOSIT (DISCONTINUOUS EXTRAMURAL EXTENSION) IS PRESENT. - TEN BENIGN LYMPH  NODES WITH NO TUMOR SEEN (0/10). - SEE ONCOLOGY TEMPLATE BELOW. 3. Colon, resection margin (donut), distal anastomotic ring - BENIGN COLORECTAL MUCOSA. - NO TUMOR SEEN.  Microscopic Comment 1. COLON AND RECTUM: Specimen: Right colon. Procedure: Right hemicolectomy. Tumor site: Ascending colon. Specimen integrity: Intact. Macroscopic tumor perforation: Not identified. Invasive tumor: Maximum size: 8 cm. Histologic type(s): Adenocarcinoma. Histologic  grade and differentiation: G3: poorly differentiated/high grade. Type of polyp in which invasive carcinoma arose: A definitive precursor polyp is not identified. Microscopic extension of invasive tumor: Tumor invades through muscularis propria to involve subserosal soft tissues. Lymph-Vascular invasion: Definitive lymph/vascular invasion is not identified. Peri-neural invasion: Not identified. Tumor deposit(s) (discontinuous extramural extension): No tumor deposits showing a high grade poorly differentiated adenocarcinoma are identified. Resection margins: Proximal margin: Negative. Distal margin: Negative. Circumferential (radial) (posterior ascending, posterior descending; lateral and posterior mid-rectum; and entire lower 1/3 rectum): Negative. Distance closest margin (if all above margins negative): 4.5 cm (radial margin). Treatment effect (neo-adjuvant therapy): Not applicable. Additional polyp(s): Three additional tubular adenomas are present in the right colon specimen. Non-neoplastic findings: Benign unremarkable appendix. Lymph nodes: number examined: 16 (from the right colon specimen); number positive: 0. Pathologic Staging: pT3, pN0. Ancillary studies: The tumor will be sent for MMR by [place] and MSI by PCR. 2. COLON AND RECTUM: Specimen: Right partial colon (sigmoid colon). Procedure: Right partial colectomy (sigmoid resection). Tumor site: Sigmoid colon (distal aspect of the specimen along the posterior aspect). Specimen  integrity: Intact. Macroscopic tumor perforation: Not identified. Invasive tumor: Maximum size: 5.5 cm. Histologic type(s): Adenocarcinoma with abundant extracellular mucin. Histologic grade and differentiation: G1: well differentiated/low grade. Type of polyp in which invasive carcinoma arose: Tumor arose from a tubular adenoma with high grade glandular dysplasia. Microscopic extension of invasive tumor: Tumor invades through muscularis propria to involve subserosal soft tissue. Lymph-Vascular invasion: Definitive lymph/vascular invasion is not identified, however there is a tumor deposit present, see below. Peri-neural invasion: Not identified. Tumor deposit(s) (discontinuous extramural extension): Yes, tumor deposit is present. Resection margins: Proximal margin: Negative. Distal margin: Negative. Circumferential (radial) (posterior ascending, posterior descending; lateral and posterior mid-rectum; and entire lower 1/3 rectum): 3.1 cm. Distance closest margin (if all above margins negative): 2.4 cm (distal margin). Treatment effect (neo-adjuvant therapy): Not applicable. Additional polyp(s): Benign polypoid colorectal mucosa with hyperplastic-type change (possibly representing early hyperplastic polyp formation) is present. Non-neoplastic findings: No additional non-neoplastic findings Lymph nodes: number examined: 10 (from the sigmoid colon specimen); number positive: 0, see comment. Pathologic Staging: pT3, pN1c. Ancillary studies: The tumor will be sent for MMR by IHC and MSI by PCR per colorectal cancer protocol. Comment: Initially, only 7 lymph nodes are identified in the sigmoid colon specimen. The specimen is placed in fat clearing solution with an additional three lymph nodes identified on second gross assessment. (RH:kh 02-19-15)  Mismatch Repair (MMR) Protein Immunohistochemistry (IHC) IHC Expression Result: MLH1: LOSS OF NUCLEAR EXPRESSION (LESS THAN 5% TUMOR  EXPRESSION) MSH2: Preserved nuclear expression (greater 50% tumor expression) MSH6: Preserved nuclear expression (greater 50% tumor expression) PMS2: LOSS OF NUCLEAR EXPRESSION (LESS THAN 5% TUMOR EXPRESSION) * Internal control demonstrates intact nuclear expression Interpretation: ABNORMAL There is loss of the major and minor MMR proteins MLH1 and PMS2. The loss of expression may be secondary to promoter hyper-methylation, gene mutation or other genetic event. BRAF mutation testing and/or MLH1 methylation testing is indicated. The presence of a BRAF mutation and/or MLH1 hyper-methylation is indicative of a sporadic-type tumor. The absence of either BRAF mutation and/or presence of normal-methylation indicate the possible presence of a hereditary germline mutation (e.g. Lynch syndrome) and referral to genetic counseling is warranted. It is recommended that the loss of protein expression be correlated with molecular based MSI testing.  2. Mismatch Repair (MMR) Protein Immunohistochemistry (IHC) IHC Expression Result: MLH1: Preserved nuclear expression (greater 50% tumor expression) MSH2: Preserved nuclear expression (greater 50% tumor  expression) MSH6: Preserved nuclear expression (greater 50% tumor expression) PMS2: Preserved nuclear expression (greater 50% tumor expression) * Internal control demonstrates intact nuclear expression Interpretation: NORMAL       RADIOGRAPHIC STUDIES: I have personally reviewed the radiological images as listed and agreed with the findings in the report.  CT chest wo contrast 12/29/2014  IMPRESSION: No definite evidence of metastases. There are 2 nonenlarged but questionable right axillary lymph nodes.  CT abdomen and pelvis w contrast 12/29/2014  IMPRESSION: Circumferential mass lesion in the proximal right colon above the ileocecal valve consistent with carcinoma of the colon. There is stranding in the pericolonic fat which could be due to  tumor extension. No adenopathy.  Constipation without bowel obstruction.  These results will be called to the ordering clinician or representative by the Radiologist Assistant, and communication documented in the PACS or zVision Dashboard.   ASSESSMENT & PLAN: 75 year old African-American female with past medical history of hypertension, presented with fatigue, anemia and dizziness.  1. Right colon cancer, pT3N0M0 stage IIA, poorly differentiated,MSI-high, and sigmoid-rectal colon cancer, pT3N1cM0 stage IIIB, well differentiated,MSI-stable  -I reviewed her surgical pathology findings with patient and her husband in great details. She has 2 multifocal colon adenocarcinoma was distinct features. Her right colon cancer is stage IIA, poorly differentiated, MSI high, has P ref mutation and MLH-1 hypervascular mass lesion, which supports a sporadic colon cancer, unlikely Lynch syndrome. 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 -Her staging CT scan was negative for distant metastasis. Her pre-op CEA was elevated at 7.5, post op CEA came down to 1.7 (normal) -She has started adjuvant chemotherapy Xeloda, tolerating very well, no complaints.  -Lab results reviewed with her, she will start 3rd cycle on 3/15.   2. Hypertension -Her blood pressure is controlled -She'll continue follow-up with her primary care physician  3. Anemia, iron deficient from her colon cancer -She required blood transfusion a total of 3 units before her colon surgery -Repeated on study showed ferritin 33, mildly low serum iron and saturation, consistent with mild iron deficiency. -She is on oralferrous sulfate 2 times a day, tolerating well. We'll continue -Her anemia has resolved   4. Malnutrition and weight loss -much improved, she gained some weight back    Plan -Continue Xeloda, she will start cycle 3 on 3/15  -I will see her back in 3  weeks before cycle 4 with lab   All questions were answered. The patient knows to call the clinic with any problems, questions or concerns. I spent 15 minutes counseling the patient face to face. The total time spent in the appointment was 20 minutes and more than 50% was on counseling.     Truitt Merle, MD 05/14/2015

## 2015-05-16 ENCOUNTER — Telehealth: Payer: Self-pay | Admitting: *Deleted

## 2015-05-16 NOTE — Telephone Encounter (Signed)
Spoke with pt and informed pt re:  CMET results done 05/14/15 were normal as per Dr. Burr Medico.

## 2015-05-18 ENCOUNTER — Encounter: Payer: Self-pay | Admitting: Pharmacist

## 2015-05-18 NOTE — Progress Notes (Signed)
Oral Chemotherapy Follow-Up Form  Original Start date of oral chemotherapy: _2/1/17_   Called patient today to follow up regarding patient's oral chemotherapy medication: _Xeloda_  Pt is doing well today. Energy levels are better along with appetite. No side effects or issues with Xeloda. Today is Day 3 of cycle 3. No missed doses  Pt reports __0__ tablets/doses missed in the last month.    Pt reports the following side effects: _none____   Will follow up and call patient again in _3 weeks___   Thank you,  Montel Clock, PharmD, [facility]

## 2015-05-30 ENCOUNTER — Telehealth: Payer: Self-pay | Admitting: *Deleted

## 2015-05-30 NOTE — Telephone Encounter (Signed)
Oncology Nurse Navigator Documentation  Oncology Nurse Navigator Flowsheets 05/30/2015  Navigator Location [facility]  Navigator Encounter Type Telephone  Telephone Incoming Call;Symptom Mgt  Abnormal Finding Date -  Confirmed Diagnosis Date -  Surgery Date -  Patient Visit Type -  Treatment Phase Active Tx--Xeloda (cycle #3 completed 05/29/15)  Barriers/Navigation Needs Education  Education Pain/ Symptom Management--hands and feet are red, some peeling/cracking of skin, tender  Interventions Education Method  Coordination of Care -  Education Method Verbal;Teach-back  Support Groups/Services -  Acuity Level 1  Time Spent with Patient 15  She reports there is no skin breakdown and she is able to ambulate. Instructed her to wear loose fitting shoes, moisturize frequently with lotion (Eucerin or Udder Cream work well). Avoid extreme temperatures and wear gloves for any household chores that involve cleaners or water. Stop using the hand sanitizer and use mild soap to wash hands. May try cool compress on hands/feet few times day. Suggested lotion at night and wear socks and gloves to allow lotion to remain in contact longer. Be sure to alert MD to this at next visit prior to next cycle of chemo

## 2015-06-04 ENCOUNTER — Ambulatory Visit: Payer: Medicare Other | Admitting: Hematology

## 2015-06-04 ENCOUNTER — Other Ambulatory Visit: Payer: Self-pay | Admitting: General Surgery

## 2015-06-04 ENCOUNTER — Other Ambulatory Visit: Payer: Medicare Other

## 2015-06-05 ENCOUNTER — Ambulatory Visit (HOSPITAL_BASED_OUTPATIENT_CLINIC_OR_DEPARTMENT_OTHER): Payer: Medicare Other | Admitting: Hematology

## 2015-06-05 ENCOUNTER — Encounter: Payer: Self-pay | Admitting: Hematology

## 2015-06-05 ENCOUNTER — Telehealth: Payer: Self-pay | Admitting: Hematology

## 2015-06-05 ENCOUNTER — Other Ambulatory Visit (HOSPITAL_BASED_OUTPATIENT_CLINIC_OR_DEPARTMENT_OTHER): Payer: Medicare Other

## 2015-06-05 VITALS — BP 92/75 | HR 83 | Temp 97.9°F | Resp 16 | Ht 62.0 in | Wt 128.9 lb

## 2015-06-05 DIAGNOSIS — C187 Malignant neoplasm of sigmoid colon: Secondary | ICD-10-CM | POA: Diagnosis not present

## 2015-06-05 DIAGNOSIS — C182 Malignant neoplasm of ascending colon: Secondary | ICD-10-CM

## 2015-06-05 DIAGNOSIS — C772 Secondary and unspecified malignant neoplasm of intra-abdominal lymph nodes: Secondary | ICD-10-CM | POA: Diagnosis not present

## 2015-06-05 DIAGNOSIS — I10 Essential (primary) hypertension: Secondary | ICD-10-CM

## 2015-06-05 DIAGNOSIS — L271 Localized skin eruption due to drugs and medicaments taken internally: Secondary | ICD-10-CM

## 2015-06-05 DIAGNOSIS — D509 Iron deficiency anemia, unspecified: Secondary | ICD-10-CM

## 2015-06-05 LAB — CBC WITH DIFFERENTIAL/PLATELET
BASO%: 0.4 % (ref 0.0–2.0)
Basophils Absolute: 0 10*3/uL (ref 0.0–0.1)
EOS ABS: 0.3 10*3/uL (ref 0.0–0.5)
EOS%: 4 % (ref 0.0–7.0)
HCT: 37.4 % (ref 34.8–46.6)
HEMOGLOBIN: 11.9 g/dL (ref 11.6–15.9)
LYMPH#: 2.4 10*3/uL (ref 0.9–3.3)
LYMPH%: 28.6 % (ref 14.0–49.7)
MCH: 27.5 pg (ref 25.1–34.0)
MCHC: 31.8 g/dL (ref 31.5–36.0)
MCV: 86.4 fL (ref 79.5–101.0)
MONO#: 0.9 10*3/uL (ref 0.1–0.9)
MONO%: 11 % (ref 0.0–14.0)
NEUT%: 56 % (ref 38.4–76.8)
NEUTROS ABS: 4.8 10*3/uL (ref 1.5–6.5)
PLATELETS: 325 10*3/uL (ref 145–400)
RBC: 4.34 10*6/uL (ref 3.70–5.45)
RDW: 29.4 % — AB (ref 11.2–14.5)
WBC: 8.6 10*3/uL (ref 3.9–10.3)

## 2015-06-05 LAB — COMPREHENSIVE METABOLIC PANEL
ALBUMIN: 3.4 g/dL — AB (ref 3.5–5.0)
ALK PHOS: 61 U/L (ref 40–150)
ALT: <9 U/L (ref 0–55)
ANION GAP: 8 meq/L (ref 3–11)
AST: 18 U/L (ref 5–34)
BILIRUBIN TOTAL: 0.7 mg/dL (ref 0.20–1.20)
BUN: 11.4 mg/dL (ref 7.0–26.0)
CO2: 29 mEq/L (ref 22–29)
CREATININE: 0.8 mg/dL (ref 0.6–1.1)
Calcium: 9.5 mg/dL (ref 8.4–10.4)
Chloride: 106 mEq/L (ref 98–109)
EGFR: 80 mL/min/{1.73_m2} — AB (ref >90)
GLUCOSE: 91 mg/dL (ref 70–140)
Potassium: 3.8 mEq/L (ref 3.5–5.1)
Sodium: 142 mEq/L (ref 136–145)
TOTAL PROTEIN: 7.1 g/dL (ref 6.4–8.3)

## 2015-06-05 LAB — IRON AND TIBC
%SAT: 29 % (ref 21–57)
IRON: 107 ug/dL (ref 41–142)
TIBC: 368 ug/dL (ref 236–444)
UIBC: 261 ug/dL (ref 120–384)

## 2015-06-05 LAB — FERRITIN: Ferritin: 44 ng/ml (ref 9–269)

## 2015-06-05 MED ORDER — UREA 10 % EX CREA: TOPICAL_CREAM | CUTANEOUS | Status: DC | PRN

## 2015-06-05 MED ORDER — CAPECITABINE 500 MG PO TABS: 1000.0000 mg/m2 | ORAL_TABLET | Freq: Two times a day (BID) | ORAL | Status: DC

## 2015-06-05 MED FILL — CAPECITABINE 500 MG TABLET: 500 | 14 days supply | Qty: 84 | Fill #3

## 2015-06-05 NOTE — Telephone Encounter (Signed)
Gave patient avs report and appointments for April  °

## 2015-06-05 NOTE — Progress Notes (Signed)
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 Colonoscopy showed a ulcerated completely obstructing large mass in the rectosigmoid colon, 15 cm from anus. Diverticulosis in the rectosigmoid colon. Biopsy was taken from the mass.   12/29/2014 Imaging CT chest, abdomen and pelvis showed a circumferential mass lesion in the proximal right colon above the ileocecal valve consistent with carcinoma of the colon. No adenopathy. No distant metastasis.   02/15/2015 Initial Diagnosis Cancer of right colon ([place])   02/15/2015 Surgery Laparoscopic right colectomy and sigmoidectomy   02/15/2015 Pathology Results Right colon segmental resection showed a invasive poorly differentiated adenocarcinoma, spanning 8 cm, T3, margins were negative, 16 lymph nodes. No lymphovascular invasion, no perineural invasion. MMR showed loss of expression of ML H1 and PMS2, MSI-H   02/15/2015 Pathology Results Sigmoid colon segmental resection showed invasive well differentiated adenocarcinoma, 5.5 cm, T3, 10 lymph nodes were negative, (+) tumor deposit. 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 female is here because of her recently diagnosed colon cancer. She is accompanied by her husband to our multidisciplinary GI clinic today.  She presented with fatigue and dizzines in 10/2014, she also had decreased appetite, mild intermittent blood in stool, moderate constipation, and weight loss about 30lbs. She was seen by her primary care physician, was found to be anemic, and required blood transfusion in October 2016. She was referred to gastroenterologist Dr. Oletta Lamas, and underwent EGD and colonoscopy on 12/21/2014, which showed a ulcerated mass in the sigmoid-rectum, 15 cm from anal verge, in the scope was not able to advance. Multiple biopsy from the sigmoid colon mass showed invasive adenocarcinoma. CT of the chest abdomen and pelvis was obtained, which showed a circumferential mass lesion in the proximal right colon above the ileocecal valve. She was referred to colorectal surgeon Dr. Marcello Moores, and underwent left scopic right colectomy and sigmoidectomy on 02/15/2015. She was discharged home 5 days after her surgery.  She has revoered very well from her surgery. She has greast appetite, eats well, normal BM, takes miralax as needed, no pain gained 6-7 lbs weight back since the surgery. She has good energy level, and remains to be physically active at home.  She has no children, no family history of colon cancer or other malignancy except her father had brain tumor.  CURRENT THERAPY: Xeloda 1576m bid, 2  weeks on, 1 week off, started on 04/04/2015, plan for 8 cycles   INTERIM HISTORY:  Rossi returns for follow-up. She  Xeloda well, her only complaint is her skin documents on the palm and bottom of her feet, she also has mild skin peeling on her  palm. No cracks or ulceration. She has been using Aquaphor  Lotion daily. She has slightly more sensitivity of her palm, no tingling or pain. Her hands functions normal. No other  Complaints. She has good appetite and eating well,  She has gained a few pounds lately. She has good energy level, functions very well at home.  MEDICAL HISTORY:  Past Medical History  Diagnosis Date  . Hypertension   . Falls     pt states fell twice this week   . History of blood transfusion     12/2014  . Anemia     SURGICAL HISTORY: Past Surgical History  Procedure Laterality Date  . Dilation and curettage of uterus    . Colonscopy     . Upper gi endoscopy    . Laparoscopic partial colectomy N/A 02/15/2015    Procedure: LAPAROSCOPIC RIGHT COLECTOMY AND SIGMOIDECTOMY PROCTOSCOPY;  Surgeon: Leighton Ruff, MD;  Location: [facility];  Service: General;  Laterality: N/A;  . Upper gi endoscopy  02/15/2015    Procedure: UPPER GI ENDOSCOPY;  Surgeon: Leighton Ruff, MD;  Location: [facility];  Service: General;;    SOCIAL HISTORY: Social History   Social History  . Marital Status: Married    Spouse Name: N/A  . Number of Children: N/A  . Years of Education: N/A   Occupational History  . Not on file.   Social History Main Topics  . Smoking status: Former Smoker -- 0.50 packs/day for 20 years    Types: Cigarettes    Quit date: 03/04/1995  . Smokeless tobacco: Never Used  . Alcohol Use: No  . Drug Use: No  . Sexual Activity: Not on file   Other Topics Concern  . Not on file   Social History Narrative   Married, husband [name]   No children   Independent ADLs, drives    FAMILY HISTORY: Family History  Problem Relation Age of Onset  . Cancer Father 33    brain tumor     ALLERGIES:  has No Known Allergies.  MEDICATIONS:  Current Outpatient Prescriptions  Medication Sig Dispense Refill  . acetaminophen (TYLENOL) 325 MG tablet Take 2 tablets (650 mg total) by mouth every 6 (six) hours as needed.     . capecitabine (XELODA) 500 MG tablet Take 3 tablets (1,500 mg total) by mouth 2 (two) times daily after a meal. 84 tablet 3  . ferrous sulfate 325 (65 FE) MG tablet Take 325 mg by mouth 2 (two) times daily.  3  . polyethylene glycol (MIRALAX / GLYCOLAX) packet Take 17 g by mouth daily as needed for mild constipation, moderate constipation or severe constipation.    . triamterene-hydrochlorothiazide (MAXZIDE-25) 37.5-25 MG tablet Take 0.5 tablets by mouth daily.  3  . urea (CARMOL) 10 % cream Apply topically as needed. 71 g 1   No current facility-administered medications for this visit.    REVIEW OF SYSTEMS:   Constitutional: Denies fevers, chills or abnormal night sweats Eyes: Denies blurriness of vision, double vision or watery eyes Ears, nose, mouth, throat, and face: Denies mucositis or sore throat Respiratory: Denies cough, dyspnea or wheezes Cardiovascular: Denies palpitation, chest discomfort or lower extremity swelling Gastrointestinal:  Denies nausea, heartburn  or change in bowel habits Skin: Denies abnormal skin rashes Lymphatics: Denies new lymphadenopathy or easy bruising Neurological:Denies numbness, tingling or new weaknesses Behavioral/Psych: Mood is stable, no new changes  All other systems were reviewed with the patient and are negative.  PHYSICAL EXAMINATION: ECOG PERFORMANCE STATUS: 0  Filed Vitals:   06/05/15 1425  BP: 92/75  Pulse: 83  Temp: 97.9 F (36.6 C)  Resp: 16   Filed Weights   06/05/15 1425  Weight: 128 lb 14.4 oz (58.469 kg)    GENERAL:alert, no distress and comfortable SKIN: skin color, texture, turgor are normal, no rashes or significant lesions, (+)  Skin pigmentations on post hands, especially on the palm, with small area of skin peeling,  No cracks or ulceration EYES: normal, conjunctiva are pink and non-injected, sclera clear OROPHARYNX:no exudate, no erythema and lips, buccal mucosa, and tongue normal  NECK: supple, thyroid normal  size, non-tender, without nodularity LYMPH:  no palpable lymphadenopathy in the cervical, axillary or inguinal LUNGS: clear to auscultation and percussion with normal breathing effort HEART: regular rate & rhythm and no murmurs and no lower extremity edema ABDOMEN:abdomen soft, non-tender and normal bowel sounds. Midline surgical scar and laparoscopic surgical scars are all well healed, no skin erythema or discharge. Musculoskeletal:no cyanosis of digits and no clubbing  PSYCH: alert & oriented x 3 with fluent speech NEURO: no focal motor/sensory deficits  LABORATORY DATA:  I have reviewed the data as listed CBC Latest Ref Rng 06/05/2015 05/14/2015 04/23/2015  WBC 3.9 - 10.3 10e3/uL 8.6 8.1 7.9  Hemoglobin 11.6 - 15.9 g/dL 11.9 12.5 11.9  Hematocrit 34.8 - 46.6 % 37.4 39.5 37.7  Platelets 145 - 400 10e3/uL 325 311 351     Recent Labs  02/16/15 0433 02/17/15 0513 02/18/15 0543  04/23/15 1236 05/14/15 1310 06/05/15 1309  NA 136 136 137  < > 138 139 142  K 3.2* 3.6 3.9  < > 4.0 3.9 3.8  CL 101 103 103  --   --   --   --   CO2 _0 < > _1 GLUCOSE 99 93 95  < > 118 107 91  BUN 9 7 <5*  < > 13.1 14.1 11.4  CREATININE 0.82 0.65 0.64  < > 0.8 0.9 0.8  CALCIUM 8.5* 8.2* 8.3*  < > 9.2 9.4 9.5  GFRNONAA >60 >60 >60  --   --   --   --   GFRAA >60 >60 >60  --   --   --   --   PROT  --   --   --   < > 7.1 7.4 7.1  ALBUMIN  --   --   --   < > 3.3* 3.5 3.4*  AST  --   --   --   < > _2 ALT  --   --   --   < > 10 13 <9  ALKPHOS  --   --   --   < > 61 68 61  BILITOT  --   --   --   < > <0.30 0.76 0.70  < > = values in this interval not displayed.  PATHOLOGY REPORT  Diagnosis 02/15/2015 1. Colon, segmental resection for tumor, right - INVASIVE POORLY DIFFERENTIATED ADENOCARCINOMA, SPANNING 8 CM IN GREATEST DIMENSION. - TUMOR INVADES THROUGH MUSCULARIS PROPRIA TO INVOLVE SUBSEROSAL SOFT TISSUES. - MARGIN IS NEGATIVE. - SIXTEEN BENIGN LYMPH NODES WITH NO TUMOR SEEN  (  0/16). - SEE ONCOLOGY TEMPLATE. 2. Colon, segmental resection for tumor, sigmoid - INVASIVE WELL DIFFERENTIATED ADENOCARCINOMA WITH ABUNDANT EXTRACELLULAR MUCIN, SPANNING 5.5 CM IN GREATEST DIMENSION. - TUMOR INVADES THROUGH MUSCULARIS PROPRIA TO INVOLVE SUBSEROSAL SOFT TISSUES. - MARGINS ARE NEGATIVE. - SATELLITE TUMOR DEPOSIT (DISCONTINUOUS EXTRAMURAL EXTENSION) IS PRESENT. - TEN BENIGN LYMPH NODES WITH NO TUMOR SEEN (0/10). - SEE ONCOLOGY TEMPLATE BELOW. 3. Colon, resection margin (donut), distal anastomotic ring - BENIGN COLORECTAL MUCOSA. - NO TUMOR SEEN.  Microscopic Comment 1. COLON AND RECTUM: Specimen: Right colon. Procedure: Right hemicolectomy. Tumor site: Ascending colon. Specimen integrity: Intact. Macroscopic tumor perforation: Not identified. Invasive tumor: Maximum size: 8 cm. Histologic type(s): Adenocarcinoma. Histologic grade and differentiation: G3: poorly differentiated/high grade. Type of polyp in which invasive carcinoma arose: A definitive precursor polyp is not identified. Microscopic extension of invasive tumor: Tumor invades through muscularis propria to involve subserosal soft tissues. Lymph-Vascular invasion: Definitive lymph/vascular invasion is not identified. Peri-neural invasion: Not identified. Tumor deposit(s) (discontinuous extramural extension): No tumor deposits showing a high grade poorly differentiated adenocarcinoma are identified. Resection margins: Proximal margin: Negative. Distal margin: Negative. Circumferential (radial) (posterior ascending, posterior descending; lateral and posterior mid-rectum; and entire lower 1/3 rectum): Negative. Distance closest margin (if all above margins negative): 4.5 cm (radial margin). Treatment effect (neo-adjuvant therapy): Not applicable. Additional polyp(s): Three additional tubular adenomas are present in the right colon specimen. Non-neoplastic findings: Benign unremarkable appendix. Lymph  nodes: number examined: 16 (from the right colon specimen); number positive: 0. Pathologic Staging: pT3, pN0. Ancillary studies: The tumor will be sent for MMR by [place] and MSI by PCR. 2. COLON AND RECTUM: Specimen: Right partial colon (sigmoid colon). Procedure: Right partial colectomy (sigmoid resection). Tumor site: Sigmoid colon (distal aspect of the specimen along the posterior aspect). Specimen integrity: Intact. Macroscopic tumor perforation: Not identified. Invasive tumor: Maximum size: 5.5 cm. Histologic type(s): Adenocarcinoma with abundant extracellular mucin. Histologic grade and differentiation: G1: well differentiated/low grade. Type of polyp in which invasive carcinoma arose: Tumor arose from a tubular adenoma with high grade glandular dysplasia. Microscopic extension of invasive tumor: Tumor invades through muscularis propria to involve subserosal soft tissue. Lymph-Vascular invasion: Definitive lymph/vascular invasion is not identified, however there is a tumor deposit present, see below. Peri-neural invasion: Not identified. Tumor deposit(s) (discontinuous extramural extension): Yes, tumor deposit is present. Resection margins: Proximal margin: Negative. Distal margin: Negative. Circumferential (radial) (posterior ascending, posterior descending; lateral and posterior mid-rectum; and entire lower 1/3 rectum): 3.1 cm. Distance closest margin (if all above margins negative): 2.4 cm (distal margin). Treatment effect (neo-adjuvant therapy): Not applicable. Additional polyp(s): Benign polypoid colorectal mucosa with hyperplastic-type change (possibly representing early hyperplastic polyp formation) is present. Non-neoplastic findings: No additional non-neoplastic findings Lymph nodes: number examined: 10 (from the sigmoid colon specimen); number positive: 0, see comment. Pathologic Staging: pT3, pN1c. Ancillary studies: The tumor will be sent for MMR by IHC and MSI by PCR  per colorectal cancer protocol. Comment: Initially, only 7 lymph nodes are identified in the sigmoid colon specimen. The specimen is placed in fat clearing solution with an additional three lymph nodes identified on second gross assessment. (RH:kh 02-19-15)  Mismatch Repair (MMR) Protein Immunohistochemistry (IHC) IHC Expression Result: MLH1: LOSS OF NUCLEAR EXPRESSION (LESS THAN 5% TUMOR EXPRESSION) MSH2: Preserved nuclear expression (greater 50% tumor expression) MSH6: Preserved nuclear expression (greater 50% tumor expression) PMS2: LOSS OF NUCLEAR EXPRESSION (LESS THAN 5% TUMOR EXPRESSION) * Internal control demonstrates intact nuclear expression Interpretation: ABNORMAL There is loss of the major and  minor MMR proteins MLH1 and PMS2. The loss of expression may be secondary to promoter hyper-methylation, gene mutation or other genetic event. BRAF mutation testing and/or MLH1 methylation testing is indicated. The presence of a BRAF mutation and/or MLH1 hyper-methylation is indicative of a sporadic-type tumor. The absence of either BRAF mutation and/or presence of normal-methylation indicate the possible presence of a hereditary germline mutation (e.g. Lynch syndrome) and referral to genetic counseling is warranted. It is recommended that the loss of protein expression be correlated with molecular based MSI testing.  2. Mismatch Repair (MMR) Protein Immunohistochemistry (IHC) IHC Expression Result: MLH1: Preserved nuclear expression (greater 50% tumor expression) MSH2: Preserved nuclear expression (greater 50% tumor expression) MSH6: Preserved nuclear expression (greater 50% tumor expression) PMS2: Preserved nuclear expression (greater 50% tumor expression) * Internal control demonstrates intact nuclear expression Interpretation: NORMAL       RADIOGRAPHIC STUDIES: I have personally reviewed the radiological images as listed and agreed with the findings in the report.  CT chest  wo contrast 12/29/2014  IMPRESSION: No definite evidence of metastases. There are 2 nonenlarged but questionable right axillary lymph nodes.  CT abdomen and pelvis w contrast 12/29/2014  IMPRESSION: Circumferential mass lesion in the proximal right colon above the ileocecal valve consistent with carcinoma of the colon. There is stranding in the pericolonic fat which could be due to tumor extension. No adenopathy.  Constipation without bowel obstruction.  These results will be called to the ordering clinician or representative by the Radiologist Assistant, and communication documented in the PACS or zVision Dashboard.   ASSESSMENT & PLAN: 75 year old African-American female with past medical history of hypertension, presented with fatigue, anemia and dizziness.  1. Right colon cancer, pT3N0M0 stage IIA, poorly differentiated,MSI-high, and sigmoid-rectal colon cancer, pT3N1cM0 stage IIIB, well differentiated,MSI-stable  -I reviewed her surgical pathology findings with patient and her husband in great details. She has 2 multifocal colon adenocarcinoma was distinct features. Her right colon cancer is stage IIA, poorly differentiated, MSI high, has P ref mutation and MLH-1 hypervascular mass lesion, which supports a sporadic colon cancer, unlikely Lynch syndrome. 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 -Her staging CT scan was negative for distant metastasis. Her pre-op CEA was elevated at 7.5, post op CEA came down to 1.7 (normal) -She has started adjuvant chemotherapy Xeloda, tolerating very well, no complaints except skin issue on her palms  -Lab results reviewed with her, CBC and CMP are unremarkable, she will start 4rd cycle on tomorrow.   2. Hypertension - her blood pressure slightly low today, she is not symptomatically, I told her to hold maxzide if SBP<100,  She'll check her blood pressure at home -She'll  continue follow-up with her primary care physician  3. Anemia, iron deficient from her colon cancer -She required blood transfusion a total of 3 units before her colon surgery -Repeated on study showed ferritin 33, mildly low serum iron and saturation, consistent with mild iron deficiency. -She is on oralferrous sulfate 2 times a day, tolerating well. We'll continue -Her anemia has resolved,  We'll continue monitoring when she is on chemotherapy  4. Malnutrition and weight loss -much improved, she gained some weight back   5. Hand and foot syndrome  - secondary to Xeloda - I called in urea cream 10%,  She'll use twice a day - she knows to use moisturizer frequently   Plan - hold Maxzide for a few days, until SBP>100 -Continue Xeloda, she will start cycle  4 on 4/5  -I will see her back in 3 weeks before cycle 5 with lab   All questions were answered. The patient knows to call the clinic with any problems, questions or concerns. I spent 20 minutes counseling the patient face to face. The total time spent in the appointment was 25 minutes and more than 50% was on counseling.     Truitt Merle, MD 06/05/2015

## 2015-06-06 LAB — CEA: CEA1: 4.7 ng/mL (ref 0.0–4.7)

## 2015-06-26 ENCOUNTER — Other Ambulatory Visit: Payer: Self-pay | Admitting: Hematology

## 2015-06-26 ENCOUNTER — Telehealth: Payer: Self-pay

## 2015-06-26 NOTE — Telephone Encounter (Signed)
Patient states that she is due to go back on the capecitabine (XELODA) 500 MG tablet tomorrow.  She is asking whether she could wait and extra day and not start back on the medication until Thursday because she doesn't want to make two trips to [facility]- two days in a row. Patient requesting a call back with an answer today.

## 2015-06-27 NOTE — Telephone Encounter (Signed)
Pt was informed of this & will wait to order xeloda after pt comes in to see MD.

## 2015-06-27 NOTE — Telephone Encounter (Signed)
Myrtle,   Please call her back. It's OK to pick up Xeloda tomorrow after her visit here tomorrow.  Truitt Merle  06/27/2015

## 2015-06-28 ENCOUNTER — Other Ambulatory Visit: Payer: Self-pay | Admitting: *Deleted

## 2015-06-28 ENCOUNTER — Encounter: Payer: Self-pay | Admitting: Hematology

## 2015-06-28 ENCOUNTER — Telehealth: Payer: Self-pay | Admitting: Hematology

## 2015-06-28 ENCOUNTER — Other Ambulatory Visit (HOSPITAL_BASED_OUTPATIENT_CLINIC_OR_DEPARTMENT_OTHER): Payer: Medicare Other

## 2015-06-28 ENCOUNTER — Ambulatory Visit (HOSPITAL_BASED_OUTPATIENT_CLINIC_OR_DEPARTMENT_OTHER): Payer: Medicare Other | Admitting: Hematology

## 2015-06-28 VITALS — BP 98/67 | HR 84 | Temp 97.8°F | Resp 18 | Ht 62.0 in | Wt 135.4 lb

## 2015-06-28 DIAGNOSIS — C772 Secondary and unspecified malignant neoplasm of intra-abdominal lymph nodes: Secondary | ICD-10-CM | POA: Diagnosis not present

## 2015-06-28 DIAGNOSIS — C187 Malignant neoplasm of sigmoid colon: Secondary | ICD-10-CM

## 2015-06-28 DIAGNOSIS — R634 Abnormal weight loss: Secondary | ICD-10-CM

## 2015-06-28 DIAGNOSIS — D509 Iron deficiency anemia, unspecified: Secondary | ICD-10-CM | POA: Diagnosis not present

## 2015-06-28 DIAGNOSIS — L271 Localized skin eruption due to drugs and medicaments taken internally: Secondary | ICD-10-CM

## 2015-06-28 DIAGNOSIS — E46 Unspecified protein-calorie malnutrition: Secondary | ICD-10-CM

## 2015-06-28 DIAGNOSIS — C182 Malignant neoplasm of ascending colon: Secondary | ICD-10-CM

## 2015-06-28 LAB — COMPREHENSIVE METABOLIC PANEL
ALT: 17 U/L (ref 0–55)
AST: 23 U/L (ref 5–34)
Albumin: 3.5 g/dL (ref 3.5–5.0)
Alkaline Phosphatase: 48 U/L (ref 40–150)
Anion Gap: 8 mEq/L (ref 3–11)
BUN: 11.1 mg/dL (ref 7.0–26.0)
CO2: 25 meq/L (ref 22–29)
Calcium: 9.3 mg/dL (ref 8.4–10.4)
Chloride: 109 mEq/L (ref 98–109)
Creatinine: 0.8 mg/dL (ref 0.6–1.1)
EGFR: 84 mL/min/{1.73_m2} — AB (ref >90)
GLUCOSE: 98 mg/dL (ref 70–140)
POTASSIUM: 3.5 meq/L (ref 3.5–5.1)
SODIUM: 142 meq/L (ref 136–145)
TOTAL PROTEIN: 7 g/dL (ref 6.4–8.3)
Total Bilirubin: 0.6 mg/dL (ref 0.20–1.20)

## 2015-06-28 LAB — CBC WITH DIFFERENTIAL/PLATELET
BASO%: 0.4 % (ref 0.0–2.0)
Basophils Absolute: 0 10*3/uL (ref 0.0–0.1)
EOS ABS: 0.5 10*3/uL (ref 0.0–0.5)
EOS%: 6.5 % (ref 0.0–7.0)
HCT: 38.4 % (ref 34.8–46.6)
HGB: 12.2 g/dL (ref 11.6–15.9)
LYMPH%: 24.6 % (ref 14.0–49.7)
MCH: 29.3 pg (ref 25.1–34.0)
MCHC: 31.8 g/dL (ref 31.5–36.0)
MCV: 92 fL (ref 79.5–101.0)
MONO#: 0.9 10*3/uL (ref 0.1–0.9)
MONO%: 11.9 % (ref 0.0–14.0)
NEUT%: 56.6 % (ref 38.4–76.8)
NEUTROS ABS: 4.1 10*3/uL (ref 1.5–6.5)
Platelets: 287 10*3/uL (ref 145–400)
RBC: 4.17 10*6/uL (ref 3.70–5.45)
RDW: 27.2 % — ABNORMAL HIGH (ref 11.2–14.5)
WBC: 7.2 10*3/uL (ref 3.9–10.3)
lymph#: 1.8 10*3/uL (ref 0.9–3.3)

## 2015-06-28 MED ORDER — CAPECITABINE 500 MG PO TABS: 1500.0000 mg | ORAL_TABLET | Freq: Two times a day (BID) | ORAL | Status: DC

## 2015-06-28 MED FILL — CAPECITABINE 500 MG TABLET: 500 | 14 days supply | Qty: 84 | Fill #0

## 2015-06-28 NOTE — Progress Notes (Signed)
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 Colonoscopy showed a ulcerated completely obstructing large mass in the rectosigmoid colon, 15 cm from anus. Diverticulosis in the rectosigmoid colon. Biopsy was taken from the mass.   12/29/2014 Imaging CT chest, abdomen and pelvis showed a circumferential mass lesion in the proximal right colon above the ileocecal valve consistent with carcinoma of the colon. No adenopathy. No distant metastasis.   02/15/2015 Initial Diagnosis Cancer of right colon (HCC)   02/15/2015 Surgery Laparoscopic right colectomy and sigmoidectomy   02/15/2015 Pathology Results Right colon segmental resection showed a invasive poorly differentiated adenocarcinoma, spanning 8 cm, T3, margins were negative, 16 lymph nodes. No lymphovascular invasion, no perineural invasion. MMR showed loss of expression of ML H1 and PMS2, MSI-H   02/15/2015 Pathology Results Sigmoid colon segmental resection showed invasive well differentiated adenocarcinoma, 5.5 cm, T3, 10 lymph nodes were negative, (+) tumor deposit. 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 female is here because of her recently diagnosed colon cancer. She is accompanied by her husband to our multidisciplinary GI clinic today.  She presented with fatigue and dizzines in 10/2014, she also had decreased appetite, mild intermittent blood in stool, moderate constipation, and weight loss about 30lbs. She was seen by her primary care physician, was found to be anemic, and required blood transfusion in October 2016. She was referred to gastroenterologist Dr. Edwards, and underwent EGD and colonoscopy on 12/21/2014, which showed a ulcerated mass in the sigmoid-rectum, 15 cm from anal verge, in the scope was not able to advance. Multiple biopsy from the sigmoid colon mass showed invasive adenocarcinoma. CT of the chest abdomen and pelvis was obtained, which showed a circumferential mass lesion in the proximal right colon above the ileocecal valve. She was referred to colorectal surgeon Dr. Thomas, and underwent left scopic right colectomy and sigmoidectomy on 02/15/2015. She was discharged home 5 days after her surgery.  She has revoered very well from her surgery. She has greast appetite, eats well, normal BM, takes miralax as needed, no pain gained 6-7 lbs weight back since the surgery. She has good energy level, and remains to be physically active at home.  She has no children, no family history of colon cancer or other malignancy except her father had brain tumor.  CURRENT THERAPY: Xeloda 1500mg bid, 2   weeks on, 1 week off, started on 04/04/2015, plan for 8 cycles   INTERIM HISTORY:  Tysheka returns for follow-up. She is doing well overall, has been off diuretics, Her skin dryness and peeling has improved with urea cream. She otherwise is doing  well, denies any new symptoms. She has good appetite and eating well, no diarrhea.  MEDICAL HISTORY:  Past Medical History  Diagnosis Date  . Hypertension   . Falls     pt states fell twice this week   . History of blood transfusion     12/2014  . Anemia     SURGICAL HISTORY: Past Surgical History  Procedure Laterality Date  . Dilation and curettage of uterus    . Colonscopy     . Upper gi endoscopy    . Laparoscopic partial colectomy N/A 02/15/2015    Procedure: LAPAROSCOPIC RIGHT COLECTOMY AND SIGMOIDECTOMY PROCTOSCOPY;  Surgeon: Alicia Thomas, MD;  Location: [facility];  Service: General;  Laterality: N/A;  . Upper gi endoscopy  02/15/2015    Procedure: UPPER GI ENDOSCOPY;  Surgeon: Alicia Thomas, MD;  Location: [facility];  Service: General;;    SOCIAL HISTORY: Social History   Social History  . Marital Status: Married    Spouse Name: N/A  . Number of Children: N/A  . Years of Education: N/A   Occupational History  . Not on file.   Social History Main Topics  . Smoking status: Former Smoker -- 0.50 packs/day for 20 years    Types: Cigarettes    Quit date: 03/04/1995  . Smokeless tobacco: Never Used  . Alcohol Use: No  . Drug Use: No  . Sexual Activity: Not on file   Other Topics Concern  . Not on file   Social History Narrative   Married, husband Albert   No children   Independent ADLs, drives    FAMILY HISTORY: Family History  Problem Relation Age of Onset  . Cancer Father 55    brain tumor     ALLERGIES:  has No Known Allergies.  MEDICATIONS:  Current Outpatient Prescriptions  Medication Sig Dispense Refill  . acetaminophen (TYLENOL) 325 MG tablet Take 2 tablets (650 mg total) by mouth every 6 (six) hours as needed.    . capecitabine (XELODA) 500 MG tablet Take 3 tablets (1,500 mg total) by mouth 2 (two) times daily after a meal. (Patient taking differently: Take 1,500 mg by mouth 2 (two) times daily after a meal. Takes for 14 days,  Off  7 days.) 84  tablet 3  . ferrous sulfate 325 (65 FE) MG tablet Take 325 mg by mouth 2 (two) times daily.  3  . polyethylene glycol (MIRALAX / GLYCOLAX) packet Take 17 g by mouth daily as needed for mild constipation, moderate constipation or severe constipation.    . urea (CARMOL) 10 % cream Apply topically as needed. 71 g 1   No current facility-administered medications for this visit.    REVIEW OF SYSTEMS:   Constitutional: Denies fevers, chills or abnormal night sweats Eyes: Denies blurriness of vision, double vision or watery eyes Ears, nose, mouth, throat, and face: Denies mucositis or sore throat Respiratory: Denies cough, dyspnea or wheezes Cardiovascular: Denies palpitation, chest discomfort or lower extremity swelling Gastrointestinal:  Denies nausea, heartburn or change in bowel habits Skin: Denies abnormal skin rashes Lymphatics: Denies new lymphadenopathy or easy bruising Neurological:Denies numbness, tingling or new weaknesses Behavioral/Psych: Mood is stable, no new changes  All other systems were reviewed with the patient   and are negative.  PHYSICAL EXAMINATION: ECOG PERFORMANCE STATUS: 0  Filed Vitals:   06/28/15 1014  BP: 98/67  Pulse: 84  Temp: 97.8 F (36.6 C)  Resp: 18   Filed Weights   06/28/15 1014  Weight: 135 lb 6.4 oz (61.417 kg)    GENERAL:alert, no distress and comfortable SKIN: skin color, texture, turgor are normal, no rashes or significant lesions, (+)  Skin pigmentations on post hands, especially on the palm, with small area of skin peeling,  No cracks or ulceration EYES: normal, conjunctiva are pink and non-injected, sclera clear OROPHARYNX:no exudate, no erythema and lips, buccal mucosa, and tongue normal  NECK: supple, thyroid normal size, non-tender, without nodularity LYMPH:  no palpable lymphadenopathy in the cervical, axillary or inguinal LUNGS: clear to auscultation and percussion with normal breathing effort HEART: regular rate & rhythm and no  murmurs and no lower extremity edema ABDOMEN:abdomen soft, non-tender and normal bowel sounds. Midline surgical scar and laparoscopic surgical scars are all well healed, no skin erythema or discharge. Musculoskeletal:no cyanosis of digits and no clubbing  PSYCH: alert & oriented x 3 with fluent speech NEURO: no focal motor/sensory deficits  LABORATORY DATA:  I have reviewed the data as listed CBC Latest Ref Rng 06/28/2015 06/05/2015 05/14/2015  WBC 3.9 - 10.3 10e3/uL 7.2 8.6 8.1  Hemoglobin 11.6 - 15.9 g/dL 12.2 11.9 12.5  Hematocrit 34.8 - 46.6 % 38.4 37.4 39.5  Platelets 145 - 400 10e3/uL 287 325 311     Recent Labs  02/16/15 0433 02/17/15 0513 02/18/15 0543  05/14/15 1310 06/05/15 1309 06/28/15 0936  NA 136 136 137  < > 139 142 142  K 3.2* 3.6 3.9  < > 3.9 3.8 3.5  CL 101 103 103  --   --   --   --   CO2 24 28 28  < > 28 29 25  GLUCOSE 99 93 95  < > 107 91 98  BUN 9 7 <5*  < > 14.1 11.4 11.1  CREATININE 0.82 0.65 0.64  < > 0.9 0.8 0.8  CALCIUM 8.5* 8.2* 8.3*  < > 9.4 9.5 9.3  GFRNONAA >60 >60 >60  --   --   --   --   GFRAA >60 >60 >60  --   --   --   --   PROT  --   --   --   < > 7.4 7.1 7.0  ALBUMIN  --   --   --   < > 3.5 3.4* 3.5  AST  --   --   --   < > 22 18 23  ALT  --   --   --   < > 13 <9 17  ALKPHOS  --   --   --   < > 68 61 48  BILITOT  --   --   --   < > 0.76 0.70 0.60  < > = values in this interval not displayed.  PATHOLOGY REPORT  Diagnosis 02/15/2015 1. Colon, segmental resection for tumor, right - INVASIVE POORLY DIFFERENTIATED ADENOCARCINOMA, SPANNING 8 CM IN GREATEST DIMENSION. - TUMOR INVADES THROUGH MUSCULARIS PROPRIA TO INVOLVE SUBSEROSAL SOFT TISSUES. - MARGIN IS NEGATIVE. - SIXTEEN BENIGN LYMPH NODES WITH NO TUMOR SEEN (0/16). - SEE ONCOLOGY TEMPLATE. 2. Colon, segmental resection for tumor, sigmoid - INVASIVE WELL DIFFERENTIATED ADENOCARCINOMA WITH ABUNDANT EXTRACELLULAR MUCIN, SPANNING 5.5 CM IN GREATEST DIMENSION. - TUMOR INVADES THROUGH  MUSCULARIS PROPRIA TO INVOLVE SUBSEROSAL SOFT TISSUES. -   MARGINS ARE NEGATIVE. - SATELLITE TUMOR DEPOSIT (DISCONTINUOUS EXTRAMURAL EXTENSION) IS PRESENT. - TEN BENIGN LYMPH NODES WITH NO TUMOR SEEN (0/10). - SEE ONCOLOGY TEMPLATE BELOW. 3. Colon, resection margin (donut), distal anastomotic ring - BENIGN COLORECTAL MUCOSA. - NO TUMOR SEEN.  Microscopic Comment 1. COLON AND RECTUM: Specimen: Right colon. Procedure: Right hemicolectomy. Tumor site: Ascending colon. Specimen integrity: Intact. Macroscopic tumor perforation: Not identified. Invasive tumor: Maximum size: 8 cm. Histologic type(s): Adenocarcinoma. Histologic grade and differentiation: G3: poorly differentiated/high grade. Type of polyp in which invasive carcinoma arose: A definitive precursor polyp is not identified. Microscopic extension of invasive tumor: Tumor invades through muscularis propria to involve subserosal soft tissues. Lymph-Vascular invasion: Definitive lymph/vascular invasion is not identified. Peri-neural invasion: Not identified. Tumor deposit(s) (discontinuous extramural extension): No tumor deposits showing a high grade poorly differentiated adenocarcinoma are identified. Resection margins: Proximal margin: Negative. Distal margin: Negative. Circumferential (radial) (posterior ascending, posterior descending; lateral and posterior mid-rectum; and entire lower 1/3 rectum): Negative. Distance closest margin (if all above margins negative): 4.5 cm (radial margin). Treatment effect (neo-adjuvant therapy): Not applicable. Additional polyp(s): Three additional tubular adenomas are present in the right colon specimen. Non-neoplastic findings: Benign unremarkable appendix. Lymph nodes: number examined: 16 (from the right colon specimen); number positive: 0. Pathologic Staging: pT3, pN0. Ancillary studies: The tumor will be sent for MMR by ICH and MSI by PCR. 2. COLON AND RECTUM: Specimen: Right partial  colon (sigmoid colon). Procedure: Right partial colectomy (sigmoid resection). Tumor site: Sigmoid colon (distal aspect of the specimen along the posterior aspect). Specimen integrity: Intact. Macroscopic tumor perforation: Not identified. Invasive tumor: Maximum size: 5.5 cm. Histologic type(s): Adenocarcinoma with abundant extracellular mucin. Histologic grade and differentiation: G1: well differentiated/low grade. Type of polyp in which invasive carcinoma arose: Tumor arose from a tubular adenoma with high grade glandular dysplasia. Microscopic extension of invasive tumor: Tumor invades through muscularis propria to involve subserosal soft tissue. Lymph-Vascular invasion: Definitive lymph/vascular invasion is not identified, however there is a tumor deposit present, see below. Peri-neural invasion: Not identified. Tumor deposit(s) (discontinuous extramural extension): Yes, tumor deposit is present. Resection margins: Proximal margin: Negative. Distal margin: Negative. Circumferential (radial) (posterior ascending, posterior descending; lateral and posterior mid-rectum; and entire lower 1/3 rectum): 3.1 cm. Distance closest margin (if all above margins negative): 2.4 cm (distal margin). Treatment effect (neo-adjuvant therapy): Not applicable. Additional polyp(s): Benign polypoid colorectal mucosa with hyperplastic-type change (possibly representing early hyperplastic polyp formation) is present. Non-neoplastic findings: No additional non-neoplastic findings Lymph nodes: number examined: 10 (from the sigmoid colon specimen); number positive: 0, see comment. Pathologic Staging: pT3, pN1c. Ancillary studies: The tumor will be sent for MMR by IHC and MSI by PCR per colorectal cancer protocol. Comment: Initially, only 7 lymph nodes are identified in the sigmoid colon specimen. The specimen is placed in fat clearing solution with an additional three lymph nodes identified on second gross  assessment. (RH:kh 02-19-15)  Mismatch Repair (MMR) Protein Immunohistochemistry (IHC) IHC Expression Result: MLH1: LOSS OF NUCLEAR EXPRESSION (LESS THAN 5% TUMOR EXPRESSION) MSH2: Preserved nuclear expression (greater 50% tumor expression) MSH6: Preserved nuclear expression (greater 50% tumor expression) PMS2: LOSS OF NUCLEAR EXPRESSION (LESS THAN 5% TUMOR EXPRESSION) * Internal control demonstrates intact nuclear expression Interpretation: ABNORMAL There is loss of the major and minor MMR proteins MLH1 and PMS2. The loss of expression may be secondary to promoter hyper-methylation, gene mutation or other genetic event. BRAF mutation testing and/or MLH1 methylation testing is indicated. The presence of a BRAF mutation and/or MLH1   hyper-methylation is indicative of a sporadic-type tumor. The absence of either BRAF mutation and/or presence of normal-methylation indicate the possible presence of a hereditary germline mutation (e.g. Lynch syndrome) and referral to genetic counseling is warranted. It is recommended that the loss of protein expression be correlated with molecular based MSI testing.  2. Mismatch Repair (MMR) Protein Immunohistochemistry (IHC) IHC Expression Result: MLH1: Preserved nuclear expression (greater 50% tumor expression) MSH2: Preserved nuclear expression (greater 50% tumor expression) MSH6: Preserved nuclear expression (greater 50% tumor expression) PMS2: Preserved nuclear expression (greater 50% tumor expression) * Internal control demonstrates intact nuclear expression Interpretation: NORMAL       RADIOGRAPHIC STUDIES: I have personally reviewed the radiological images as listed and agreed with the findings in the report.  CT chest wo contrast 12/29/2014  IMPRESSION: No definite evidence of metastases. There are 2 nonenlarged but questionable right axillary lymph nodes.  CT abdomen and pelvis w contrast 12/29/2014  IMPRESSION: Circumferential mass  lesion in the proximal right colon above the ileocecal valve consistent with carcinoma of the colon. There is stranding in the pericolonic fat which could be due to tumor extension. No adenopathy.  Constipation without bowel obstruction.  These results will be called to the ordering clinician or representative by the Radiologist Assistant, and communication documented in the PACS or zVision Dashboard.   ASSESSMENT & PLAN: 75 year old African-American female with past medical history of hypertension, presented with fatigue, anemia and dizziness.  1. Right colon cancer, pT3N0M0 stage IIA, poorly differentiated,MSI-high, and sigmoid-rectal colon cancer, pT3N1cM0 stage IIIB, well differentiated,MSI-stable  -I reviewed her surgical pathology findings with patient and her husband in great details. She has 2 multifocal colon adenocarcinoma was distinct features. Her right colon cancer is stage IIA, poorly differentiated, MSI high, has P ref mutation and MLH-1 hypervascular mass lesion, which supports a sporadic colon cancer, unlikely Lynch syndrome. 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 -Her staging CT scan was negative for distant metastasis. Her pre-op CEA was elevated at 7.5, post op CEA came down to 1.7 (normal) -She has started adjuvant chemotherapy Xeloda, tolerating very well, no complaints except skin issue on her palms  -Lab results reviewed with her, CBC and CMP are unremarkable, she will start 5rd cycle of Xeloda tomorrow.   2. Hypertension -Her blood pressure has been borderline low lately. she has contacted her per my care physician, who agrees to hold off maxzide  -She'll continue follow-up with her primary care physician  3. Anemia, iron deficient from her colon cancer -She required blood transfusion a total of 3 units before her colon surgery -Repeated on study showed ferritin 33, mildly low serum iron  and saturation, consistent with mild iron deficiency. -She is on oralferrous sulfate 2 times a day, tolerating well. We'll continue -Her anemia has resolved,  We'll continue monitoring when she is on chemotherapy  4. Malnutrition and weight loss -much improved, she gained some weight back   5. 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 The patient knows to call the clinic with any problems, questions or concerns. I spent 15 minutes counseling the patient face to face. The total time spent in the appointment was 20 minutes and more  than 50% was on counseling.     Truitt Merle, MD 06/28/2015

## 2015-06-28 NOTE — Telephone Encounter (Signed)
Gave and printed appt sched and avs for pt for may °

## 2015-06-29 ENCOUNTER — Encounter: Payer: Self-pay | Admitting: Pharmacist

## 2015-06-29 NOTE — Progress Notes (Signed)
Oral Chemotherapy Follow-Up Form  Original Start date of oral chemotherapy: _2/1/17_   Called patient today to follow up regarding patient's oral chemotherapy medication: _Xeloda 1500 mg BID two weeks on, 1 week off_  Pt is doing well today. She saw Dr. Burr Medico yesterday and will begin cycle 5 of xeloda. Issues with hand/foot syndrome with skin peeling. This has improved with urea cream. She uses this twice daily. Energy levels are better along with appetite. No missed doses  Pt reports __0__ tablets/doses missed in the last month.    Pt reports the following side effects: _none__   Will follow up and call patient again in _3 weeks___   Thank you,  Montel Clock, PharmD, [facility]

## 2015-07-17 MED FILL — CAPECITABINE 500 MG TABLET: 500 | 14 days supply | Qty: 84 | Fill #1

## 2015-07-18 ENCOUNTER — Telehealth: Payer: Self-pay | Admitting: Hematology

## 2015-07-18 ENCOUNTER — Other Ambulatory Visit (HOSPITAL_BASED_OUTPATIENT_CLINIC_OR_DEPARTMENT_OTHER): Payer: Medicare Other

## 2015-07-18 ENCOUNTER — Ambulatory Visit (HOSPITAL_BASED_OUTPATIENT_CLINIC_OR_DEPARTMENT_OTHER): Payer: Medicare Other | Admitting: Hematology

## 2015-07-18 ENCOUNTER — Ambulatory Visit: Payer: Medicare Other | Admitting: Hematology

## 2015-07-18 ENCOUNTER — Encounter: Payer: Self-pay | Admitting: Hematology

## 2015-07-18 ENCOUNTER — Other Ambulatory Visit: Payer: Medicare Other

## 2015-07-18 VITALS — BP 85/59 | HR 89 | Temp 97.9°F | Resp 17 | Ht 62.0 in | Wt 134.9 lb

## 2015-07-18 DIAGNOSIS — C187 Malignant neoplasm of sigmoid colon: Secondary | ICD-10-CM

## 2015-07-18 DIAGNOSIS — R634 Abnormal weight loss: Secondary | ICD-10-CM | POA: Diagnosis not present

## 2015-07-18 DIAGNOSIS — C772 Secondary and unspecified malignant neoplasm of intra-abdominal lymph nodes: Secondary | ICD-10-CM

## 2015-07-18 DIAGNOSIS — E46 Unspecified protein-calorie malnutrition: Secondary | ICD-10-CM | POA: Diagnosis not present

## 2015-07-18 DIAGNOSIS — I10 Essential (primary) hypertension: Secondary | ICD-10-CM

## 2015-07-18 DIAGNOSIS — D509 Iron deficiency anemia, unspecified: Secondary | ICD-10-CM

## 2015-07-18 DIAGNOSIS — C182 Malignant neoplasm of ascending colon: Secondary | ICD-10-CM

## 2015-07-18 DIAGNOSIS — L271 Localized skin eruption due to drugs and medicaments taken internally: Secondary | ICD-10-CM

## 2015-07-18 LAB — COMPREHENSIVE METABOLIC PANEL WITH GFR
ALT: 18 U/L (ref 0–55)
AST: 24 U/L (ref 5–34)
Albumin: 3.7 g/dL (ref 3.5–5.0)
Alkaline Phosphatase: 48 U/L (ref 40–150)
Anion Gap: 8 meq/L (ref 3–11)
BUN: 10.4 mg/dL (ref 7.0–26.0)
CO2: 25 meq/L (ref 22–29)
Calcium: 9.1 mg/dL (ref 8.4–10.4)
Chloride: 109 meq/L (ref 98–109)
Creatinine: 0.8 mg/dL (ref 0.6–1.1)
EGFR: 83 ml/min/1.73 m2 — ABNORMAL LOW
Glucose: 79 mg/dL (ref 70–140)
Potassium: 3.6 meq/L (ref 3.5–5.1)
Sodium: 142 meq/L (ref 136–145)
Total Bilirubin: 0.78 mg/dL (ref 0.20–1.20)
Total Protein: 7.3 g/dL (ref 6.4–8.3)

## 2015-07-18 LAB — CBC WITH DIFFERENTIAL/PLATELET
BASO%: 0.3 % (ref 0.0–2.0)
BASOS ABS: 0 10*3/uL (ref 0.0–0.1)
EOS ABS: 0.3 10*3/uL (ref 0.0–0.5)
EOS%: 3.9 % (ref 0.0–7.0)
HCT: 39.2 % (ref 34.8–46.6)
HGB: 12.8 g/dL (ref 11.6–15.9)
LYMPH#: 2.1 10*3/uL (ref 0.9–3.3)
LYMPH%: 29 % (ref 14.0–49.7)
MCH: 30.4 pg (ref 25.1–34.0)
MCHC: 32.7 g/dL (ref 31.5–36.0)
MCV: 93.1 fL (ref 79.5–101.0)
MONO#: 0.7 10*3/uL (ref 0.1–0.9)
MONO%: 9.8 % (ref 0.0–14.0)
NEUT#: 4.1 10*3/uL (ref 1.5–6.5)
NEUT%: 57 % (ref 38.4–76.8)
NRBC: 0 % (ref 0–0)
PLATELETS: 305 10*3/uL (ref 145–400)
RBC: 4.21 10*6/uL (ref 3.70–5.45)
RDW: 20.8 % — AB (ref 11.2–14.5)
WBC: 7.2 10*3/uL (ref 3.9–10.3)

## 2015-07-18 NOTE — Telephone Encounter (Signed)
per pof to sch pt appt-gave pt copy of avs °

## 2015-07-18 NOTE — Progress Notes (Signed)
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 Colonoscopy showed a ulcerated completely obstructing large mass in the rectosigmoid colon, 15 cm from anus. Diverticulosis in the rectosigmoid colon. Biopsy was taken from the mass.   12/29/2014 Imaging CT chest, abdomen and pelvis showed a circumferential mass lesion in the proximal right colon above the ileocecal valve consistent with carcinoma of the colon. No adenopathy. No distant metastasis.   02/15/2015 Initial Diagnosis Cancer of right colon ([place])   02/15/2015 Surgery Laparoscopic right colectomy and sigmoidectomy   02/15/2015 Pathology Results Right colon segmental resection showed a invasive poorly differentiated adenocarcinoma, spanning 8 cm, T3, margins were negative, 16 lymph nodes. No lymphovascular invasion, no perineural invasion. MMR showed loss of expression of ML H1 and PMS2, MSI-H   02/15/2015 Pathology Results Sigmoid colon segmental resection showed invasive well differentiated adenocarcinoma, 5.5 cm, T3, 10 lymph nodes were negative, (+) tumor deposit. 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 female is here because of her recently diagnosed colon cancer. She is accompanied by her husband to our multidisciplinary GI clinic today.  She presented with fatigue and dizzines in 10/2014, she also had decreased appetite, mild intermittent blood in stool, moderate constipation, and weight loss about 30lbs. She was seen by her primary care physician, was found to be anemic, and required blood transfusion in October 2016. She was referred to gastroenterologist Dr. Oletta Lamas, and underwent EGD and colonoscopy on 12/21/2014, which showed a ulcerated mass in the sigmoid-rectum, 15 cm from anal verge, in the scope was not able to advance. Multiple biopsy from the sigmoid colon mass showed invasive adenocarcinoma. CT of the chest abdomen and pelvis was obtained, which showed a circumferential mass lesion in the proximal right colon above the ileocecal valve. She was referred to colorectal surgeon Dr. Marcello Moores, and underwent left scopic right colectomy and sigmoidectomy on 02/15/2015. She was discharged home 5 days after her surgery.  She has revoered very well from her surgery. She has greast appetite, eats well, normal BM, takes miralax as needed, no pain gained 6-7 lbs weight back since the surgery. She has good energy level, and remains to be physically active at home.  She has no children, no family history of colon cancer or other malignancy except her father had brain tumor.  CURRENT THERAPY: Xeloda 1559m bid,  2 weeks on, 1 week off, started on 04/04/2015, plan for 8 cycles   INTERIM HISTORY:  Tamma returns for follow-up. She has completed cycle 5 Xeloda last week. She Has been tolerating Xeloda very well, mild fatigue, but remains to be  physically very active. Her husband had a fall in a grocery store last week, with significant bruising and a soft tissue injury, and she has been his caregiver. Her skin dryness and peeling has improved with urea cream. She otherwise is doing well, denies any new symptoms. She has good appetite and eating well, no diarrhea.  MEDICAL HISTORY:  Past Medical History  Diagnosis Date  . Hypertension   . Falls     pt states fell twice this week   . History of blood transfusion     12/2014  . Anemia     SURGICAL HISTORY: Past Surgical History  Procedure Laterality Date  . Dilation and curettage of uterus    . Colonscopy     . Upper gi endoscopy    . Laparoscopic partial colectomy N/A 02/15/2015    Procedure: LAPAROSCOPIC RIGHT COLECTOMY AND SIGMOIDECTOMY PROCTOSCOPY;  Surgeon: Leighton Ruff, MD;  Location: [facility];  Service: General;  Laterality: N/A;  . Upper gi endoscopy  02/15/2015    Procedure: UPPER GI ENDOSCOPY;  Surgeon: Leighton Ruff, MD;  Location: [facility];  Service: General;;    SOCIAL HISTORY: Social History   Social History  . Marital Status: Married    Spouse Name: N/A  . Number of Children: N/A  . Years of Education: N/A   Occupational History  . Not on file.   Social History Main Topics  . Smoking status: Former Smoker -- 0.50 packs/day for 20 years    Types: Cigarettes    Quit date: 03/04/1995  . Smokeless tobacco: Never Used  . Alcohol Use: No  . Drug Use: No  . Sexual Activity: Not on file   Other Topics Concern  . Not on file   Social History Narrative   Married, husband [name]   No children   Independent ADLs, drives    FAMILY HISTORY: Family History  Problem Relation Age of Onset  . Cancer Father 72    brain tumor     ALLERGIES:  has No Known Allergies.  MEDICATIONS:  Current Outpatient Prescriptions  Medication Sig Dispense Refill  . acetaminophen (TYLENOL) 325 MG tablet Take 2 tablets (650 mg total) by mouth every 6 (six) hours as  needed.    . ferrous sulfate 325 (65 FE) MG tablet Take 325 mg by mouth 2 (two) times daily.  3  . polyethylene glycol (MIRALAX / GLYCOLAX) packet Take 17 g by mouth daily as needed for mild constipation, moderate constipation or severe constipation.    . urea (CARMOL) 10 % cream Apply topically as needed. 71 g 1  . capecitabine (XELODA) 500 MG tablet Take 3 tablets (1,500 mg total) by mouth 2 (two) times daily after a meal. Takes for 14 days,  Off  7 days. (Patient not taking: Reported on 07/18/2015) 84 tablet 3   No current facility-administered medications for this visit.    REVIEW OF SYSTEMS:   Constitutional: Denies fevers, chills or abnormal night sweats Eyes: Denies blurriness of vision, double vision or watery eyes Ears, nose, mouth, throat, and face: Denies mucositis or sore throat Respiratory: Denies cough, dyspnea or wheezes Cardiovascular: Denies palpitation, chest discomfort or lower extremity swelling Gastrointestinal:  Denies nausea, heartburn or change in bowel habits Skin: Denies  abnormal skin rashes Lymphatics: Denies new lymphadenopathy or easy bruising Neurological:Denies numbness, tingling or new weaknesses Behavioral/Psych: Mood is stable, no new changes  All other systems were reviewed with the patient and are negative.  PHYSICAL EXAMINATION: ECOG PERFORMANCE STATUS: 0  Filed Vitals:   07/18/15 1322  BP: 85/59  Pulse: 89  Temp: 97.9 F (36.6 C)  Resp: 17   Filed Weights   07/18/15 1322  Weight: 134 lb 14.4 oz (61.19 kg)    GENERAL:alert, no distress and comfortable SKIN: skin color, texture, turgor are normal, no rashes or significant lesions, (+)  Skin pigmentations on post hands, especially on the palm, with small area of skin peeling,  No cracks or ulceration EYES: normal, conjunctiva are pink and non-injected, sclera clear OROPHARYNX:no exudate, no erythema and lips, buccal mucosa, and tongue normal  NECK: supple, thyroid normal size, non-tender,  without nodularity LYMPH:  no palpable lymphadenopathy in the cervical, axillary or inguinal LUNGS: clear to auscultation and percussion with normal breathing effort HEART: regular rate & rhythm and no murmurs and no lower extremity edema ABDOMEN:abdomen soft, non-tender and normal bowel sounds. Midline surgical scar and laparoscopic surgical scars are all well healed, no skin erythema or discharge. Musculoskeletal:no cyanosis of digits and no clubbing  PSYCH: alert & oriented x 3 with fluent speech NEURO: no focal motor/sensory deficits  LABORATORY DATA:  I have reviewed the data as listed CBC Latest Ref Rng 07/18/2015 06/28/2015 06/05/2015  WBC 3.9 - 10.3 10e3/uL 7.2 7.2 8.6  Hemoglobin 11.6 - 15.9 g/dL 12.8 12.2 11.9  Hematocrit 34.8 - 46.6 % 39.2 38.4 37.4  Platelets 145 - 400 10e3/uL 305 287 325     Recent Labs  02/16/15 0433 02/17/15 0513 02/18/15 0543  06/05/15 1309 06/28/15 0936 07/18/15 1250  NA 136 136 137  < > 142 142 142  K 3.2* 3.6 3.9  < > 3.8 3.5 3.6  CL 101 103 103  --   --   --   --   CO2 '24 28 28  ' < > '29 25 25  ' GLUCOSE 99 93 95  < > 91 98 79  BUN 9 7 <5*  < > 11.4 11.1 10.4  CREATININE 0.82 0.65 0.64  < > 0.8 0.8 0.8  CALCIUM 8.5* 8.2* 8.3*  < > 9.5 9.3 9.1  GFRNONAA >60 >60 >60  --   --   --   --   GFRAA >60 >60 >60  --   --   --   --   PROT  --   --   --   < > 7.1 7.0 7.3  ALBUMIN  --   --   --   < > 3.4* 3.5 3.7  AST  --   --   --   < > '18 23 24  ' ALT  --   --   --   < > <'9 17 18  ' ALKPHOS  --   --   --   < > 61 48 48  BILITOT  --   --   --   < > 0.70 0.60 0.78  < > = values in this interval not displayed.  PATHOLOGY REPORT  Diagnosis 02/15/2015 1. Colon, segmental resection for tumor, right - INVASIVE POORLY DIFFERENTIATED ADENOCARCINOMA, SPANNING 8 CM IN GREATEST DIMENSION. - TUMOR INVADES THROUGH MUSCULARIS PROPRIA TO INVOLVE SUBSEROSAL SOFT TISSUES. - MARGIN IS NEGATIVE. - SIXTEEN BENIGN LYMPH NODES WITH NO TUMOR SEEN (0/16). - SEE ONCOLOGY  TEMPLATE. 2.  Colon, segmental resection for tumor, sigmoid - INVASIVE WELL DIFFERENTIATED ADENOCARCINOMA WITH ABUNDANT EXTRACELLULAR MUCIN, SPANNING 5.5 CM IN GREATEST DIMENSION. - TUMOR INVADES THROUGH MUSCULARIS PROPRIA TO INVOLVE SUBSEROSAL SOFT TISSUES. - MARGINS ARE NEGATIVE. - SATELLITE TUMOR DEPOSIT (DISCONTINUOUS EXTRAMURAL EXTENSION) IS PRESENT. - TEN BENIGN LYMPH NODES WITH NO TUMOR SEEN (0/10). - SEE ONCOLOGY TEMPLATE BELOW. 3. Colon, resection margin (donut), distal anastomotic ring - BENIGN COLORECTAL MUCOSA. - NO TUMOR SEEN.  Microscopic Comment 1. COLON AND RECTUM: Specimen: Right colon. Procedure: Right hemicolectomy. Tumor site: Ascending colon. Specimen integrity: Intact. Macroscopic tumor perforation: Not identified. Invasive tumor: Maximum size: 8 cm. Histologic type(s): Adenocarcinoma. Histologic grade and differentiation: G3: poorly differentiated/high grade. Type of polyp in which invasive carcinoma arose: A definitive precursor polyp is not identified. Microscopic extension of invasive tumor: Tumor invades through muscularis propria to involve subserosal soft tissues. Lymph-Vascular invasion: Definitive lymph/vascular invasion is not identified. Peri-neural invasion: Not identified. Tumor deposit(s) (discontinuous extramural extension): No tumor deposits showing a high grade poorly differentiated adenocarcinoma are identified. Resection margins: Proximal margin: Negative. Distal margin: Negative. Circumferential (radial) (posterior ascending, posterior descending; lateral and posterior mid-rectum; and entire lower 1/3 rectum): Negative. Distance closest margin (if all above margins negative): 4.5 cm (radial margin). Treatment effect (neo-adjuvant therapy): Not applicable. Additional polyp(s): Three additional tubular adenomas are present in the right colon specimen. Non-neoplastic findings: Benign unremarkable appendix. Lymph nodes: number examined: 16  (from the right colon specimen); number positive: 0. Pathologic Staging: pT3, pN0. Ancillary studies: The tumor will be sent for MMR by [place] and MSI by PCR. 2. COLON AND RECTUM: Specimen: Right partial colon (sigmoid colon). Procedure: Right partial colectomy (sigmoid resection). Tumor site: Sigmoid colon (distal aspect of the specimen along the posterior aspect). Specimen integrity: Intact. Macroscopic tumor perforation: Not identified. Invasive tumor: Maximum size: 5.5 cm. Histologic type(s): Adenocarcinoma with abundant extracellular mucin. Histologic grade and differentiation: G1: well differentiated/low grade. Type of polyp in which invasive carcinoma arose: Tumor arose from a tubular adenoma with high grade glandular dysplasia. Microscopic extension of invasive tumor: Tumor invades through muscularis propria to involve subserosal soft tissue. Lymph-Vascular invasion: Definitive lymph/vascular invasion is not identified, however there is a tumor deposit present, see below. Peri-neural invasion: Not identified. Tumor deposit(s) (discontinuous extramural extension): Yes, tumor deposit is present. Resection margins: Proximal margin: Negative. Distal margin: Negative. Circumferential (radial) (posterior ascending, posterior descending; lateral and posterior mid-rectum; and entire lower 1/3 rectum): 3.1 cm. Distance closest margin (if all above margins negative): 2.4 cm (distal margin). Treatment effect (neo-adjuvant therapy): Not applicable. Additional polyp(s): Benign polypoid colorectal mucosa with hyperplastic-type change (possibly representing early hyperplastic polyp formation) is present. Non-neoplastic findings: No additional non-neoplastic findings Lymph nodes: number examined: 10 (from the sigmoid colon specimen); number positive: 0, see comment. Pathologic Staging: pT3, pN1c. Ancillary studies: The tumor will be sent for MMR by IHC and MSI by PCR per colorectal cancer  protocol. Comment: Initially, only 7 lymph nodes are identified in the sigmoid colon specimen. The specimen is placed in fat clearing solution with an additional three lymph nodes identified on second gross assessment. (RH:kh 02-19-15)  Mismatch Repair (MMR) Protein Immunohistochemistry (IHC) IHC Expression Result: MLH1: LOSS OF NUCLEAR EXPRESSION (LESS THAN 5% TUMOR EXPRESSION) MSH2: Preserved nuclear expression (greater 50% tumor expression) MSH6: Preserved nuclear expression (greater 50% tumor expression) PMS2: LOSS OF NUCLEAR EXPRESSION (LESS THAN 5% TUMOR EXPRESSION) * Internal control demonstrates intact nuclear expression Interpretation: ABNORMAL There is loss of the major and minor MMR proteins MLH1 and PMS2.  The loss of expression may be secondary to promoter hyper-methylation, gene mutation or other genetic event. BRAF mutation testing and/or MLH1 methylation testing is indicated. The presence of a BRAF mutation and/or MLH1 hyper-methylation is indicative of a sporadic-type tumor. The absence of either BRAF mutation and/or presence of normal-methylation indicate the possible presence of a hereditary germline mutation (e.g. Lynch syndrome) and referral to genetic counseling is warranted. It is recommended that the loss of protein expression be correlated with molecular based MSI testing.  2. Mismatch Repair (MMR) Protein Immunohistochemistry (IHC) IHC Expression Result: MLH1: Preserved nuclear expression (greater 50% tumor expression) MSH2: Preserved nuclear expression (greater 50% tumor expression) MSH6: Preserved nuclear expression (greater 50% tumor expression) PMS2: Preserved nuclear expression (greater 50% tumor expression) * Internal control demonstrates intact nuclear expression Interpretation: NORMAL       RADIOGRAPHIC STUDIES: I have personally reviewed the radiological images as listed and agreed with the findings in the report.  CT chest wo contrast 12/29/2014    IMPRESSION: No definite evidence of metastases. There are 2 nonenlarged but questionable right axillary lymph nodes.  CT abdomen and pelvis w contrast 12/29/2014  IMPRESSION: Circumferential mass lesion in the proximal right colon above the ileocecal valve consistent with carcinoma of the colon. There is stranding in the pericolonic fat which could be due to tumor extension. No adenopathy.  Constipation without bowel obstruction.  These results will be called to the ordering clinician or representative by the Radiologist Assistant, and communication documented in the PACS or zVision Dashboard.   ASSESSMENT & PLAN: 75 year old African-American female with past medical history of hypertension, presented with fatigue, anemia and dizziness.  1. Right colon cancer, pT3N0M0 stage IIA, poorly differentiated,MSI-high, and sigmoid-rectal colon cancer, pT3N1cM0 stage IIIB, well differentiated,MSI-stable  -I reviewed her surgical pathology findings with patient and her husband in great details. She has 2 multifocal colon adenocarcinoma was distinct features. Her right colon cancer is stage IIA, poorly differentiated, MSI high, has P ref mutation and MLH-1 hypervascular mass lesion, which supports a sporadic colon cancer, unlikely Lynch syndrome. 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 -Her staging CT scan was negative for distant metastasis. Her pre-op CEA was elevated at 7.5, post op CEA came down to 1.7 (normal) -She has started adjuvant chemotherapy Xeloda, tolerating very well, no complaints except skin issue on her palms  -Lab results reviewed with her, CBC and CMP are unremarkable, she will start 6th cycle of Xeloda this friday.   2. Hypertension -Her blood pressure has been borderline low lately. she has contacted her per my care physician, who agrees to hold off maxzide  -She'll continue follow-up with her  primary care physician -I encouraged her to drink more water, avoid dehydration  3. Anemia, iron deficient from her colon cancer -She required blood transfusion a total of 3 units before her colon surgery -Repeated on study showed ferritin 33, mildly low serum iron and saturation, consistent with mild iron deficiency. -She is on oralferrous sulfate 2 times a day, tolerating well. We'll continue -Her anemia has resolved, repeated iron study in April 2017 was normal  4. Malnutrition and weight loss -much improved, she gained some weight back   5. 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 The patient knows to call the clinic with any problems, questions or concerns. I spent 15 minutes counseling the patient face to face. The total time spent in the appointment was 20 minutes and more than 50% was on counseling.     Truitt Merle, MD 07/18/2015

## 2015-07-19 ENCOUNTER — Telehealth: Payer: Self-pay | Admitting: Hematology

## 2015-07-19 ENCOUNTER — Telehealth: Payer: Self-pay | Admitting: *Deleted

## 2015-07-19 NOTE — Telephone Encounter (Signed)
-----   Message from Truitt Merle, MD sent at 07/18/2015  8:29 PM EDT ----- Janifer Adie,   Please call her and let her know that I was wrong about her Xeloda cycle number. She will start cycle 6 this Friday, and she has two more cycles (2 wks on, one week off). I told her today that this will be her last cycle, which is wrong.  So she needs to come back with lab and visit with me in 3 weeks (and will keep her 6 weeks appointments), POF sent.  Thanks,  Krista Blue

## 2015-07-19 NOTE — Telephone Encounter (Signed)
Called pt & informed of xeloda cycles per Dr Burr Medico & she was OK with this.  Informed to expect a call for f/u in 3 wks & keep 6 wk appt.

## 2015-07-19 NOTE — Telephone Encounter (Signed)
per pof to asch pt appt-cld pt and adv of r/s appt and adv Dr Burr Medico wantedt her know that I calculated her chemo cycle wrong, she still has 2 more cycle oral chemo to go. We need to have lab and evaluation before she starts a new cycle

## 2015-08-07 MED FILL — CAPECITABINE 500 MG TABLET: 500 | 14 days supply | Qty: 84 | Fill #2

## 2015-08-09 ENCOUNTER — Telehealth: Payer: Self-pay | Admitting: *Deleted

## 2015-08-09 ENCOUNTER — Other Ambulatory Visit: Payer: Self-pay | Admitting: Hematology

## 2015-08-09 NOTE — Telephone Encounter (Signed)
Oncology Nurse Navigator Documentation  Oncology Nurse Navigator Flowsheets 08/09/2015  Navigator Location [facility]  Navigator Encounter Type Telephone  Telephone Incoming Call;Medication Assistance  Abnormal Finding Date -  Confirmed Diagnosis Date -  Surgery Date -  Patient Visit Type -  Treatment Phase Active Tx--Xeloda   Barriers/Navigation Needs Education--asking if OK for her to start her last cycle of Xeloda on 08/17/15 (sees MD 6/13)  Education Other  Interventions Education Method  Coordination of Care -  Education Method Verbal--informed her that is OK.  Support Groups/Services -  [facility] -  Time Spent with Patient 15  She reports good tolerance to chemo. No additional needs.

## 2015-08-10 ENCOUNTER — Telehealth: Payer: Self-pay | Admitting: Hematology

## 2015-08-10 NOTE — Telephone Encounter (Signed)
cld & spoke topt and gave pt time & date of r/s appt 6/12!@2 :45

## 2015-08-13 ENCOUNTER — Telehealth: Payer: Self-pay | Admitting: Hematology

## 2015-08-13 ENCOUNTER — Encounter: Payer: Self-pay | Admitting: Oncology

## 2015-08-13 ENCOUNTER — Ambulatory Visit (HOSPITAL_BASED_OUTPATIENT_CLINIC_OR_DEPARTMENT_OTHER): Payer: Medicare Other | Admitting: Oncology

## 2015-08-13 ENCOUNTER — Other Ambulatory Visit (HOSPITAL_BASED_OUTPATIENT_CLINIC_OR_DEPARTMENT_OTHER): Payer: Medicare Other

## 2015-08-13 VITALS — BP 120/61 | HR 98 | Temp 98.1°F | Resp 18 | Ht 62.0 in | Wt 139.0 lb

## 2015-08-13 DIAGNOSIS — R634 Abnormal weight loss: Secondary | ICD-10-CM

## 2015-08-13 DIAGNOSIS — C187 Malignant neoplasm of sigmoid colon: Secondary | ICD-10-CM | POA: Diagnosis not present

## 2015-08-13 DIAGNOSIS — E46 Unspecified protein-calorie malnutrition: Secondary | ICD-10-CM

## 2015-08-13 DIAGNOSIS — C182 Malignant neoplasm of ascending colon: Secondary | ICD-10-CM

## 2015-08-13 DIAGNOSIS — L271 Localized skin eruption due to drugs and medicaments taken internally: Secondary | ICD-10-CM

## 2015-08-13 DIAGNOSIS — C772 Secondary and unspecified malignant neoplasm of intra-abdominal lymph nodes: Secondary | ICD-10-CM | POA: Diagnosis not present

## 2015-08-13 DIAGNOSIS — D509 Iron deficiency anemia, unspecified: Secondary | ICD-10-CM | POA: Diagnosis not present

## 2015-08-13 LAB — COMPREHENSIVE METABOLIC PANEL
ALT: 12 U/L (ref 0–55)
AST: 18 U/L (ref 5–34)
Albumin: 3.8 g/dL (ref 3.5–5.0)
Alkaline Phosphatase: 53 U/L (ref 40–150)
Anion Gap: 8 mEq/L (ref 3–11)
BILIRUBIN TOTAL: 1.21 mg/dL — AB (ref 0.20–1.20)
BUN: 12 mg/dL (ref 7.0–26.0)
CHLORIDE: 107 meq/L (ref 98–109)
CO2: 26 meq/L (ref 22–29)
CREATININE: 0.8 mg/dL (ref 0.6–1.1)
Calcium: 9.5 mg/dL (ref 8.4–10.4)
EGFR: 80 mL/min/{1.73_m2} — ABNORMAL LOW (ref >90)
GLUCOSE: 76 mg/dL (ref 70–140)
Potassium: 3.9 mEq/L (ref 3.5–5.1)
SODIUM: 141 meq/L (ref 136–145)
TOTAL PROTEIN: 7.4 g/dL (ref 6.4–8.3)

## 2015-08-13 LAB — CBC WITH DIFFERENTIAL/PLATELET
BASO%: 0.3 % (ref 0.0–2.0)
Basophils Absolute: 0 10*3/uL (ref 0.0–0.1)
EOS%: 6.1 % (ref 0.0–7.0)
Eosinophils Absolute: 0.5 10*3/uL (ref 0.0–0.5)
HCT: 40.5 % (ref 34.8–46.6)
HGB: 12.9 g/dL (ref 11.6–15.9)
LYMPH%: 27.4 % (ref 14.0–49.7)
MCH: 30.9 pg (ref 25.1–34.0)
MCHC: 31.8 g/dL (ref 31.5–36.0)
MCV: 97.2 fL (ref 79.5–101.0)
MONO#: 0.7 10*3/uL (ref 0.1–0.9)
MONO%: 9.8 % (ref 0.0–14.0)
NEUT%: 56.4 % (ref 38.4–76.8)
NEUTROS ABS: 4.2 10*3/uL (ref 1.5–6.5)
PLATELETS: 284 10*3/uL (ref 145–400)
RBC: 4.16 10*6/uL (ref 3.70–5.45)
RDW: 18.7 % — ABNORMAL HIGH (ref 11.2–14.5)
WBC: 7.4 10*3/uL (ref 3.9–10.3)
lymph#: 2 10*3/uL (ref 0.9–3.3)

## 2015-08-13 NOTE — Progress Notes (Signed)
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)   Staging form: Colon and Rectum, AJCC 7th Edition     Pathologic stage from 02/15/2015: Stage IIIB (T3, N1c, cM0) - Signed by Truitt Merle, MD on 03/23/2015        Cancer of right colon ([place])   12/21/2014 Procedure EGD was normal. Colonoscopy showed a ulcerated completely obstructing large mass in the rectosigmoid colon, 15 cm from anus. Diverticulosis in the rectosigmoid colon. Biopsy was taken from the mass.   12/29/2014 Imaging CT chest, abdomen and pelvis showed a circumferential mass lesion in the proximal right colon above the ileocecal valve consistent with carcinoma of the colon. No adenopathy. No distant metastasis.   02/15/2015 Initial Diagnosis Cancer of right colon ([place])   02/15/2015 Surgery Laparoscopic right colectomy and sigmoidectomy   02/15/2015 Pathology Results Right colon segmental resection showed a invasive poorly differentiated adenocarcinoma, spanning 8 cm, T3, margins were negative, 16 lymph nodes. No lymphovascular invasion, no perineural invasion. MMR showed loss of expression of ML H1 and PMS2, MSI-H   02/15/2015 Pathology Results Sigmoid colon segmental resection showed invasive well differentiated adenocarcinoma, 5.5 cm, T3, 10 lymph nodes were negative, (+) tumor deposit. 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 female is here because of her recently diagnosed colon cancer. She is here by herself in clinic today.  She presented with fatigue and dizzines in 10/2014, she also had decreased appetite, mild intermittent blood in stool, moderate constipation, and weight loss about 30lbs. She was seen by her primary care physician, was found to be anemic, and required blood transfusion in October 2016. She was referred to gastroenterologist Dr. Oletta Lamas, and underwent EGD and colonoscopy on 12/21/2014, which showed a ulcerated mass in the sigmoid-rectum, 15 cm from anal verge, in the scope was not able to advance. Multiple biopsy from the sigmoid colon mass showed invasive adenocarcinoma. CT of the chest abdomen and pelvis was obtained, which showed a circumferential mass lesion in the proximal right colon above the ileocecal valve. She was referred to colorectal surgeon Dr. Marcello Moores, and underwent left scopic right colectomy and sigmoidectomy on 02/15/2015. She was discharged home 5 days after her surgery.  She has revoered very well from her surgery. She has greast appetite, eats well, normal BM, takes miralax as needed, no pain gained 6-7 lbs weight back since the surgery. She has good energy level, and remains to be physically active at home.  She has no children, no family history of colon cancer or other malignancy except her father had brain tumor.  CURRENT THERAPY: Xeloda '1500mg'$  bid, 2 weeks on, 1  week off, started on 04/04/2015, plan for 8 cycles   INTERIM HISTORY:  Olivia Jackson returns for follow-up. She has completed cycle 6 Xeloda last week. She Has been tolerating Xeloda very well, mild fatigue, but remains physically very active. Her skin dryness and  peeling has improved with urea cream. She otherwise is doing well, denies any new symptoms. She has good appetite and eating well, no diarrhea. Gaining weight.  MEDICAL HISTORY:  Past Medical History  Diagnosis Date  . Hypertension   . Falls     pt states fell twice this week   . History of blood transfusion     12/2014  . Anemia     SURGICAL HISTORY: Past Surgical History  Procedure Laterality Date  . Dilation and curettage of uterus    . Colonscopy     . Upper gi endoscopy    . Laparoscopic partial colectomy N/A 02/15/2015    Procedure: LAPAROSCOPIC RIGHT COLECTOMY AND SIGMOIDECTOMY PROCTOSCOPY;  Surgeon: Leighton Ruff, MD;  Location: [facility];  Service: General;  Laterality: N/A;  . Upper gi endoscopy  02/15/2015    Procedure: UPPER GI ENDOSCOPY;  Surgeon: Leighton Ruff, MD;  Location: [facility];  Service: General;;    SOCIAL HISTORY: Social History   Social History  . Marital Status: Married    Spouse Name: N/A  . Number of Children: N/A  . Years of Education: N/A   Occupational History  . Not on file.   Social History Main Topics  . Smoking status: Former Smoker -- 0.50 packs/day for 20 years    Types: Cigarettes    Quit date: 03/04/1995  . Smokeless tobacco: Never Used  . Alcohol Use: No  . Drug Use: No  . Sexual Activity: Not on file   Other Topics Concern  . Not on file   Social History Narrative   Married, husband [name]   No children   Independent ADLs, drives    FAMILY HISTORY: Family History  Problem Relation Age of Onset  . Cancer Father 1    brain tumor     ALLERGIES:  has No Known Allergies.  MEDICATIONS:  Current Outpatient Prescriptions  Medication Sig Dispense Refill  . acetaminophen (TYLENOL) 325 MG tablet Take 2 tablets (650 mg total) by mouth every 6 (six) hours as needed.    . capecitabine (XELODA) 500 MG tablet Take 3 tablets (1,500 mg total) by mouth 2 (two) times daily after a meal. Takes for 14 days,  Off  7 days. (Patient not  taking: Reported on 07/18/2015) 84 tablet 3  . ferrous sulfate 325 (65 FE) MG tablet Take 325 mg by mouth 2 (two) times daily.  3  . polyethylene glycol (MIRALAX / GLYCOLAX) packet Take 17 g by mouth daily as needed for mild constipation, moderate constipation or severe constipation.    . urea (CARMOL) 10 % cream Apply topically as needed. 71 g 1   No current facility-administered medications for this visit.    REVIEW OF SYSTEMS:   Constitutional: Denies fevers, chills or abnormal night sweats Eyes: Denies blurriness of vision, double vision or watery eyes Ears, nose, mouth, throat, and face: Denies mucositis or sore throat Respiratory: Denies cough, dyspnea or wheezes Cardiovascular: Denies palpitation, chest discomfort or lower extremity swelling Gastrointestinal:  Denies nausea, heartburn or change in bowel habits Skin: Denies abnormal skin rashes Lymphatics: Denies new lymphadenopathy or easy bruising Neurological:Denies numbness, tingling or new weaknesses Behavioral/Psych: Mood is stable, no new changes  All other systems were reviewed  with the patient and are negative.  PHYSICAL EXAMINATION: ECOG PERFORMANCE STATUS: 0  Filed Vitals:   08/13/15 1520  BP: 120/61  Pulse: 98  Temp: 98.1 F (36.7 C)  Resp: 18   Filed Weights   08/13/15 1520  Weight: 139 lb (63.05 kg)    GENERAL:alert, no distress and comfortable SKIN: skin color, texture, turgor are normal, no rashes or significant lesions, (+)  Skin pigmentations on post hands, especially on the palm, with small area of skin peeling,  No cracks or ulceration EYES: normal, conjunctiva are pink and non-injected, sclera clear OROPHARYNX:no exudate, no erythema and lips, buccal mucosa, and tongue normal  NECK: supple, thyroid normal size, non-tender, without nodularity LYMPH:  no palpable lymphadenopathy in the cervical, axillary or inguinal LUNGS: clear to auscultation and percussion with normal breathing effort HEART:  regular rate & rhythm and no murmurs and no lower extremity edema ABDOMEN:abdomen soft, non-tender and normal bowel sounds. Midline surgical scar and laparoscopic surgical scars are all well healed, no skin erythema or discharge. Musculoskeletal:no cyanosis of digits and no clubbing  PSYCH: alert & oriented x 3 with fluent speech NEURO: no focal motor/sensory deficits  LABORATORY DATA:  I have reviewed the data as listed CBC Latest Ref Rng 08/13/2015 07/18/2015 06/28/2015  WBC 3.9 - 10.3 10e3/uL 7.4 7.2 7.2  Hemoglobin 11.6 - 15.9 g/dL 12.9 12.8 12.2  Hematocrit 34.8 - 46.6 % 40.5 39.2 38.4  Platelets 145 - 400 10e3/uL 284 305 287     Recent Labs  02/16/15 0433 02/17/15 0513 02/18/15 0543  06/28/15 0936 07/18/15 1250 08/13/15 1427  NA 136 136 137  < > 142 142 141  K 3.2* 3.6 3.9  < > 3.5 3.6 3.9  CL 101 103 103  --   --   --   --   CO2 _0 < > _1 GLUCOSE 99 93 95  < > 98 79 76  BUN 9 7 <5*  < > 11.1 10.4 12.0  CREATININE 0.82 0.65 0.64  < > 0.8 0.8 0.8  CALCIUM 8.5* 8.2* 8.3*  < > 9.3 9.1 9.5  GFRNONAA >60 >60 >60  --   --   --   --   GFRAA >60 >60 >60  --   --   --   --   PROT  --   --   --   < > 7.0 7.3 7.4  ALBUMIN  --   --   --   < > 3.5 3.7 3.8  AST  --   --   --   < > _2 ALT  --   --   --   < > _3 ALKPHOS  --   --   --   < > 48 48 53  BILITOT  --   --   --   < > 0.60 0.78 1.21*  < > = values in this interval not displayed.  PATHOLOGY REPORT  Diagnosis 02/15/2015 1. Colon, segmental resection for tumor, right - INVASIVE POORLY DIFFERENTIATED ADENOCARCINOMA, SPANNING 8 CM IN GREATEST DIMENSION. - TUMOR INVADES THROUGH MUSCULARIS PROPRIA TO INVOLVE SUBSEROSAL SOFT TISSUES. - MARGIN IS NEGATIVE. - SIXTEEN BENIGN LYMPH NODES WITH NO TUMOR SEEN (0/16). - SEE ONCOLOGY TEMPLATE. 2. Colon, segmental resection for tumor, sigmoid - INVASIVE WELL DIFFERENTIATED ADENOCARCINOMA WITH ABUNDANT EXTRACELLULAR MUCIN, SPANNING 5.5 CM IN GREATEST  DIMENSION. - TUMOR INVADES THROUGH MUSCULARIS PROPRIA TO INVOLVE SUBSEROSAL SOFT  TISSUES. - MARGINS ARE NEGATIVE. - SATELLITE TUMOR DEPOSIT (DISCONTINUOUS EXTRAMURAL EXTENSION) IS PRESENT. - TEN BENIGN LYMPH NODES WITH NO TUMOR SEEN (0/10). - SEE ONCOLOGY TEMPLATE BELOW. 3. Colon, resection margin (donut), distal anastomotic ring - BENIGN COLORECTAL MUCOSA. - NO TUMOR SEEN.  Microscopic Comment 1. COLON AND RECTUM: Specimen: Right colon. Procedure: Right hemicolectomy. Tumor site: Ascending colon. Specimen integrity: Intact. Macroscopic tumor perforation: Not identified. Invasive tumor: Maximum size: 8 cm. Histologic type(s): Adenocarcinoma. Histologic grade and differentiation: G3: poorly differentiated/high grade. Type of polyp in which invasive carcinoma arose: A definitive precursor polyp is not identified. Microscopic extension of invasive tumor: Tumor invades through muscularis propria to involve subserosal soft tissues. Lymph-Vascular invasion: Definitive lymph/vascular invasion is not identified. Peri-neural invasion: Not identified. Tumor deposit(s) (discontinuous extramural extension): No tumor deposits showing a high grade poorly differentiated adenocarcinoma are identified. Resection margins: Proximal margin: Negative. Distal margin: Negative. Circumferential (radial) (posterior ascending, posterior descending; lateral and posterior mid-rectum; and entire lower 1/3 rectum): Negative. Distance closest margin (if all above margins negative): 4.5 cm (radial margin). Treatment effect (neo-adjuvant therapy): Not applicable. Additional polyp(s): Three additional tubular adenomas are present in the right colon specimen. Non-neoplastic findings: Benign unremarkable appendix. Lymph nodes: number examined: 16 (from the right colon specimen); number positive: 0. Pathologic Staging: pT3, pN0. Ancillary studies: The tumor will be sent for MMR by [place] and MSI by PCR. 2. COLON AND  RECTUM: Specimen: Right partial colon (sigmoid colon). Procedure: Right partial colectomy (sigmoid resection). Tumor site: Sigmoid colon (distal aspect of the specimen along the posterior aspect). Specimen integrity: Intact. Macroscopic tumor perforation: Not identified. Invasive tumor: Maximum size: 5.5 cm. Histologic type(s): Adenocarcinoma with abundant extracellular mucin. Histologic grade and differentiation: G1: well differentiated/low grade. Type of polyp in which invasive carcinoma arose: Tumor arose from a tubular adenoma with high grade glandular dysplasia. Microscopic extension of invasive tumor: Tumor invades through muscularis propria to involve subserosal soft tissue. Lymph-Vascular invasion: Definitive lymph/vascular invasion is not identified, however there is a tumor deposit present, see below. Peri-neural invasion: Not identified. Tumor deposit(s) (discontinuous extramural extension): Yes, tumor deposit is present. Resection margins: Proximal margin: Negative. Distal margin: Negative. Circumferential (radial) (posterior ascending, posterior descending; lateral and posterior mid-rectum; and entire lower 1/3 rectum): 3.1 cm. Distance closest margin (if all above margins negative): 2.4 cm (distal margin). Treatment effect (neo-adjuvant therapy): Not applicable. Additional polyp(s): Benign polypoid colorectal mucosa with hyperplastic-type change (possibly representing early hyperplastic polyp formation) is present. Non-neoplastic findings: No additional non-neoplastic findings Lymph nodes: number examined: 10 (from the sigmoid colon specimen); number positive: 0, see comment. Pathologic Staging: pT3, pN1c. Ancillary studies: The tumor will be sent for MMR by IHC and MSI by PCR per colorectal cancer protocol. Comment: Initially, only 7 lymph nodes are identified in the sigmoid colon specimen. The specimen is placed in fat clearing solution with an additional three lymph  nodes identified on second gross assessment. (RH:kh 02-19-15)  Mismatch Repair (MMR) Protein Immunohistochemistry (IHC) IHC Expression Result: MLH1: LOSS OF NUCLEAR EXPRESSION (LESS THAN 5% TUMOR EXPRESSION) MSH2: Preserved nuclear expression (greater 50% tumor expression) MSH6: Preserved nuclear expression (greater 50% tumor expression) PMS2: LOSS OF NUCLEAR EXPRESSION (LESS THAN 5% TUMOR EXPRESSION) * Internal control demonstrates intact nuclear expression Interpretation: ABNORMAL There is loss of the major and minor MMR proteins MLH1 and PMS2. The loss of expression may be secondary to promoter hyper-methylation, gene mutation or other genetic event. BRAF mutation testing and/or MLH1 methylation testing is indicated. The presence of a BRAF mutation  and/or MLH1 hyper-methylation is indicative of a sporadic-type tumor. The absence of either BRAF mutation and/or presence of normal-methylation indicate the possible presence of a hereditary germline mutation (e.g. Lynch syndrome) and referral to genetic counseling is warranted. It is recommended that the loss of protein expression be correlated with molecular based MSI testing.  2. Mismatch Repair (MMR) Protein Immunohistochemistry (IHC) IHC Expression Result: MLH1: Preserved nuclear expression (greater 50% tumor expression) MSH2: Preserved nuclear expression (greater 50% tumor expression) MSH6: Preserved nuclear expression (greater 50% tumor expression) PMS2: Preserved nuclear expression (greater 50% tumor expression) * Internal control demonstrates intact nuclear expression Interpretation: NORMAL       RADIOGRAPHIC STUDIES: I have personally reviewed the radiological images as listed and agreed with the findings in the report.  CT chest wo contrast 12/29/2014  IMPRESSION: No definite evidence of metastases. There are 2 nonenlarged but questionable right axillary lymph nodes.  CT abdomen and pelvis w contrast 12/29/2014   IMPRESSION: Circumferential mass lesion in the proximal right colon above the ileocecal valve consistent with carcinoma of the colon. There is stranding in the pericolonic fat which could be due to tumor extension. No adenopathy.  Constipation without bowel obstruction.  These results will be called to the ordering clinician or representative by the Radiologist Assistant, and communication documented in the PACS or zVision Dashboard.   ASSESSMENT & PLAN: 75 year old African-American female with past medical history of hypertension, presented with fatigue, anemia and dizziness.  1. Right colon cancer, pT3N0M0 stage IIA, poorly differentiated,MSI-high, and sigmoid-rectal colon cancer, pT3N1cM0 stage IIIB, well differentiated,MSI-stable  -I reviewed her surgical pathology findings with patient and her husband in great details. She has 2 multifocal colon adenocarcinoma was distinct features. Her right colon cancer is stage IIA, poorly differentiated, MSI high, has P ref mutation and MLH-1 hypervascular mass lesion, which supports a sporadic colon cancer, unlikely Lynch syndrome. 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 -Her staging CT scan was negative for distant metastasis. Her pre-op CEA was elevated at 7.5, post op CEA came down to 1.7 (normal) -She has started adjuvant chemotherapy Xeloda, tolerating very well, no complaints except skin issue on her palms  -Lab results reviewed with her, CBC is unremarkable, she will start 7th cycle of Xeloda this friday.   2. Hypertension -Her blood pressure has been borderline low lately. she has contacted her per my care physician, who agrees to hold off maxzide  -She'll continue follow-up with her primary care physician -I encouraged her to drink more water, avoid dehydration  3. Anemia, iron deficient from her colon cancer -She required blood transfusion a total of 3 units  before her colon surgery -Repeated on study showed ferritin 33, mildly low serum iron and saturation, consistent with mild iron deficiency. -She is on oral ferrous sulfate 2 times a day, tolerating well. We'll continue -Her anemia has resolved, repeated iron study in April 2017 was normal  4. Malnutrition and weight loss -much improved, she gained some weight back   5. 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 The patient knows to call the clinic with any problems, questions or concerns. I spent 15 minutes counseling the patient face to face. The total time spent in  the appointment was 20 minutes and more than 50% was on counseling.     Mikey Bussing, NP 08/13/2015

## 2015-08-13 NOTE — Telephone Encounter (Signed)
per pof to sch pt appt-gave pt copy of avs °

## 2015-08-14 ENCOUNTER — Ambulatory Visit: Payer: Medicare Other | Admitting: Hematology

## 2015-08-14 ENCOUNTER — Other Ambulatory Visit: Payer: Medicare Other

## 2015-08-15 ENCOUNTER — Other Ambulatory Visit: Payer: Medicare Other

## 2015-08-29 ENCOUNTER — Other Ambulatory Visit: Payer: Medicare Other

## 2015-08-29 ENCOUNTER — Ambulatory Visit: Payer: Medicare Other | Admitting: Hematology

## 2015-09-06 MED FILL — CAPECITABINE 500 MG TABLET: 500 | 14 days supply | Qty: 84 | Fill #3

## 2015-09-07 ENCOUNTER — Encounter: Payer: Self-pay | Admitting: Hematology

## 2015-09-07 ENCOUNTER — Ambulatory Visit (HOSPITAL_BASED_OUTPATIENT_CLINIC_OR_DEPARTMENT_OTHER): Payer: Medicare Other | Admitting: Hematology

## 2015-09-07 ENCOUNTER — Telehealth: Payer: Self-pay | Admitting: Hematology

## 2015-09-07 ENCOUNTER — Other Ambulatory Visit (HOSPITAL_BASED_OUTPATIENT_CLINIC_OR_DEPARTMENT_OTHER): Payer: Medicare Other

## 2015-09-07 VITALS — BP 113/72 | HR 80 | Temp 98.0°F | Resp 18 | Ht 62.0 in | Wt 141.0 lb

## 2015-09-07 DIAGNOSIS — C772 Secondary and unspecified malignant neoplasm of intra-abdominal lymph nodes: Secondary | ICD-10-CM

## 2015-09-07 DIAGNOSIS — C182 Malignant neoplasm of ascending colon: Secondary | ICD-10-CM

## 2015-09-07 DIAGNOSIS — C187 Malignant neoplasm of sigmoid colon: Secondary | ICD-10-CM

## 2015-09-07 DIAGNOSIS — L271 Localized skin eruption due to drugs and medicaments taken internally: Secondary | ICD-10-CM | POA: Diagnosis not present

## 2015-09-07 DIAGNOSIS — D509 Iron deficiency anemia, unspecified: Secondary | ICD-10-CM

## 2015-09-07 DIAGNOSIS — I10 Essential (primary) hypertension: Secondary | ICD-10-CM

## 2015-09-07 LAB — COMPREHENSIVE METABOLIC PANEL
ALBUMIN: 3.7 g/dL (ref 3.5–5.0)
ALK PHOS: 61 U/L (ref 40–150)
ALT: 10 U/L (ref 0–55)
ANION GAP: 11 meq/L (ref 3–11)
AST: 17 U/L (ref 5–34)
BUN: 7 mg/dL (ref 7.0–26.0)
CALCIUM: 9.3 mg/dL (ref 8.4–10.4)
CHLORIDE: 108 meq/L (ref 98–109)
CO2: 23 mEq/L (ref 22–29)
CREATININE: 0.8 mg/dL (ref 0.6–1.1)
EGFR: 81 mL/min/{1.73_m2} — ABNORMAL LOW (ref >90)
Glucose: 86 mg/dl (ref 70–140)
POTASSIUM: 3.8 meq/L (ref 3.5–5.1)
Sodium: 142 mEq/L (ref 136–145)
Total Bilirubin: 0.72 mg/dL (ref 0.20–1.20)
Total Protein: 7.4 g/dL (ref 6.4–8.3)

## 2015-09-07 LAB — CBC WITH DIFFERENTIAL/PLATELET
BASO%: 0.4 % (ref 0.0–2.0)
Basophils Absolute: 0 10*3/uL (ref 0.0–0.1)
EOS%: 6.3 % (ref 0.0–7.0)
Eosinophils Absolute: 0.4 10*3/uL (ref 0.0–0.5)
HEMATOCRIT: 41.8 % (ref 34.8–46.6)
HEMOGLOBIN: 13.4 g/dL (ref 11.6–15.9)
LYMPH%: 21.3 % (ref 14.0–49.7)
MCH: 30.9 pg (ref 25.1–34.0)
MCHC: 32.1 g/dL (ref 31.5–36.0)
MCV: 96.2 fL (ref 79.5–101.0)
MONO#: 0.4 10*3/uL (ref 0.1–0.9)
MONO%: 6.7 % (ref 0.0–14.0)
NEUT#: 4.3 10*3/uL (ref 1.5–6.5)
NEUT%: 65.3 % (ref 38.4–76.8)
PLATELETS: 294 10*3/uL (ref 145–400)
RBC: 4.35 10*6/uL (ref 3.70–5.45)
RDW: 17.6 % — AB (ref 11.2–14.5)
WBC: 6.6 10*3/uL (ref 3.9–10.3)
lymph#: 1.4 10*3/uL (ref 0.9–3.3)

## 2015-09-07 LAB — IRON AND TIBC
%SAT: 14 % — AB (ref 21–57)
IRON: 55 ug/dL (ref 41–142)
TIBC: 381 ug/dL (ref 236–444)
UIBC: 326 ug/dL (ref 120–384)

## 2015-09-07 LAB — FERRITIN: FERRITIN: 80 ng/mL (ref 9–269)

## 2015-09-07 NOTE — Progress Notes (Signed)
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 Colonoscopy showed a ulcerated completely obstructing large mass in the rectosigmoid colon, 15 cm from anus. Diverticulosis in the rectosigmoid colon. Biopsy was taken from the mass.   12/29/2014 Imaging CT chest, abdomen and pelvis showed a circumferential mass lesion in the proximal right colon above the ileocecal valve consistent with carcinoma of the colon. No adenopathy. No distant metastasis.   02/15/2015 Initial Diagnosis Cancer of right colon ([place])   02/15/2015 Surgery Laparoscopic right colectomy and sigmoidectomy   02/15/2015 Pathology Results Right colon segmental resection showed a invasive poorly differentiated adenocarcinoma, spanning 8 cm, T3, margins were negative, 16 lymph nodes. No lymphovascular invasion, no perineural invasion. MMR showed loss of expression of ML H1 and PMS2, MSI-H   02/15/2015 Pathology Results Sigmoid colon segmental resection showed invasive well differentiated adenocarcinoma, 5.5 cm, T3, 10 lymph nodes were negative, (+) tumor deposit. 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 female is here because of her recently diagnosed colon cancer. She is accompanied by her husband to our multidisciplinary GI clinic today.  She presented with fatigue and dizzines in 10/2014, she also had decreased appetite, mild intermittent blood in stool, moderate constipation, and weight loss about 30lbs. She was seen by her primary care physician, was found to be anemic, and required blood transfusion in October 2016. She was referred to gastroenterologist Dr. Oletta Lamas, and underwent EGD and colonoscopy on 12/21/2014, which showed a ulcerated mass in the sigmoid-rectum, 15 cm from anal verge, in the scope was not able to advance. Multiple biopsy from the sigmoid colon mass showed invasive adenocarcinoma. CT of the chest abdomen and pelvis was obtained, which showed a circumferential mass lesion in the proximal right colon above the ileocecal valve. She was referred to colorectal surgeon Dr. Marcello Moores, and underwent left scopic right colectomy and sigmoidectomy on 02/15/2015. She was discharged home 5 days after her surgery.  She has revoered very well from her surgery. She has greast appetite, eats well, normal BM, takes miralax as needed, no pain gained 6-7 lbs weight back since the surgery. She has good energy level, and remains to be physically active at home.  She has no children, no family history of colon cancer or other malignancy except her father had brain tumor.  CURRENT THERAPY: Xeloda '1500mg'$  bid,  2 weeks on, 1 week off, started on 04/04/2015, plan for 8 cycles   INTERIM HISTORY:  Olivia Jackson returns for follow-up. She has completed cycle 7 Xeloda last week. 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 She denies any significant discomfort, nausea, or other symptoms. She remains to be physically active, no other complaints.  MEDICAL HISTORY:  Past Medical History  Diagnosis Date  . Hypertension   . Falls     pt states fell twice this week   . History of blood transfusion     12/2014  . Anemia     SURGICAL HISTORY: Past Surgical History  Procedure Laterality Date  . Dilation and curettage of uterus    . Colonscopy     . Upper gi endoscopy    . Laparoscopic partial colectomy N/A 02/15/2015    Procedure: LAPAROSCOPIC RIGHT COLECTOMY AND SIGMOIDECTOMY PROCTOSCOPY;  Surgeon: Leighton Ruff, MD;  Location: [facility];  Service: General;  Laterality: N/A;  . Upper gi endoscopy  02/15/2015    Procedure: UPPER GI ENDOSCOPY;  Surgeon: Leighton Ruff, MD;  Location: [facility];  Service: General;;    SOCIAL HISTORY: Social History   Social History  . Marital Status: Married    Spouse Name: N/A  . Number of Children: N/A  . Years of Education: N/A   Occupational History  . Not on file.   Social History Main Topics  . Smoking status: Former Smoker -- 0.50 packs/day for 20 years    Types: Cigarettes    Quit date: 03/04/1995  . Smokeless tobacco: Never Used  . Alcohol Use: No  . Drug Use: No  . Sexual Activity: Not on file   Other Topics Concern  . Not on file   Social History Narrative   Married, husband [name]   No children   Independent ADLs, drives    FAMILY HISTORY: Family History  Problem Relation Age of Onset  . Cancer Father 63    brain tumor     ALLERGIES:  has No Known Allergies.  MEDICATIONS:  Current Outpatient Prescriptions  Medication Sig Dispense Refill  . acetaminophen (TYLENOL) 325 MG tablet Take 2 tablets (650 mg total) by mouth every 6 (six) hours as needed.    . capecitabine (XELODA) 500 MG tablet Take 3 tablets (1,500 mg total) by mouth 2 (two) times daily  after a meal. Takes for 14 days,  Off  7 days. 84 tablet 3  . ferrous sulfate 325 (65 FE) MG tablet Take 325 mg by mouth 2 (two) times daily.  3  . polyethylene glycol (MIRALAX / GLYCOLAX) packet Take 17 g by mouth daily as needed for mild constipation, moderate constipation or severe constipation.    . urea (CARMOL) 10 % cream Apply topically as needed. 71 g 1   No current facility-administered medications for this visit.    REVIEW OF SYSTEMS:   Constitutional: Denies fevers, chills or abnormal night sweats Eyes: Denies blurriness of vision, double vision or watery eyes Ears, nose, mouth, throat, and face: Denies mucositis or sore throat Respiratory: Denies cough, dyspnea or wheezes Cardiovascular: Denies palpitation, chest discomfort or lower extremity swelling Gastrointestinal:  Denies nausea, heartburn or change in bowel habits Skin: Denies abnormal skin rashes Lymphatics: Denies new lymphadenopathy or easy bruising Neurological:Denies numbness, tingling or new weaknesses Behavioral/Psych: Mood is stable, no new changes  All other systems were reviewed with  the patient and are negative.  PHYSICAL EXAMINATION: ECOG PERFORMANCE STATUS: 0  Filed Vitals:   09/07/15 0831  BP: 113/72  Pulse: 80  Temp: 98 F (36.7 C)  Resp: 18   Filed Weights   09/07/15 0831  Weight: 141 lb (63.957 kg)    GENERAL:alert, no distress and comfortable SKIN: skin color, texture, turgor are normal, no rashes or significant lesions, (+)  Skin pigmentations on bost hands, especially on the palm, with small area of skin peeling,  No cracks or ulceration EYES: normal, conjunctiva are pink and non-injected, sclera clear OROPHARYNX:no exudate, no erythema and lips, buccal mucosa, and tongue normal  NECK: supple, thyroid normal size, non-tender, without nodularity LYMPH:  no palpable lymphadenopathy in the cervical, axillary or inguinal LUNGS: clear to auscultation and percussion with normal breathing  effort HEART: regular rate & rhythm and no murmurs and no lower extremity edema ABDOMEN:abdomen soft, non-tender and normal bowel sounds. Midline surgical scar and laparoscopic surgical scars are all well healed, no skin erythema or discharge. Musculoskeletal:no cyanosis of digits and no clubbing  PSYCH: alert & oriented x 3 with fluent speech NEURO: no focal motor/sensory deficits  LABORATORY DATA:  I have reviewed the data as listed CBC Latest Ref Rng 09/07/2015 08/13/2015 07/18/2015  WBC 3.9 - 10.3 10e3/uL 6.6 7.4 7.2  Hemoglobin 11.6 - 15.9 g/dL 13.4 12.9 12.8  Hematocrit 34.8 - 46.6 % 41.8 40.5 39.2  Platelets 145 - 400 10e3/uL 294 284 305     Recent Labs  02/16/15 0433 02/17/15 0513 02/18/15 0543  06/28/15 0936 07/18/15 1250 08/13/15 1427  NA 136 136 137  < > 142 142 141  K 3.2* 3.6 3.9  < > 3.5 3.6 3.9  CL 101 103 103  --   --   --   --   CO2 _0 < > _1 GLUCOSE 99 93 95  < > 98 79 76  BUN 9 7 <5*  < > 11.1 10.4 12.0  CREATININE 0.82 0.65 0.64  < > 0.8 0.8 0.8  CALCIUM 8.5* 8.2* 8.3*  < > 9.3 9.1 9.5  GFRNONAA >60 >60 >60  --   --   --   --   GFRAA >60 >60 >60  --   --   --   --   PROT  --   --   --   < > 7.0 7.3 7.4  ALBUMIN  --   --   --   < > 3.5 3.7 3.8  AST  --   --   --   < > _2 ALT  --   --   --   < > _3 ALKPHOS  --   --   --   < > 48 48 53  BILITOT  --   --   --   < > 0.60 0.78 1.21*  < > = values in this interval not displayed.  PATHOLOGY REPORT  Diagnosis 02/15/2015 1. Colon, segmental resection for tumor, right - INVASIVE POORLY DIFFERENTIATED ADENOCARCINOMA, SPANNING 8 CM IN GREATEST DIMENSION. - TUMOR INVADES THROUGH MUSCULARIS PROPRIA TO INVOLVE SUBSEROSAL SOFT TISSUES. - MARGIN IS NEGATIVE. - SIXTEEN BENIGN LYMPH NODES WITH NO TUMOR SEEN (0/16). - SEE ONCOLOGY TEMPLATE. 2. Colon, segmental resection for tumor, sigmoid - INVASIVE WELL DIFFERENTIATED ADENOCARCINOMA WITH ABUNDANT EXTRACELLULAR MUCIN, SPANNING 5.5 CM IN  GREATEST DIMENSION. - TUMOR INVADES THROUGH MUSCULARIS PROPRIA TO INVOLVE SUBSEROSAL SOFT TISSUES. -  MARGINS ARE NEGATIVE. - SATELLITE TUMOR DEPOSIT (DISCONTINUOUS EXTRAMURAL EXTENSION) IS PRESENT. - TEN BENIGN LYMPH NODES WITH NO TUMOR SEEN (0/10). - SEE ONCOLOGY TEMPLATE BELOW. 3. Colon, resection margin (donut), distal anastomotic ring - BENIGN COLORECTAL MUCOSA. - NO TUMOR SEEN.  Microscopic Comment 1. COLON AND RECTUM: Specimen: Right colon. Procedure: Right hemicolectomy. Tumor site: Ascending colon. Specimen integrity: Intact. Macroscopic tumor perforation: Not identified. Invasive tumor: Maximum size: 8 cm. Histologic type(s): Adenocarcinoma. Histologic grade and differentiation: G3: poorly differentiated/high grade. Type of polyp in which invasive carcinoma arose: A definitive precursor polyp is not identified. Microscopic extension of invasive tumor: Tumor invades through muscularis propria to involve subserosal soft tissues. Lymph-Vascular invasion: Definitive lymph/vascular invasion is not identified. Peri-neural invasion: Not identified. Tumor deposit(s) (discontinuous extramural extension): No tumor deposits showing a high grade poorly differentiated adenocarcinoma are identified. Resection margins: Proximal margin: Negative. Distal margin: Negative. Circumferential (radial) (posterior ascending, posterior descending; lateral and posterior mid-rectum; and entire lower 1/3 rectum): Negative. Distance closest margin (if all above margins negative): 4.5 cm (radial margin). Treatment effect (neo-adjuvant therapy): Not applicable. Additional polyp(s): Three additional tubular adenomas are present in the right colon specimen. Non-neoplastic findings: Benign unremarkable appendix. Lymph nodes: number examined: 16 (from the right colon specimen); number positive: 0. Pathologic Staging: pT3, pN0. Ancillary studies: The tumor will be sent for MMR by [place] and MSI by PCR. 2.  COLON AND RECTUM: Specimen: Right partial colon (sigmoid colon). Procedure: Right partial colectomy (sigmoid resection). Tumor site: Sigmoid colon (distal aspect of the specimen along the posterior aspect). Specimen integrity: Intact. Macroscopic tumor perforation: Not identified. Invasive tumor: Maximum size: 5.5 cm. Histologic type(s): Adenocarcinoma with abundant extracellular mucin. Histologic grade and differentiation: G1: well differentiated/low grade. Type of polyp in which invasive carcinoma arose: Tumor arose from a tubular adenoma with high grade glandular dysplasia. Microscopic extension of invasive tumor: Tumor invades through muscularis propria to involve subserosal soft tissue. Lymph-Vascular invasion: Definitive lymph/vascular invasion is not identified, however there is a tumor deposit present, see below. Peri-neural invasion: Not identified. Tumor deposit(s) (discontinuous extramural extension): Yes, tumor deposit is present. Resection margins: Proximal margin: Negative. Distal margin: Negative. Circumferential (radial) (posterior ascending, posterior descending; lateral and posterior mid-rectum; and entire lower 1/3 rectum): 3.1 cm. Distance closest margin (if all above margins negative): 2.4 cm (distal margin). Treatment effect (neo-adjuvant therapy): Not applicable. Additional polyp(s): Benign polypoid colorectal mucosa with hyperplastic-type change (possibly representing early hyperplastic polyp formation) is present. Non-neoplastic findings: No additional non-neoplastic findings Lymph nodes: number examined: 10 (from the sigmoid colon specimen); number positive: 0, see comment. Pathologic Staging: pT3, pN1c. Ancillary studies: The tumor will be sent for MMR by IHC and MSI by PCR per colorectal cancer protocol. Comment: Initially, only 7 lymph nodes are identified in the sigmoid colon specimen. The specimen is placed in fat clearing solution with an additional  three lymph nodes identified on second gross assessment. (RH:kh 02-19-15)  Mismatch Repair (MMR) Protein Immunohistochemistry (IHC) IHC Expression Result: MLH1: LOSS OF NUCLEAR EXPRESSION (LESS THAN 5% TUMOR EXPRESSION) MSH2: Preserved nuclear expression (greater 50% tumor expression) MSH6: Preserved nuclear expression (greater 50% tumor expression) PMS2: LOSS OF NUCLEAR EXPRESSION (LESS THAN 5% TUMOR EXPRESSION) * Internal control demonstrates intact nuclear expression Interpretation: ABNORMAL There is loss of the major and minor MMR proteins MLH1 and PMS2. The loss of expression may be secondary to promoter hyper-methylation, gene mutation or other genetic event. BRAF mutation testing and/or MLH1 methylation testing is indicated. The presence of a BRAF mutation and/or MLH1  hyper-methylation is indicative of a sporadic-type tumor. The absence of either BRAF mutation and/or presence of normal-methylation indicate the possible presence of a hereditary germline mutation (e.g. Lynch syndrome) and referral to genetic counseling is warranted. It is recommended that the loss of protein expression be correlated with molecular based MSI testing.  2. Mismatch Repair (MMR) Protein Immunohistochemistry (IHC) IHC Expression Result: MLH1: Preserved nuclear expression (greater 50% tumor expression) MSH2: Preserved nuclear expression (greater 50% tumor expression) MSH6: Preserved nuclear expression (greater 50% tumor expression) PMS2: Preserved nuclear expression (greater 50% tumor expression) * Internal control demonstrates intact nuclear expression Interpretation: NORMAL       RADIOGRAPHIC STUDIES: I have personally reviewed the radiological images as listed and agreed with the findings in the report.  CT chest wo contrast 12/29/2014  IMPRESSION: No definite evidence of metastases. There are 2 nonenlarged but questionable right axillary lymph nodes.  CT abdomen and pelvis w contrast  12/29/2014  IMPRESSION: Circumferential mass lesion in the proximal right colon above the ileocecal valve consistent with carcinoma of the colon. There is stranding in the pericolonic fat which could be due to tumor extension. No adenopathy.  Constipation without bowel obstruction.  These results will be called to the ordering clinician or representative by the Radiologist Assistant, and communication documented in the PACS or zVision Dashboard.   ASSESSMENT & PLAN: 75 year old African-American female with past medical history of hypertension, presented with fatigue, anemia and dizziness.  1. Right colon cancer, pT3N0M0 stage IIA, poorly differentiated,MSI-high, and sigmoid-rectal colon cancer, pT3N1cM0 stage IIIB, well differentiated,MSI-stable  -I reviewed her surgical pathology findings with patient and her husband in great details. She has 2 multifocal colon adenocarcinoma was distinct features. Her right colon cancer is stage IIA, poorly differentiated, MSI high, has P ref mutation and MLH-1 hypervascular mass lesion, which supports a sporadic colon cancer, unlikely Lynch syndrome. 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 -Her staging CT scan was negative for distant metastasis. Her pre-op CEA was elevated at 7.5, post op CEA came down to 1.7 (normal) -She has started adjuvant chemotherapy Xeloda, tolerating very well, no complaints except skin issue on her palms  -Lab results reviewed with her, CBC and CMP are unremarkable, she will start 8th (last) cycle of Xeloda on 7/8  -We discussed colon cancer surveillance after she completes adjuvant chemotherapy. I recommend routine office follow-up with lab and exam every 3-4 months for the first 2-3 years, then every 6 months, for total of 5 years. I'll obtain a surveillance CT scan every 6-12 months -will obtain a  surveillance CT scan in 2 months   2.  Hypertension -Her blood pressure has been borderline low lately. 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 The patient knows to call the clinic with any problems, questions or concerns. I spent 15 minutes counseling the patient face to face. The total time spent in the appointment was  20 minutes and more than 50% was on counseling.     Truitt Merle, MD 09/07/2015

## 2015-09-07 NOTE — Telephone Encounter (Signed)
per pof to sch appt-gave pt copy of avs-contrast-adv central sch will call to sch scan

## 2015-09-08 LAB — CEA: CEA: 6.7 ng/mL — ABNORMAL HIGH (ref 0.0–4.7)

## 2015-10-17 ENCOUNTER — Telehealth: Payer: Self-pay | Admitting: *Deleted

## 2015-10-17 NOTE — Telephone Encounter (Signed)
  Oncology Nurse Navigator Documentation  Navigator Location: [facility] (10/17/15 1609) Navigator Encounter Type: Telephone (10/17/15 1609) Telephone: Incoming Call;Appt Confirmation/Clarification (10/17/15 1609)   Patient call to ask if she can drive herself for labs/CT scan on 10/26/15. Confirmed there is no reasons she can't drive herself to the appointment that day, she will not have any sedation.

## 2015-10-26 ENCOUNTER — Ambulatory Visit (HOSPITAL_COMMUNITY)
Admission: RE | Admit: 2015-10-26 | Discharge: 2015-10-26 | Disposition: A | Discharge status: Home / Self Care | Payer: Medicare Other | Source: Ambulatory Visit | Attending: Hematology | Admitting: Hematology

## 2015-10-26 ENCOUNTER — Encounter (HOSPITAL_COMMUNITY): Payer: Self-pay

## 2015-10-26 ENCOUNTER — Other Ambulatory Visit (HOSPITAL_BASED_OUTPATIENT_CLINIC_OR_DEPARTMENT_OTHER): Payer: Medicare Other

## 2015-10-26 DIAGNOSIS — E278 Other specified disorders of adrenal gland: Secondary | ICD-10-CM | POA: Insufficient documentation

## 2015-10-26 DIAGNOSIS — C187 Malignant neoplasm of sigmoid colon: Secondary | ICD-10-CM

## 2015-10-26 DIAGNOSIS — Z9049 Acquired absence of other specified parts of digestive tract: Secondary | ICD-10-CM | POA: Insufficient documentation

## 2015-10-26 DIAGNOSIS — C772 Secondary and unspecified malignant neoplasm of intra-abdominal lymph nodes: Secondary | ICD-10-CM | POA: Insufficient documentation

## 2015-10-26 HISTORY — DX: Malignant (primary) neoplasm, unspecified: C80.1

## 2015-10-26 LAB — CBC WITH DIFFERENTIAL/PLATELET
BASO%: 0.5 % (ref 0.0–2.0)
Basophils Absolute: 0 10*3/uL (ref 0.0–0.1)
EOS%: 4.8 % (ref 0.0–7.0)
Eosinophils Absolute: 0.5 10*3/uL (ref 0.0–0.5)
HCT: 43.7 % (ref 34.8–46.6)
HEMOGLOBIN: 14.1 g/dL (ref 11.6–15.9)
LYMPH%: 16.2 % (ref 14.0–49.7)
MCH: 30.1 pg (ref 25.1–34.0)
MCHC: 32.2 g/dL (ref 31.5–36.0)
MCV: 93.6 fL (ref 79.5–101.0)
MONO#: 0.6 10*3/uL (ref 0.1–0.9)
MONO%: 5.9 % (ref 0.0–14.0)
NEUT%: 72.6 % (ref 38.4–76.8)
NEUTROS ABS: 7.1 10*3/uL — AB (ref 1.5–6.5)
Platelets: 328 10*3/uL (ref 145–400)
RBC: 4.67 10*6/uL (ref 3.70–5.45)
RDW: 15.7 % — AB (ref 11.2–14.5)
WBC: 9.8 10*3/uL (ref 3.9–10.3)
lymph#: 1.6 10*3/uL (ref 0.9–3.3)

## 2015-10-26 LAB — COMPREHENSIVE METABOLIC PANEL
ALBUMIN: 3.8 g/dL (ref 3.5–5.0)
ALK PHOS: 67 U/L (ref 40–150)
ALT: 14 U/L (ref 0–55)
AST: 21 U/L (ref 5–34)
Anion Gap: 14 mEq/L — ABNORMAL HIGH (ref 3–11)
BUN: 9.4 mg/dL (ref 7.0–26.0)
CO2: 24 meq/L (ref 22–29)
Calcium: 9.5 mg/dL (ref 8.4–10.4)
Chloride: 104 mEq/L (ref 98–109)
Creatinine: 0.8 mg/dL (ref 0.6–1.1)
EGFR: 80 mL/min/{1.73_m2} — AB (ref >90)
GLUCOSE: 101 mg/dL (ref 70–140)
POTASSIUM: 3.5 meq/L (ref 3.5–5.1)
SODIUM: 142 meq/L (ref 136–145)
TOTAL PROTEIN: 8 g/dL (ref 6.4–8.3)
Total Bilirubin: 0.46 mg/dL (ref 0.20–1.20)

## 2015-10-26 MED ORDER — IOPAMIDOL (ISOVUE-300) INJECTION 61%
100.0000 mL | Freq: Once | INTRAVENOUS | Status: AC | PRN
  Administered 2015-10-26: 100 mL via INTRAVENOUS

## 2015-11-02 ENCOUNTER — Telehealth: Payer: Self-pay | Admitting: Hematology

## 2015-11-02 ENCOUNTER — Encounter: Payer: Self-pay | Admitting: Hematology

## 2015-11-02 ENCOUNTER — Other Ambulatory Visit: Payer: Medicare Other

## 2015-11-02 ENCOUNTER — Ambulatory Visit (HOSPITAL_BASED_OUTPATIENT_CLINIC_OR_DEPARTMENT_OTHER): Payer: Medicare Other | Admitting: Hematology

## 2015-11-02 VITALS — BP 108/80 | HR 92 | Temp 98.0°F | Resp 17 | Ht 62.0 in | Wt 150.8 lb

## 2015-11-02 DIAGNOSIS — C182 Malignant neoplasm of ascending colon: Secondary | ICD-10-CM

## 2015-11-02 DIAGNOSIS — C772 Secondary and unspecified malignant neoplasm of intra-abdominal lymph nodes: Secondary | ICD-10-CM

## 2015-11-02 DIAGNOSIS — D509 Iron deficiency anemia, unspecified: Secondary | ICD-10-CM

## 2015-11-02 DIAGNOSIS — C187 Malignant neoplasm of sigmoid colon: Secondary | ICD-10-CM

## 2015-11-02 DIAGNOSIS — I10 Essential (primary) hypertension: Secondary | ICD-10-CM

## 2015-11-02 NOTE — Telephone Encounter (Signed)
Gave patient avs report and appointments December  °

## 2015-11-02 NOTE — Progress Notes (Signed)
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 Colonoscopy showed a ulcerated completely obstructing large mass in the rectosigmoid colon, 15 cm from anus. Diverticulosis in the rectosigmoid colon. Biopsy was taken from the mass.      12/29/2014 Imaging    CT chest, abdomen and pelvis showed a circumferential mass lesion in the proximal right colon above the ileocecal valve consistent with carcinoma of the colon. No adenopathy. No distant metastasis.      02/15/2015 Initial Diagnosis    Cancer of right colon ([place])      02/15/2015 Surgery    Laparoscopic right colectomy and sigmoidectomy      02/15/2015 Pathology Results    Right colon segmental resection showed a invasive poorly differentiated adenocarcinoma, spanning 8 cm, T3, margins were negative, 16 lymph nodes. No lymphovascular invasion, no perineural invasion. MMR showed loss of expression of ML H1 and PMS2, MSI-H      02/15/2015 Pathology Results    Sigmoid colon segmental resection showed invasive well differentiated adenocarcinoma, 5.5 cm, T3, 10 lymph nodes were negative, (+) tumor deposit. margins were negative       04/04/2015 - 09/21/2015 Chemotherapy    Adjuvant Xeloda 1500 mg bid 14 days on-7 days off, s/p 8 cycles       10/26/2015 Imaging    Surveillance CT chest, abdomen and pelvis with contrast showed no evidence of recurrence, 13 mm right adrenal nodule, unchanged since 2016, favor benign adrenal adenoma.       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 female is here because of her recently diagnosed colon cancer. She is accompanied by her husband to our multidisciplinary GI clinic today.  She presented with fatigue and dizzines in 10/2014, she also had decreased appetite, mild intermittent blood in stool, moderate constipation, and weight loss about 30lbs. She was seen by her primary care physician, was found to be anemic, and required blood transfusion in October 2016. She was referred to gastroenterologist Dr. Oletta Lamas, and underwent EGD and colonoscopy on 12/21/2014, which showed a ulcerated mass in the sigmoid-rectum, 15 cm from anal verge, in the scope was not able to advance. Multiple biopsy from the sigmoid colon mass showed invasive adenocarcinoma. CT of the chest abdomen and pelvis was obtained, which showed a circumferential mass lesion in the proximal right colon above the ileocecal valve. She was referred to colorectal surgeon Dr. Marcello Moores, and underwent  left scopic right colectomy and sigmoidectomy on 02/15/2015. She was discharged home 5 days after her surgery.  She has revoered very well from her surgery. She has greast appetite, eats well, normal BM, takes miralax as needed, no pain gained 6-7 lbs weight back since the surgery. She has good energy level, and remains to be physically active at home.  She has no children, no family history of colon cancer or other malignancy except her  father had brain tumor.  CURRENT THERAPY: Observation  INTERIM HISTORY:  Olivia Jackson returns for follow-up. She has completed last cycle of Xeloda on July 21. She has been feeling better since she completed chemotherapy. She has more energy, and better appetite. She has gained about 10 pounds in the past few months. He denies any pain or other symptoms, feels very energetic, remains very physically active. No other complaints  MEDICAL HISTORY:  Past Medical History:  Diagnosis Date  . Anemia   . Cancer ([place]) 2016   colon ca  . Falls    pt states fell twice this week   . History of blood transfusion    12/2014  . Hypertension     SURGICAL HISTORY: Past Surgical History:  Procedure Laterality Date  . colonscopy     . DILATION AND CURETTAGE OF UTERUS    . LAPAROSCOPIC PARTIAL COLECTOMY N/A 02/15/2015   Procedure: LAPAROSCOPIC RIGHT COLECTOMY AND SIGMOIDECTOMY PROCTOSCOPY;  Surgeon: Leighton Ruff, MD;  Location: [facility];  Service: General;  Laterality: N/A;  . UPPER GI ENDOSCOPY    . UPPER GI ENDOSCOPY  02/15/2015   Procedure: UPPER GI ENDOSCOPY;  Surgeon: Leighton Ruff, MD;  Location: [facility];  Service: General;;    SOCIAL HISTORY: Social History   Social History  . Marital status: Married    Spouse name: N/A  . Number of children: N/A  . Years of education: N/A   Occupational History  . Not on file.   Social History Main Topics  . Smoking status: Former Smoker    Packs/day: 0.50    Years: 20.00    Types: Cigarettes    Quit date: 03/04/1995  . Smokeless tobacco: Never Used  . Alcohol use No  . Drug use: No  . Sexual activity: Not on file   Other Topics Concern  . Not on file   Social History Narrative   Married, husband Olivia Jackson   No children   Independent ADLs, drives    FAMILY HISTORY: Family History  Problem Relation Age of Onset  . Cancer Father 9    brain tumor     ALLERGIES:  has No Known Allergies.  MEDICATIONS:  Current Outpatient Prescriptions    Medication Sig Dispense Refill  . acetaminophen (TYLENOL) 325 MG tablet Take 2 tablets (650 mg total) by mouth every 6 (six) hours as needed.    . capecitabine (XELODA) 500 MG tablet Take 3 tablets (1,500 mg total) by mouth 2 (two) times daily after a meal. Takes for 14 days,  Off  7 days. 84 tablet 3  . ferrous sulfate 325 (65 FE) MG tablet Take 325 mg by mouth 2 (two) times daily.  3  . polyethylene glycol (MIRALAX / GLYCOLAX) packet Take 17 g by mouth daily as needed for mild constipation, moderate constipation or severe constipation.    . urea (CARMOL) 10 % cream Apply topically as needed. 71 g 1   No current facility-administered medications for this visit.     REVIEW OF SYSTEMS:   Constitutional: Denies fevers,  chills or abnormal night sweats Eyes: Denies blurriness of vision, double vision or watery eyes Ears, nose, mouth, throat, and face: Denies mucositis or sore throat Respiratory: Denies cough, dyspnea or wheezes Cardiovascular: Denies palpitation, chest discomfort or lower extremity swelling Gastrointestinal:  Denies nausea, heartburn or change in bowel habits Skin: Denies abnormal skin rashes Lymphatics: Denies new lymphadenopathy or easy bruising Neurological:Denies numbness, tingling or new weaknesses Behavioral/Psych: Mood is stable, no new changes  All other systems were reviewed with the patient and are negative.  PHYSICAL EXAMINATION: ECOG PERFORMANCE STATUS: 0  Vitals:   11/02/15 0855  BP: 108/80  Pulse: 92  Resp: 17  Temp: 98 F (36.7 C)   Filed Weights   11/02/15 0855  Weight: 150 lb 12.8 oz (68.4 kg)    GENERAL:alert, no distress and comfortable SKIN: skin color, texture, turgor are normal, no rashes or significant lesions, (+)  Skin pigmentations on both hands has near resolved  EYES: normal, conjunctiva are pink and non-injected, sclera clear OROPHARYNX:no exudate, no erythema and lips, buccal mucosa, and tongue normal  NECK: supple, thyroid  normal size, non-tender, without nodularity LYMPH:  no palpable lymphadenopathy in the cervical, axillary or inguinal LUNGS: clear to auscultation and percussion with normal breathing effort HEART: regular rate & rhythm and no murmurs and no lower extremity edema ABDOMEN:abdomen soft, non-tender and normal bowel sounds. Midline surgical scar and laparoscopic surgical scars are all well healed, no skin erythema or discharge. Musculoskeletal:no cyanosis of digits and no clubbing  PSYCH: alert & oriented x 3 with fluent speech NEURO: no focal motor/sensory deficits  LABORATORY DATA:  I have reviewed the data as listed CBC Latest Ref Rng & Units 10/26/2015 09/07/2015 08/13/2015  WBC 3.9 - 10.3 10e3/uL 9.8 6.6 7.4  Hemoglobin 11.6 - 15.9 g/dL 14.1 13.4 12.9  Hematocrit 34.8 - 46.6 % 43.7 41.8 40.5  Platelets 145 - 400 10e3/uL 328 294 284     Recent Labs  02/16/15 0433 02/17/15 0513 02/18/15 0543  08/13/15 1427 09/07/15 0813 10/26/15 0852  NA 136 136 137  < > 141 142 142  K 3.2* 3.6 3.9  < > 3.9 3.8 3.5  CL 101 103 103  --   --   --   --   CO2 _0 < > _1 GLUCOSE 99 93 95  < > 76 86 101  BUN 9 7 <5*  < > 12.0 7.0 9.4  CREATININE 0.82 0.65 0.64  < > 0.8 0.8 0.8  CALCIUM 8.5* 8.2* 8.3*  < > 9.5 9.3 9.5  GFRNONAA >60 >60 >60  --   --   --   --   GFRAA >60 >60 >60  --   --   --   --   PROT  --   --   --   < > 7.4 7.4 8.0  ALBUMIN  --   --   --   < > 3.8 3.7 3.8  AST  --   --   --   < > _2 ALT  --   --   --   < > _3 ALKPHOS  --   --   --   < > 53 61 67  BILITOT  --   --   --   < > 1.21* 0.72 0.46  < > = values in this interval not displayed.  PATHOLOGY REPORT  Diagnosis 02/15/2015 1. Colon, segmental resection for tumor,  right - INVASIVE POORLY DIFFERENTIATED ADENOCARCINOMA, SPANNING 8 CM IN GREATEST DIMENSION. - TUMOR INVADES THROUGH MUSCULARIS PROPRIA TO INVOLVE SUBSEROSAL SOFT TISSUES. - MARGIN IS NEGATIVE. - SIXTEEN BENIGN LYMPH NODES WITH NO TUMOR  SEEN (0/16). - SEE ONCOLOGY TEMPLATE. 2. Colon, segmental resection for tumor, sigmoid - INVASIVE WELL DIFFERENTIATED ADENOCARCINOMA WITH ABUNDANT EXTRACELLULAR MUCIN, SPANNING 5.5 CM IN GREATEST DIMENSION. - TUMOR INVADES THROUGH MUSCULARIS PROPRIA TO INVOLVE SUBSEROSAL SOFT TISSUES. - MARGINS ARE NEGATIVE. - SATELLITE TUMOR DEPOSIT (DISCONTINUOUS EXTRAMURAL EXTENSION) IS PRESENT. - TEN BENIGN LYMPH NODES WITH NO TUMOR SEEN (0/10). - SEE ONCOLOGY TEMPLATE BELOW. 3. Colon, resection margin (donut), distal anastomotic ring - BENIGN COLORECTAL MUCOSA. - NO TUMOR SEEN.  Microscopic Comment 1. COLON AND RECTUM: Specimen: Right colon. Procedure: Right hemicolectomy. Tumor site: Ascending colon. Specimen integrity: Intact. Macroscopic tumor perforation: Not identified. Invasive tumor: Maximum size: 8 cm. Histologic type(s): Adenocarcinoma. Histologic grade and differentiation: G3: poorly differentiated/high grade. Type of polyp in which invasive carcinoma arose: A definitive precursor polyp is not identified. Microscopic extension of invasive tumor: Tumor invades through muscularis propria to involve subserosal soft tissues. Lymph-Vascular invasion: Definitive lymph/vascular invasion is not identified. Peri-neural invasion: Not identified. Tumor deposit(s) (discontinuous extramural extension): No tumor deposits showing a high grade poorly differentiated adenocarcinoma are identified. Resection margins: Proximal margin: Negative. Distal margin: Negative. Circumferential (radial) (posterior ascending, posterior descending; lateral and posterior mid-rectum; and entire lower 1/3 rectum): Negative. Distance closest margin (if all above margins negative): 4.5 cm (radial margin). Treatment effect (neo-adjuvant therapy): Not applicable. Additional polyp(s): Three additional tubular adenomas are present in the right colon specimen. Non-neoplastic findings: Benign unremarkable  appendix. Lymph nodes: number examined: 16 (from the right colon specimen); number positive: 0. Pathologic Staging: pT3, pN0. Ancillary studies: The tumor will be sent for MMR by [facility] and MSI by PCR. 2. COLON AND RECTUM: Specimen: Right partial colon (sigmoid colon). Procedure: Right partial colectomy (sigmoid resection). Tumor site: Sigmoid colon (distal aspect of the specimen along the posterior aspect). Specimen integrity: Intact. Macroscopic tumor perforation: Not identified. Invasive tumor: Maximum size: 5.5 cm. Histologic type(s): Adenocarcinoma with abundant extracellular mucin. Histologic grade and differentiation: G1: well differentiated/low grade. Type of polyp in which invasive carcinoma arose: Tumor arose from a tubular adenoma with high grade glandular dysplasia. Microscopic extension of invasive tumor: Tumor invades through muscularis propria to involve subserosal soft tissue. Lymph-Vascular invasion: Definitive lymph/vascular invasion is not identified, however there is a tumor deposit present, see below. Peri-neural invasion: Not identified. Tumor deposit(s) (discontinuous extramural extension): Yes, tumor deposit is present. Resection margins: Proximal margin: Negative. Distal margin: Negative. Circumferential (radial) (posterior ascending, posterior descending; lateral and posterior mid-rectum; and entire lower 1/3 rectum): 3.1 cm. Distance closest margin (if all above margins negative): 2.4 cm (distal margin). Treatment effect (neo-adjuvant therapy): Not applicable. Additional polyp(s): Benign polypoid colorectal mucosa with hyperplastic-type change (possibly representing early hyperplastic polyp formation) is present. Non-neoplastic findings: No additional non-neoplastic findings Lymph nodes: number examined: 10 (from the sigmoid colon specimen); number positive: 0, see comment. Pathologic Staging: pT3, pN1c. Ancillary studies: The tumor will be sent for MMR by  [facility] and MSI by PCR per colorectal cancer protocol. Comment: Initially, only 7 lymph nodes are identified in the sigmoid colon specimen. The specimen is placed in fat clearing solution with an additional three lymph nodes identified on second gross assessment. (RH:kh 02-19-15)  Mismatch Repair (MMR) Protein Immunohistochemistry (IHC) IHC Expression Result: MLH1: LOSS OF NUCLEAR EXPRESSION (LESS THAN 5% TUMOR EXPRESSION) MSH2: Preserved nuclear expression (greater  50% tumor expression) MSH6: Preserved nuclear expression (greater 50% tumor expression) PMS2: LOSS OF NUCLEAR EXPRESSION (LESS THAN 5% TUMOR EXPRESSION) * Internal control demonstrates intact nuclear expression Interpretation: ABNORMAL There is loss of the major and minor MMR proteins MLH1 and PMS2. The loss of expression may be secondary to promoter hyper-methylation, gene mutation or other genetic event. BRAF mutation testing and/or MLH1 methylation testing is indicated. The presence of a BRAF mutation and/or MLH1 hyper-methylation is indicative of a sporadic-type tumor. The absence of either BRAF mutation and/or presence of normal-methylation indicate the possible presence of a hereditary germline mutation (e.g. Lynch syndrome) and referral to genetic counseling is warranted. It is recommended that the loss of protein expression be correlated with molecular based MSI testing.  2. Mismatch Repair (MMR) Protein Immunohistochemistry (IHC) IHC Expression Result: MLH1: Preserved nuclear expression (greater 50% tumor expression) MSH2: Preserved nuclear expression (greater 50% tumor expression) MSH6: Preserved nuclear expression (greater 50% tumor expression) PMS2: Preserved nuclear expression (greater 50% tumor expression) * Internal control demonstrates intact nuclear expression Interpretation: NORMAL       RADIOGRAPHIC STUDIES: I have personally reviewed the radiological images as listed and agreed with the findings in the  report.   CT chest, abdomen and pelvis w contrast  10/26/2015 IMPRESSION: Status post right hemicolectomy and sigmoidectomy.  No findings suspicious for recurrent or metastatic disease.  13 mm right adrenal nodule, technically indeterminate, although unchanged since 2016 and favored to reflect a benign adrenal adenoma.  Additional ancillary findings as above.   ASSESSMENT & PLAN: 75 year old African-American female with past medical history of hypertension, presented with fatigue, anemia and dizziness.  1. Right colon cancer, pT3N0M0 stage IIA, poorly differentiated,MSI-high, and sigmoid-rectal colon cancer, pT3N1cM0 stage IIIB, well differentiated,MSI-stable  -I reviewed her surgical pathology findings with patient and her husband in great details. She has 2 multifocal colon adenocarcinoma was distinct features. Her right colon cancer is stage IIA, poorly differentiated, MSI high, has P ref mutation and MLH-1 hypervascular mass lesion, which supports a sporadic colon cancer, unlikely Lynch syndrome. 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 -Her staging CT scan was negative for distant metastasis. Her pre-op CEA was elevated at 7.5, post op CEA came down to 1.7 (normal) -She has completed adjuvant chemotherapy Xeloda, tolerated very well, no complaints except skin issue on her palms  -Lab results reviewed with her, CBC and CMP are unremarkable, exam was normal -I reviewed her surveillance CT scan from 10/22/2015, which showed no evidence of recurrence -We'll continue colon cancer surveillance. I recommend routine office follow-up with lab and exam every 3-4 months for the first 2-3 years, then every 6 months, for total of 5 years. I'll obtain a surveillance CT scan every 6-12 months -I encouraged her to continue healthy diet, and exercise regularly.  2. Hypertension -She'll continue follow-up with her primary  care physician -I encouraged her to drink more water, avoid dehydration  3. Hand and foot syndrome  - secondary to Xeloda, resolved now  Plan -Lab and CT scan reviewed -I will see her back in 3 months with lab  All questions were answered. The patient knows to call the clinic with any problems, questions or concerns. I spent 20 minutes counseling the patient face to face. The total time spent in the appointment was 2r minutes and more than 50% was on counseling.     Truitt Merle, MD 11/02/2015

## 2016-01-31 NOTE — Progress Notes (Signed)
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 Colonoscopy showed a ulcerated completely obstructing large mass in the rectosigmoid colon, 15 cm from anus. Diverticulosis in the rectosigmoid colon. Biopsy was taken from the mass.      12/29/2014 Imaging    CT chest, abdomen and pelvis showed a circumferential mass lesion in the proximal right colon above the ileocecal valve consistent with carcinoma of the colon. No adenopathy. No distant metastasis.      02/15/2015 Initial Diagnosis    Cancer of right colon ([place])      02/15/2015 Surgery    Laparoscopic right colectomy and sigmoidectomy      02/15/2015 Pathology Results    Right colon segmental resection showed a invasive poorly differentiated adenocarcinoma, spanning 8 cm, T3, margins were negative, 16 lymph nodes. No lymphovascular invasion, no perineural invasion. MMR showed loss of expression of ML H1 and PMS2, MSI-H      02/15/2015 Pathology Results    Sigmoid colon segmental resection showed invasive well differentiated adenocarcinoma, 5.5 cm, T3, 10 lymph nodes were negative, (+) tumor deposit. margins were negative       04/04/2015 - 09/21/2015 Chemotherapy    Adjuvant Xeloda 1500 mg bid 14 days on-7 days off, s/p 8 cycles       10/26/2015 Imaging    Surveillance CT chest, abdomen and pelvis with contrast showed no evidence of recurrence, 13 mm right adrenal nodule, unchanged since 2016, favor benign adrenal adenoma.       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 female is here because of her recently diagnosed colon cancer. She is accompanied by her husband to our multidisciplinary GI clinic today.  She presented with fatigue and dizzines in 10/2014, she also had decreased appetite, mild intermittent blood in stool, moderate constipation, and weight loss about 30lbs. She was seen by her primary care physician, was found to be anemic, and required blood transfusion in October 2016. She was referred to gastroenterologist Dr. Oletta Lamas, and underwent EGD and colonoscopy on 12/21/2014, which showed a ulcerated mass in the sigmoid-rectum, 15 cm from anal verge, in the scope was not able to advance. Multiple biopsy from the sigmoid colon mass showed invasive adenocarcinoma. CT of the chest abdomen and pelvis was obtained, which showed a circumferential mass lesion in the proximal right colon above the ileocecal valve. She was referred to colorectal surgeon Dr. Marcello Moores, and underwent  left scopic right colectomy and sigmoidectomy on 02/15/2015. She was discharged home 5 days after her surgery.  She has revoered very well from her surgery. She has greast appetite, eats well, normal BM, takes miralax as needed, no pain gained 6-7 lbs weight back since the surgery. She has good energy level, and remains to be physically active at home.  She has no children, no family history of colon cancer or other malignancy except her  father had brain tumor.  CURRENT THERAPY: Observation  INTERIM HISTORY:  Olivia Jackson returns for follow-up. She is doing well and feels great. She has a good appetite and has been eating well. Her primary care physician took her off iron supplement. She denies tingling of the hands or feet. Her bowels are normal and she takes Miralax as needed. She takes Tylenol as needed. She had a colonoscopy in October. She checks her blood pressure at home. She is scheduled to see her primary care physician again on 03/31/16. She does not receive regular mammograms.   MEDICAL HISTORY:  Past Medical History:  Diagnosis Date  . Anemia   . Cancer ([place]) 2016   colon ca  . Falls    pt states fell twice this week   . History of blood transfusion    12/2014  . Hypertension     SURGICAL HISTORY: Past Surgical History:  Procedure Laterality Date  . colonscopy     . DILATION AND CURETTAGE OF UTERUS    . LAPAROSCOPIC PARTIAL COLECTOMY N/A 02/15/2015   Procedure: LAPAROSCOPIC RIGHT COLECTOMY AND SIGMOIDECTOMY PROCTOSCOPY;  Surgeon: Leighton Ruff, MD;  Location: [facility]S;  Service: General;  Laterality: N/A;  . UPPER GI ENDOSCOPY    . UPPER GI ENDOSCOPY  02/15/2015   Procedure: UPPER GI ENDOSCOPY;  Surgeon: Leighton Ruff, MD;  Location: [facility];  Service: General;;    SOCIAL HISTORY: Social History   Social History  . Marital status: Married    Spouse name: N/A  . Number of children: N/A  . Years of education: N/A   Occupational History  . Not on file.   Social History Main Topics  . Smoking status: Former Smoker    Packs/day: 0.50    Years: 20.00    Types: Cigarettes    Quit date: 03/04/1995  . Smokeless tobacco: Never Used  . Alcohol use No  . Drug use: No  . Sexual activity: Not on file   Other Topics Concern  . Not on file   Social History Narrative   Married, husband [name]   No children   Independent ADLs, drives    FAMILY HISTORY: Family History  Problem Relation Age of Onset  .  Cancer Father 68    brain tumor     ALLERGIES:  has No Known Allergies.  MEDICATIONS:  Current Outpatient Prescriptions  Medication Sig Dispense Refill  . acetaminophen (TYLENOL) 325 MG tablet Take 2 tablets (650 mg total) by mouth every 6 (six) hours as needed.    . polyethylene glycol (MIRALAX / GLYCOLAX) packet Take 17 g by mouth daily as needed for mild constipation, moderate constipation or severe constipation.    . urea (CARMOL) 10 % cream Apply topically as needed. 71 g 1   No current facility-administered medications for this visit.     REVIEW OF SYSTEMS:   Constitutional: Denies fevers, chills or abnormal night sweats Eyes: Denies blurriness of vision, double vision or watery eyes Ears, nose, mouth, throat, and face: Denies mucositis or sore throat Respiratory: Denies  cough, dyspnea or wheezes Cardiovascular: Denies palpitation, chest discomfort or lower extremity swelling Gastrointestinal:  Denies nausea, heartburn or change in bowel habits Skin: Denies abnormal skin rashes Lymphatics: Denies new lymphadenopathy or easy bruising Neurological:Denies numbness, tingling or new weaknesses Behavioral/Psych: Mood is stable, no new changes  All other systems were reviewed with the patient and are negative.  PHYSICAL EXAMINATION: ECOG PERFORMANCE STATUS: 0  Vitals:   02/01/16 0923  BP: (!) 178/53  Pulse: 89  Resp: 18  Temp: 97.5 F (36.4 C)   Filed Weights   02/01/16 0923  Weight: 160 lb 14.4 oz (73 kg)    GENERAL:alert, no distress and comfortable SKIN: skin color, texture, turgor are normal, no rashes or significant lesions (+) skin pigmentation on hands near resolved EYES: normal, conjunctiva are pink and non-injected, sclera clear OROPHARYNX:no exudate, no erythema and lips, buccal mucosa, and tongue normal  NECK: supple, thyroid normal size, non-tender, without nodularity LYMPH:  no palpable lymphadenopathy in the cervical, axillary or inguinal LUNGS: clear  to auscultation and percussion with normal breathing effort HEART: regular rate & rhythm and no murmurs and no lower extremity edema ABDOMEN:abdomen soft, non-tender and normal bowel sounds. Midline surgical scar and laparoscopic surgical scars are all well healed, no skin erythema or discharge. Musculoskeletal:no cyanosis of digits and no clubbing  PSYCH: alert & oriented x 3 with fluent speech NEURO: no focal motor/sensory deficits  LABORATORY DATA:  I have reviewed the data as listed CBC Latest Ref Rng & Units 02/01/2016 10/26/2015 09/07/2015  WBC 3.9 - 10.3 10e3/uL 8.4 9.8 6.6  Hemoglobin 11.6 - 15.9 g/dL 14.2 14.1 13.4  Hematocrit 34.8 - 46.6 % 44.6 43.7 41.8  Platelets 145 - 400 10e3/uL 318 328 294     Recent Labs  02/16/15 0433 02/17/15 0513 02/18/15 0543  09/07/15 0813 10/26/15 0852 02/01/16 0841  NA 136 136 137  < > 142 142 139  K 3.2* 3.6 3.9  < > 3.8 3.5 4.0  CL 101 103 103  --   --   --   --   CO2 '24 28 28  ' < > '23 24 25  ' GLUCOSE 99 93 95  < > 86 101 134  BUN 9 7 <5*  < > 7.0 9.4 8.3  CREATININE 0.82 0.65 0.64  < > 0.8 0.8 0.8  CALCIUM 8.5* 8.2* 8.3*  < > 9.3 9.5 9.5  GFRNONAA >60 >60 >60  --   --   --   --   GFRAA >60 >60 >60  --   --   --   --   PROT  --   --   --   < > 7.4 8.0 7.9  ALBUMIN  --   --   --   < > 3.7 3.8 3.6  AST  --   --   --   < > '17 21 25  ' ALT  --   --   --   < > '10 14 23  ' ALKPHOS  --   --   --   < > 61 67 81  BILITOT  --   --   --   < > 0.72 0.46 0.37  < > = values in this interval not displayed.  PATHOLOGY REPORT  Diagnosis 02/15/2015 1. Colon, segmental resection for tumor, right - INVASIVE POORLY DIFFERENTIATED ADENOCARCINOMA, SPANNING 8 CM IN GREATEST DIMENSION. - TUMOR INVADES THROUGH MUSCULARIS PROPRIA TO INVOLVE SUBSEROSAL SOFT TISSUES. - MARGIN IS NEGATIVE. - SIXTEEN BENIGN LYMPH  NODES WITH NO TUMOR SEEN (0/16). - SEE ONCOLOGY TEMPLATE. 2. Colon, segmental resection for tumor, sigmoid - INVASIVE WELL DIFFERENTIATED ADENOCARCINOMA  WITH ABUNDANT EXTRACELLULAR MUCIN, SPANNING 5.5 CM IN GREATEST DIMENSION. - TUMOR INVADES THROUGH MUSCULARIS PROPRIA TO INVOLVE SUBSEROSAL SOFT TISSUES. - MARGINS ARE NEGATIVE. - SATELLITE TUMOR DEPOSIT (DISCONTINUOUS EXTRAMURAL EXTENSION) IS PRESENT. - TEN BENIGN LYMPH NODES WITH NO TUMOR SEEN (0/10). - SEE ONCOLOGY TEMPLATE BELOW. 3. Colon, resection margin (donut), distal anastomotic ring - BENIGN COLORECTAL MUCOSA. - NO TUMOR SEEN.  Microscopic Comment 1. COLON AND RECTUM: Specimen: Right colon. Procedure: Right hemicolectomy. Tumor site: Ascending colon. Specimen integrity: Intact. Macroscopic tumor perforation: Not identified. Invasive tumor: Maximum size: 8 cm. Histologic type(s): Adenocarcinoma. Histologic grade and differentiation: G3: poorly differentiated/high grade. Type of polyp in which invasive carcinoma arose: A definitive precursor polyp is not identified. Microscopic extension of invasive tumor: Tumor invades through muscularis propria to involve subserosal soft tissues. Lymph-Vascular invasion: Definitive lymph/vascular invasion is not identified. Peri-neural invasion: Not identified. Tumor deposit(s) (discontinuous extramural extension): No tumor deposits showing a high grade poorly differentiated adenocarcinoma are identified. Resection margins: Proximal margin: Negative. Distal margin: Negative. Circumferential (radial) (posterior ascending, posterior descending; lateral and posterior mid-rectum; and entire lower 1/3 rectum): Negative. Distance closest margin (if all above margins negative): 4.5 cm (radial margin). Treatment effect (neo-adjuvant therapy): Not applicable. Additional polyp(s): Three additional tubular adenomas are present in the right colon specimen. Non-neoplastic findings: Benign unremarkable appendix. Lymph nodes: number examined: 16 (from the right colon specimen); number positive: 0. Pathologic Staging: pT3, pN0. Ancillary studies: The  tumor will be sent for MMR by [place] and MSI by PCR. 2. COLON AND RECTUM: Specimen: Right partial colon (sigmoid colon). Procedure: Right partial colectomy (sigmoid resection). Tumor site: Sigmoid colon (distal aspect of the specimen along the posterior aspect). Specimen integrity: Intact. Macroscopic tumor perforation: Not identified. Invasive tumor: Maximum size: 5.5 cm. Histologic type(s): Adenocarcinoma with abundant extracellular mucin. Histologic grade and differentiation: G1: well differentiated/low grade. Type of polyp in which invasive carcinoma arose: Tumor arose from a tubular adenoma with high grade glandular dysplasia. Microscopic extension of invasive tumor: Tumor invades through muscularis propria to involve subserosal soft tissue. Lymph-Vascular invasion: Definitive lymph/vascular invasion is not identified, however there is a tumor deposit present, see below. Peri-neural invasion: Not identified. Tumor deposit(s) (discontinuous extramural extension): Yes, tumor deposit is present. Resection margins: Proximal margin: Negative. Distal margin: Negative. Circumferential (radial) (posterior ascending, posterior descending; lateral and posterior mid-rectum; and entire lower 1/3 rectum): 3.1 cm. Distance closest margin (if all above margins negative): 2.4 cm (distal margin). Treatment effect (neo-adjuvant therapy): Not applicable. Additional polyp(s): Benign polypoid colorectal mucosa with hyperplastic-type change (possibly representing early hyperplastic polyp formation) is present. Non-neoplastic findings: No additional non-neoplastic findings Lymph nodes: number examined: 10 (from the sigmoid colon specimen); number positive: 0, see comment. Pathologic Staging: pT3, pN1c. Ancillary studies: The tumor will be sent for MMR by IHC and MSI by PCR per colorectal cancer protocol. Comment: Initially, only 7 lymph nodes are identified in the sigmoid colon specimen. The specimen is  placed in fat clearing solution with an additional three lymph nodes identified on second gross assessment. (RH:kh 02-19-15)  Mismatch Repair (MMR) Protein Immunohistochemistry (IHC) IHC Expression Result: MLH1: LOSS OF NUCLEAR EXPRESSION (LESS THAN 5% TUMOR EXPRESSION) MSH2: Preserved nuclear expression (greater 50% tumor expression) MSH6: Preserved nuclear expression (greater 50% tumor expression) PMS2: LOSS OF NUCLEAR EXPRESSION (LESS THAN 5% TUMOR EXPRESSION) * Internal control demonstrates intact nuclear expression Interpretation: ABNORMAL There  is loss of the major and minor MMR proteins MLH1 and PMS2. The loss of expression may be secondary to promoter hyper-methylation, gene mutation or other genetic event. BRAF mutation testing and/or MLH1 methylation testing is indicated. The presence of a BRAF mutation and/or MLH1 hyper-methylation is indicative of a sporadic-type tumor. The absence of either BRAF mutation and/or presence of normal-methylation indicate the possible presence of a hereditary germline mutation (e.g. Lynch syndrome) and referral to genetic counseling is warranted. It is recommended that the loss of protein expression be correlated with molecular based MSI testing.  2. Mismatch Repair (MMR) Protein Immunohistochemistry (IHC) IHC Expression Result: MLH1: Preserved nuclear expression (greater 50% tumor expression) MSH2: Preserved nuclear expression (greater 50% tumor expression) MSH6: Preserved nuclear expression (greater 50% tumor expression) PMS2: Preserved nuclear expression (greater 50% tumor expression) * Internal control demonstrates intact nuclear expression Interpretation: NORMAL       RADIOGRAPHIC STUDIES: I have personally reviewed the radiological images as listed and agreed with the findings in the report.   CT chest, abdomen and pelvis w contrast  10/26/2015 IMPRESSION: Status post right hemicolectomy and sigmoidectomy.  No findings  suspicious for recurrent or metastatic disease.  13 mm right adrenal nodule, technically indeterminate, although unchanged since 2016 and favored to reflect a benign adrenal adenoma.  Additional ancillary findings as above.   ASSESSMENT & PLAN: 75 year old African-American female with past medical history of hypertension, presented with fatigue, anemia and dizziness.  1. Right colon cancer, pT3N0M0 stage IIA, poorly differentiated,MSI-high, and sigmoid-rectal colon cancer, pT3N1cM0 stage IIIB, well differentiated,MSI-stable  -I previously reviewed her surgical pathology findings with patient and her husband in great details. She has 2 multifocal colon adenocarcinoma was distinct features. Her right colon cancer is stage IIA, poorly differentiated, MSI high, has P ref mutation and MLH-1 hypervascular mass lesion, which supports a sporadic colon cancer, unlikely Lynch syndrome. 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 -Her staging CT scan was negative for distant metastasis. Her pre-op CEA was elevated at 7.5, post op CEA came down to 1.7 (normal) -She has completed adjuvant chemotherapy Xeloda, tolerated very well, no complaints -Lab results reviewed with her, CBC and CMP are unremarkable, exam was normal, no clinical concerns of recurrence -I previously reviewed her surveillance CT scan from 10/22/2015, which showed no evidence of recurrence -We'll continue colon cancer surveillance. I recommend routine office follow-up with lab and exam every 3-4 months for the first 2-3 years, then every 6 months, for total of 5 years. I'll obtain a surveillance CT scan every 6-12 months -I encouraged her to continue healthy diet, and exercise regularly. -Her repeated colonoscopy in October 2017 was unremarkable, down by Dr. Oletta Lamas  2. Hypertension -She'll continue follow-up with her primary care physician -I encouraged her to  drink more water, avoid dehydration -She monitors her blood pressure at home -She is not on antihypertension medication  3. Hand and foot syndrome  - secondary to Xeloda, resolved now, still has mild skin pigmentation palms.  4. Routine health screening -I encouraged the patient to resume annual screening mammograms  Plan -Lab reviewed. Tumor marker pending, we will call with these results. -I will see her back in 4 months with lab  All questions were answered. The patient knows to call the clinic with any problems, questions or concerns. I spent 20 minutes counseling the patient face to face. The total time spent in the appointment was 2r minutes and more than 50% was on  counseling.  This document serves as a record of services personally performed by Truitt Merle, MD. It was created on her behalf by Arlyce Harman, a trained medical scribe. The creation of this record is based on the scribe's personal observations and the provider's statements to them. This document has been checked and approved by the attending provider.     Truitt Merle, MD 02/01/2016

## 2016-02-01 ENCOUNTER — Other Ambulatory Visit (HOSPITAL_BASED_OUTPATIENT_CLINIC_OR_DEPARTMENT_OTHER): Payer: Medicare Other

## 2016-02-01 ENCOUNTER — Encounter: Payer: Self-pay | Admitting: Hematology

## 2016-02-01 ENCOUNTER — Ambulatory Visit (HOSPITAL_BASED_OUTPATIENT_CLINIC_OR_DEPARTMENT_OTHER): Payer: Medicare Other | Admitting: Hematology

## 2016-02-01 VITALS — BP 178/53 | HR 89 | Temp 97.5°F | Resp 18 | Ht 62.0 in | Wt 160.9 lb

## 2016-02-01 DIAGNOSIS — I10 Essential (primary) hypertension: Secondary | ICD-10-CM

## 2016-02-01 DIAGNOSIS — C772 Secondary and unspecified malignant neoplasm of intra-abdominal lymph nodes: Secondary | ICD-10-CM

## 2016-02-01 DIAGNOSIS — D509 Iron deficiency anemia, unspecified: Secondary | ICD-10-CM

## 2016-02-01 DIAGNOSIS — C182 Malignant neoplasm of ascending colon: Secondary | ICD-10-CM | POA: Diagnosis not present

## 2016-02-01 DIAGNOSIS — C187 Malignant neoplasm of sigmoid colon: Secondary | ICD-10-CM | POA: Diagnosis not present

## 2016-02-01 LAB — FERRITIN: FERRITIN: 69 ng/mL (ref 9–269)

## 2016-02-01 LAB — CBC WITH DIFFERENTIAL/PLATELET
BASO%: 0.5 % (ref 0.0–2.0)
BASOS ABS: 0 10*3/uL (ref 0.0–0.1)
EOS ABS: 0.3 10*3/uL (ref 0.0–0.5)
EOS%: 3.5 % (ref 0.0–7.0)
HCT: 44.6 % (ref 34.8–46.6)
HEMOGLOBIN: 14.2 g/dL (ref 11.6–15.9)
LYMPH%: 21.9 % (ref 14.0–49.7)
MCH: 26.7 pg (ref 25.1–34.0)
MCHC: 31.7 g/dL (ref 31.5–36.0)
MCV: 84.2 fL (ref 79.5–101.0)
MONO#: 0.5 10*3/uL (ref 0.1–0.9)
MONO%: 5.6 % (ref 0.0–14.0)
NEUT%: 68.5 % (ref 38.4–76.8)
NEUTROS ABS: 5.7 10*3/uL (ref 1.5–6.5)
PLATELETS: 318 10*3/uL (ref 145–400)
RBC: 5.3 10*6/uL (ref 3.70–5.45)
RDW: 13.5 % (ref 11.2–14.5)
WBC: 8.4 10*3/uL (ref 3.9–10.3)
lymph#: 1.8 10*3/uL (ref 0.9–3.3)

## 2016-02-01 LAB — COMPREHENSIVE METABOLIC PANEL
ALBUMIN: 3.6 g/dL (ref 3.5–5.0)
ALK PHOS: 81 U/L (ref 40–150)
ALT: 23 U/L (ref 0–55)
ANION GAP: 11 meq/L (ref 3–11)
AST: 25 U/L (ref 5–34)
BILIRUBIN TOTAL: 0.37 mg/dL (ref 0.20–1.20)
BUN: 8.3 mg/dL (ref 7.0–26.0)
CALCIUM: 9.5 mg/dL (ref 8.4–10.4)
CO2: 25 mEq/L (ref 22–29)
Chloride: 104 mEq/L (ref 98–109)
Creatinine: 0.8 mg/dL (ref 0.6–1.1)
EGFR: 80 mL/min/{1.73_m2} — AB (ref >90)
Glucose: 134 mg/dl (ref 70–140)
POTASSIUM: 4 meq/L (ref 3.5–5.1)
Sodium: 139 mEq/L (ref 136–145)
TOTAL PROTEIN: 7.9 g/dL (ref 6.4–8.3)

## 2016-02-01 LAB — CEA (IN HOUSE-CHCC): CEA (CHCC-In House): 1.61 ng/mL (ref 0.00–5.00)

## 2016-02-01 LAB — IRON AND TIBC
%SAT: 21 % (ref 21–57)
IRON: 68 ug/dL (ref 41–142)
TIBC: 330 ug/dL (ref 236–444)
UIBC: 262 ug/dL (ref 120–384)

## 2016-02-02 LAB — CEA: CEA1: 3.3 ng/mL (ref 0.0–4.7)

## 2016-02-11 ENCOUNTER — Telehealth: Payer: Self-pay | Admitting: *Deleted

## 2016-02-11 NOTE — Telephone Encounter (Signed)
-----   Message from Truitt Merle, MD sent at 02/02/2016  8:40 PM EST ----- Please call pt and let her know that her CEA and iron study results are all normal.   Truitt Merle  02/02/2016

## 2016-02-11 NOTE — Telephone Encounter (Signed)
Spoke with patient and let her know that her CEA and iron study results are all normal.  She appreciated the phone call.

## 2016-05-11 ENCOUNTER — Telehealth: Payer: Self-pay | Admitting: Hematology

## 2016-05-11 NOTE — Telephone Encounter (Signed)
Lvm advising appt moved from 4/2 md pal to 4/16 @ 9.15am.

## 2016-06-02 ENCOUNTER — Other Ambulatory Visit: Payer: Medicare Other

## 2016-06-02 ENCOUNTER — Ambulatory Visit: Payer: Medicare Other | Admitting: Hematology

## 2016-06-09 NOTE — Progress Notes (Signed)
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 Colonoscopy showed a ulcerated completely obstructing large mass in the rectosigmoid colon, 15 cm from anus. Diverticulosis in the rectosigmoid colon. Biopsy was taken from the mass.      12/29/2014 Imaging    CT chest, abdomen and pelvis showed a circumferential mass lesion in the proximal right colon above the ileocecal valve consistent with carcinoma of the colon. No adenopathy. No distant metastasis.      02/15/2015 Initial Diagnosis    Cancer of right colon ([place])      02/15/2015 Surgery    Laparoscopic right colectomy and sigmoidectomy      02/15/2015 Pathology Results    Right colon segmental resection showed a invasive poorly differentiated adenocarcinoma, spanning 8 cm, T3, margins were negative, 16 lymph nodes. No lymphovascular invasion, no perineural invasion. MMR showed loss of expression of ML H1 and PMS2, MSI-H      02/15/2015 Pathology Results    Sigmoid colon segmental resection showed invasive well differentiated adenocarcinoma, 5.5 cm, T3, 10 lymph nodes were negative, (+) tumor deposit. margins were negative       04/04/2015 - 09/21/2015 Chemotherapy    Adjuvant Xeloda 1500 mg bid 14 days on-7 days off, s/p 8 cycles       10/26/2015 Imaging    Surveillance CT chest, abdomen and pelvis with contrast showed no evidence of recurrence, 13 mm right adrenal nodule, unchanged since 2016, favor benign adrenal adenoma.       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 female is here because of her recently diagnosed colon cancer. She is accompanied by her husband to our multidisciplinary GI clinic today.  She presented with fatigue and dizzines in 10/2014, she also had decreased appetite, mild intermittent blood in stool, moderate constipation, and weight loss about 30lbs. She was seen by her primary care physician, was found to be anemic, and required blood transfusion in October 2016. She was referred to gastroenterologist Dr. Oletta Lamas, and underwent EGD and colonoscopy on 12/21/2014, which showed a ulcerated mass in the sigmoid-rectum, 15 cm from anal verge, in the scope was not able to advance. Multiple biopsy from the sigmoid colon mass showed invasive adenocarcinoma. CT of the chest abdomen and pelvis was obtained, which showed a circumferential mass lesion in the proximal right colon above the ileocecal valve. She was referred to colorectal surgeon Dr. Marcello Moores, and underwent  left scopic right colectomy and sigmoidectomy on 02/15/2015. She was discharged home 5 days after her surgery.  She has revoered very well from her surgery. She has greast appetite, eats well, normal BM, takes miralax as needed, no pain gained 6-7 lbs weight back since the surgery. She has good energy level, and remains to be physically active at home.  She has no children, no family history of colon cancer or other malignancy except her  father had brain tumor.  CURRENT THERAPY: Surveillance  INTERIM HISTORY:  Olivia Jackson returns for follow-up. She is doing well today. Denies fatigue, loss of appetite, abdominal pain, nausea, bowel problems, or any other concerns.   MEDICAL HISTORY:  Past Medical History:  Diagnosis Date  . Anemia   . Cancer ([place]) 2016   colon ca  . [place]    pt states fell twice this week   . History of blood transfusion    12/2014  . Hypertension     SURGICAL HISTORY: Past Surgical History:  Procedure Laterality Date  . colonscopy     . DILATION AND CURETTAGE OF UTERUS    . LAPAROSCOPIC PARTIAL COLECTOMY N/A 02/15/2015   Procedure: LAPAROSCOPIC RIGHT COLECTOMY AND SIGMOIDECTOMY PROCTOSCOPY;  Surgeon: Leighton Ruff, MD;  Location: [facility];  Service: General;  Laterality: N/A;  . UPPER GI ENDOSCOPY    . UPPER GI ENDOSCOPY  02/15/2015   Procedure: UPPER GI ENDOSCOPY;  Surgeon: Leighton Ruff, MD;  Location: [facility];  Service: General;;    SOCIAL HISTORY: Social History   Social History  . Marital status: Married    Spouse name: N/A  . Number of children: N/A  . Years of education: N/A   Occupational History  . Not on file.   Social History Main Topics  . Smoking status: Former Smoker    Packs/day: 0.50    Years: 20.00    Types: Cigarettes    Quit date: 03/04/1995  . Smokeless tobacco: Never Used  . Alcohol use No  . Drug use: No  . Sexual activity: Not on file   Other Topics Concern  . Not on file   Social History Narrative   Married, husband [name]   No children   Independent ADLs, drives    FAMILY HISTORY: Family History  Problem Relation Age of Onset  . Cancer Father 12    brain tumor     ALLERGIES:  has No Known Allergies.  MEDICATIONS:  Current Outpatient Prescriptions  Medication Sig Dispense Refill  . acetaminophen (TYLENOL) 325 MG tablet Take 2 tablets (650 mg total) by mouth every 6 (six) hours as needed.    . polyethylene glycol (MIRALAX / GLYCOLAX) packet  Take 17 g by mouth daily as needed for mild constipation, moderate constipation or severe constipation.    . urea (CARMOL) 10 % cream Apply topically as needed. (Patient not taking: Reported on 06/16/2016) 71 g 1   No current facility-administered medications for this visit.     REVIEW OF SYSTEMS:   Constitutional: Denies fevers, chills or abnormal night sweats Eyes: Denies blurriness of vision, double vision or watery eyes Ears, nose, mouth, throat, and face: Denies mucositis or sore throat Respiratory: Denies cough, dyspnea or wheezes Cardiovascular: Denies palpitation, chest discomfort or lower extremity swelling Gastrointestinal:  Denies nausea, heartburn or change in bowel habits Skin: Denies abnormal skin rashes Lymphatics: Denies new lymphadenopathy or easy bruising Neurological:Denies numbness, tingling or new weaknesses Behavioral/Psych: Mood is stable, no new changes  All other systems were reviewed with  the patient and are negative.  PHYSICAL EXAMINATION: ECOG PERFORMANCE STATUS: 0  Vitals:   06/16/16 0917  BP: 118/79  Pulse: 78  Resp: 18  Temp: 97.8 F (36.6 C)   Filed Weights   06/16/16 0917  Weight: 160 lb 4.8 oz (72.7 kg)   GENERAL:alert, no distress and comfortable SKIN: skin color, texture, turgor are normal, no rashes or significant lesions (+) skin pigmentation on hands near resolved EYES: normal, conjunctiva are pink and non-injected, sclera clear OROPHARYNX:no exudate, no erythema and lips, buccal mucosa, and tongue normal  NECK: supple, thyroid normal size, non-tender, without nodularity LYMPH:  no palpable lymphadenopathy in the cervical, axillary or inguinal LUNGS: clear to auscultation and percussion with normal breathing effort HEART: regular rate & rhythm and no murmurs and no lower extremity edema ABDOMEN:abdomen soft, non-tender and normal bowel sounds. Midline surgical scar and laparoscopic surgical scars are all well healed, no skin erythema or  discharge. Musculoskeletal:no cyanosis of digits and no clubbing  PSYCH: alert & oriented x 3 with fluent speech NEURO: no focal motor/sensory deficits  LABORATORY DATA:  I have reviewed the data as listed CBC Latest Ref Rng & Units 06/16/2016 02/01/2016 10/26/2015  WBC 3.9 - 10.3 10e3/uL 8.9 8.4 9.8  Hemoglobin 11.6 - 15.9 g/dL 13.7 14.2 14.1  Hematocrit 34.8 - 46.6 % 43.5 44.6 43.7  Platelets 145 - 400 10e3/uL 343 318 328     Recent Labs  10/26/15 0852 02/01/16 0841 06/16/16 0853  NA 142 139 137  K 3.5 4.0 3.6  CO2 '24 25 24  ' GLUCOSE 101 134 164*  BUN 9.4 8.3 7.2  CREATININE 0.8 0.8 0.9  CALCIUM 9.5 9.5 9.6  PROT 8.0 7.9 7.9  ALBUMIN 3.8 3.6 3.7  AST '21 25 23  ' ALT '14 23 19  ' ALKPHOS 67 81 78  BILITOT 0.46 0.37 0.58    PATHOLOGY REPORT  Diagnosis 02/15/2015 1. Colon, segmental resection for tumor, right - INVASIVE POORLY DIFFERENTIATED ADENOCARCINOMA, SPANNING 8 CM IN GREATEST DIMENSION. - TUMOR INVADES THROUGH MUSCULARIS PROPRIA TO INVOLVE SUBSEROSAL SOFT TISSUES. - MARGIN IS NEGATIVE. - SIXTEEN BENIGN LYMPH NODES WITH NO TUMOR SEEN (0/16). - SEE ONCOLOGY TEMPLATE. 2. Colon, segmental resection for tumor, sigmoid - INVASIVE WELL DIFFERENTIATED ADENOCARCINOMA WITH ABUNDANT EXTRACELLULAR MUCIN, SPANNING 5.5 CM IN GREATEST DIMENSION. - TUMOR INVADES THROUGH MUSCULARIS PROPRIA TO INVOLVE SUBSEROSAL SOFT TISSUES. - MARGINS ARE NEGATIVE. - SATELLITE TUMOR DEPOSIT (DISCONTINUOUS EXTRAMURAL EXTENSION) IS PRESENT. - TEN BENIGN LYMPH NODES WITH NO TUMOR SEEN (0/10). - SEE ONCOLOGY TEMPLATE BELOW. 3. Colon, resection margin (donut), distal anastomotic ring - BENIGN COLORECTAL MUCOSA. - NO TUMOR SEEN.  Microscopic Comment 1. COLON AND RECTUM: Specimen: Right colon. Procedure: Right hemicolectomy. Tumor site: Ascending colon. Specimen integrity: Intact. Macroscopic tumor perforation: Not identified. Invasive tumor: Maximum size: 8 cm. Histologic type(s):  Adenocarcinoma. Histologic grade and differentiation: G3: poorly differentiated/high grade. Type of polyp in which invasive carcinoma arose: A definitive precursor polyp is not identified. Microscopic extension of invasive tumor: Tumor invades through muscularis propria to involve subserosal soft tissues. Lymph-Vascular invasion: Definitive lymph/vascular invasion is not identified. Peri-neural invasion: Not identified. Tumor deposit(s) (discontinuous extramural extension): No tumor deposits showing a high grade poorly differentiated adenocarcinoma are identified. Resection margins: Proximal margin: Negative. Distal margin: Negative. Circumferential (radial) (posterior ascending, posterior descending; lateral and posterior mid-rectum; and entire lower 1/3 rectum): Negative. Distance closest margin (if all above margins negative): 4.5 cm (radial margin). Treatment effect (neo-adjuvant therapy): Not applicable. Additional polyp(s): Three additional tubular adenomas  are present in the right colon specimen. Non-neoplastic findings: Benign unremarkable appendix. Lymph nodes: number examined: 16 (from the right colon specimen); number positive: 0. Pathologic Staging: pT3, pN0. Ancillary studies: The tumor will be sent for MMR by [place] and MSI by PCR. 2. COLON AND RECTUM: Specimen: Right partial colon (sigmoid colon). Procedure: Right partial colectomy (sigmoid resection). Tumor site: Sigmoid colon (distal aspect of the specimen along the posterior aspect). Specimen integrity: Intact. Macroscopic tumor perforation: Not identified. Invasive tumor: Maximum size: 5.5 cm. Histologic type(s): Adenocarcinoma with abundant extracellular mucin. Histologic grade and differentiation: G1: well differentiated/low grade. Type of polyp in which invasive carcinoma arose: Tumor arose from a tubular adenoma with high grade glandular dysplasia. Microscopic extension of invasive tumor: Tumor invades through  muscularis propria to involve subserosal soft tissue. Lymph-Vascular invasion: Definitive lymph/vascular invasion is not identified, however there is a tumor deposit present, see below. Peri-neural invasion: Not identified. Tumor deposit(s) (discontinuous extramural extension): Yes, tumor deposit is present. Resection margins: Proximal margin: Negative. Distal margin: Negative. Circumferential (radial) (posterior ascending, posterior descending; lateral and posterior mid-rectum; and entire lower 1/3 rectum): 3.1 cm. Distance closest margin (if all above margins negative): 2.4 cm (distal margin). Treatment effect (neo-adjuvant therapy): Not applicable. Additional polyp(s): Benign polypoid colorectal mucosa with hyperplastic-type change (possibly representing early hyperplastic polyp formation) is present. Non-neoplastic findings: No additional non-neoplastic findings Lymph nodes: number examined: 10 (from the sigmoid colon specimen); number positive: 0, see comment. Pathologic Staging: pT3, pN1c. Ancillary studies: The tumor will be sent for MMR by IHC and MSI by PCR per colorectal cancer protocol. Comment: Initially, only 7 lymph nodes are identified in the sigmoid colon specimen. The specimen is placed in fat clearing solution with an additional three lymph nodes identified on second gross assessment. (RH:kh 02-19-15)  Mismatch Repair (MMR) Protein Immunohistochemistry (IHC) IHC Expression Result: MLH1: LOSS OF NUCLEAR EXPRESSION (LESS THAN 5% TUMOR EXPRESSION) MSH2: Preserved nuclear expression (greater 50% tumor expression) MSH6: Preserved nuclear expression (greater 50% tumor expression) PMS2: LOSS OF NUCLEAR EXPRESSION (LESS THAN 5% TUMOR EXPRESSION) * Internal control demonstrates intact nuclear expression Interpretation: ABNORMAL There is loss of the major and minor MMR proteins MLH1 and PMS2. The loss of expression may be secondary to promoter hyper-methylation, gene mutation  or other genetic event. BRAF mutation testing and/or MLH1 methylation testing is indicated. The presence of a BRAF mutation and/or MLH1 hyper-methylation is indicative of a sporadic-type tumor. The absence of either BRAF mutation and/or presence of normal-methylation indicate the possible presence of a hereditary germline mutation (e.g. Lynch syndrome) and referral to genetic counseling is warranted. It is recommended that the loss of protein expression be correlated with molecular based MSI testing.  2. Mismatch Repair (MMR) Protein Immunohistochemistry (IHC) IHC Expression Result: MLH1: Preserved nuclear expression (greater 50% tumor expression) MSH2: Preserved nuclear expression (greater 50% tumor expression) MSH6: Preserved nuclear expression (greater 50% tumor expression) PMS2: Preserved nuclear expression (greater 50% tumor expression) * Internal control demonstrates intact nuclear expression Interpretation: NORMAL       RADIOGRAPHIC STUDIES: I have personally reviewed the radiological images as listed and agreed with the findings in the report.  CT chest, abdomen and pelvis w contrast  10/26/2015 IMPRESSION: Status post right hemicolectomy and sigmoidectomy.  No findings suspicious for recurrent or metastatic disease.  13 mm right adrenal nodule, technically indeterminate, although unchanged since 2016 and favored to reflect a benign adrenal adenoma.  Additional ancillary findings as above.   ASSESSMENT & PLAN: 75 y.o. African-American female with past  medical history of hypertension, presented with fatigue, anemia and dizziness.  1. Right colon cancer, pT3N0M0 stage IIA, poorly differentiated,MSI-high, and sigmoid-rectal colon cancer, pT3N1cM0 stage IIIB, well differentiated,MSI-stable  -I previously reviewed her surgical pathology findings with patient and her husband in great details. She has 2 multifocal colon adenocarcinoma was distinct features. Her right colon  cancer is stage IIA, poorly differentiated, MSI high, has P ref mutation and MLH-1 hypervascular mass lesion, which supports a sporadic colon cancer, unlikely Lynch syndrome. 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 -Her staging CT scan was negative for distant metastasis. Her pre-op CEA was elevated at 7.5, post op CEA came down to 1.7 (normal) -She has completed adjuvant chemotherapy Xeloda, tolerated very well, no complaints -Lab results reviewed with her, her fasting blood sugar is high today; 164 -I previously reviewed her surveillance CT scan from 10/22/2015, which showed no evidence of recurrence -We'll continue colon cancer surveillance. I recommend routine office follow-up with lab and exam every 3-4 months for the first 2-3 years, then every 6 months, for total of 5 years. I'll obtain a surveillance CT scan every 6-12 months -I encouraged her to continue healthy diet, and exercise regularly. -Her repeated colonoscopy in October 2017 was unremarkable, down by Dr. Oletta Lamas. Repeat in 3 years.   2. Hypertension -She'll continue follow-up with her primary care physician -I encouraged her to drink more water, avoid dehydration -She monitors her blood pressure at home -She is not on antihypertension medication  3. Hand and foot syndrome  - secondary to Xeloda, near resolved now, still has mild skin pigmentation palms. -refilled urea cream per her request   4. Routine health screening -I encouraged the patient to resume annual screening mammograms  5. Hyperglycemia -She has no history of diabetes, her fasting blood glucose was 164 this morning. -I strongly encouraged her to see her primary care physician. I given her a copy of her lab results today. I'll copy my note to at the [place] [facility] -We discussed diabetic diet, and I encouraged her to exercise regularly.  Plan -Repeat CT scan in August, before next visit.   -Refill urea cream  -Follow up with PCP due to elevated blood sugar.  -I will see her back in 4 months with lab   All questions were answered. The patient knows to call the clinic with any problems, questions or concerns. I spent 20 minutes counseling the patient face to face. The total time spent in the appointment was 25 minutes and more than 50% was on counseling.  This document serves as a record of services personally performed by Truitt Merle, MD. It was created on her behalf by Martinique Casey, a trained medical scribe. The creation of this record is based on the scribe's personal observations and the provider's statements to them. This document has been checked and approved by the attending provider.  I have reviewed the above documentation for accuracy and completeness and I agree with the above.   Truitt Merle, MD 06/16/2016

## 2016-06-16 ENCOUNTER — Other Ambulatory Visit (HOSPITAL_BASED_OUTPATIENT_CLINIC_OR_DEPARTMENT_OTHER): Payer: Medicare Other

## 2016-06-16 ENCOUNTER — Encounter: Payer: Self-pay | Admitting: Hematology

## 2016-06-16 ENCOUNTER — Ambulatory Visit (HOSPITAL_BASED_OUTPATIENT_CLINIC_OR_DEPARTMENT_OTHER): Payer: Medicare Other | Admitting: Hematology

## 2016-06-16 ENCOUNTER — Telehealth: Payer: Self-pay | Admitting: Hematology

## 2016-06-16 VITALS — BP 118/79 | HR 78 | Temp 97.8°F | Resp 18 | Ht 62.0 in | Wt 160.3 lb

## 2016-06-16 DIAGNOSIS — C772 Secondary and unspecified malignant neoplasm of intra-abdominal lymph nodes: Secondary | ICD-10-CM | POA: Diagnosis not present

## 2016-06-16 DIAGNOSIS — C187 Malignant neoplasm of sigmoid colon: Secondary | ICD-10-CM | POA: Diagnosis not present

## 2016-06-16 DIAGNOSIS — D509 Iron deficiency anemia, unspecified: Secondary | ICD-10-CM

## 2016-06-16 DIAGNOSIS — C182 Malignant neoplasm of ascending colon: Secondary | ICD-10-CM | POA: Diagnosis not present

## 2016-06-16 DIAGNOSIS — L271 Localized skin eruption due to drugs and medicaments taken internally: Secondary | ICD-10-CM

## 2016-06-16 DIAGNOSIS — R739 Hyperglycemia, unspecified: Secondary | ICD-10-CM

## 2016-06-16 LAB — CBC WITH DIFFERENTIAL/PLATELET
BASO%: 0.1 % (ref 0.0–2.0)
BASOS ABS: 0 10*3/uL (ref 0.0–0.1)
EOS%: 3.5 % (ref 0.0–7.0)
Eosinophils Absolute: 0.3 10*3/uL (ref 0.0–0.5)
HCT: 43.5 % (ref 34.8–46.6)
HGB: 13.7 g/dL (ref 11.6–15.9)
LYMPH%: 19.6 % (ref 14.0–49.7)
MCH: 26.2 pg (ref 25.1–34.0)
MCHC: 31.5 g/dL (ref 31.5–36.0)
MCV: 83.3 fL (ref 79.5–101.0)
MONO#: 0.5 10*3/uL (ref 0.1–0.9)
MONO%: 5.2 % (ref 0.0–14.0)
NEUT#: 6.4 10*3/uL (ref 1.5–6.5)
NEUT%: 71.6 % (ref 38.4–76.8)
PLATELETS: 343 10*3/uL (ref 145–400)
RBC: 5.22 10*6/uL (ref 3.70–5.45)
RDW: 14.9 % — ABNORMAL HIGH (ref 11.2–14.5)
WBC: 8.9 10*3/uL (ref 3.9–10.3)
lymph#: 1.7 10*3/uL (ref 0.9–3.3)

## 2016-06-16 LAB — COMPREHENSIVE METABOLIC PANEL
ALBUMIN: 3.7 g/dL (ref 3.5–5.0)
ALK PHOS: 78 U/L (ref 40–150)
ALT: 19 U/L (ref 0–55)
ANION GAP: 12 meq/L — AB (ref 3–11)
AST: 23 U/L (ref 5–34)
BUN: 7.2 mg/dL (ref 7.0–26.0)
CO2: 24 mEq/L (ref 22–29)
Calcium: 9.6 mg/dL (ref 8.4–10.4)
Chloride: 102 mEq/L (ref 98–109)
Creatinine: 0.9 mg/dL (ref 0.6–1.1)
EGFR: 68 mL/min/{1.73_m2} — AB (ref >90)
Glucose: 164 mg/dl — ABNORMAL HIGH (ref 70–140)
POTASSIUM: 3.6 meq/L (ref 3.5–5.1)
Sodium: 137 mEq/L (ref 136–145)
TOTAL PROTEIN: 7.9 g/dL (ref 6.4–8.3)
Total Bilirubin: 0.58 mg/dL (ref 0.20–1.20)

## 2016-06-16 LAB — IRON AND TIBC
%SAT: 32 % (ref 21–57)
IRON: 98 ug/dL (ref 41–142)
TIBC: 304 ug/dL (ref 236–444)
UIBC: 206 ug/dL (ref 120–384)

## 2016-06-16 LAB — CEA (IN HOUSE-CHCC): CEA (CHCC-In House): 1.19 ng/mL (ref 0.00–5.00)

## 2016-06-16 LAB — FERRITIN: Ferritin: 86 ng/ml (ref 9–269)

## 2016-06-16 MED ORDER — UREA 10 % EX CREA: TOPICAL_CREAM | CUTANEOUS | 0 refills | Status: DC | PRN

## 2016-06-16 NOTE — Telephone Encounter (Signed)
Gave patient AVS and calender per 4/16 los. [facility] to contact patient with CT schedule.

## 2016-06-17 LAB — CEA: CEA: 2.8 ng/mL (ref 0.0–4.7)

## 2016-10-09 ENCOUNTER — Other Ambulatory Visit (HOSPITAL_BASED_OUTPATIENT_CLINIC_OR_DEPARTMENT_OTHER): Payer: Medicare Other

## 2016-10-09 ENCOUNTER — Ambulatory Visit (HOSPITAL_COMMUNITY)
Admission: RE | Admit: 2016-10-09 | Discharge: 2016-10-09 | Disposition: A | Discharge status: Home / Self Care | Payer: Medicare Other | Source: Ambulatory Visit | Attending: Hematology | Admitting: Hematology

## 2016-10-09 ENCOUNTER — Encounter (HOSPITAL_COMMUNITY): Payer: Self-pay

## 2016-10-09 DIAGNOSIS — I2510 Atherosclerotic heart disease of native coronary artery without angina pectoris: Secondary | ICD-10-CM | POA: Diagnosis not present

## 2016-10-09 DIAGNOSIS — I700 Atherosclerosis of aorta: Secondary | ICD-10-CM | POA: Diagnosis not present

## 2016-10-09 DIAGNOSIS — K449 Diaphragmatic hernia without obstruction or gangrene: Secondary | ICD-10-CM | POA: Diagnosis not present

## 2016-10-09 DIAGNOSIS — C187 Malignant neoplasm of sigmoid colon: Secondary | ICD-10-CM | POA: Diagnosis not present

## 2016-10-09 DIAGNOSIS — C772 Secondary and unspecified malignant neoplasm of intra-abdominal lymph nodes: Secondary | ICD-10-CM

## 2016-10-09 DIAGNOSIS — C182 Malignant neoplasm of ascending colon: Secondary | ICD-10-CM | POA: Diagnosis not present

## 2016-10-09 LAB — CBC WITH DIFFERENTIAL/PLATELET
BASO%: 0.4 % (ref 0.0–2.0)
Basophils Absolute: 0 10*3/uL (ref 0.0–0.1)
EOS ABS: 0.2 10*3/uL (ref 0.0–0.5)
EOS%: 1.7 % (ref 0.0–7.0)
HCT: 42.4 % (ref 34.8–46.6)
HGB: 13.8 g/dL (ref 11.6–15.9)
LYMPH%: 20.7 % (ref 14.0–49.7)
MCH: 26.6 pg (ref 25.1–34.0)
MCHC: 32.5 g/dL (ref 31.5–36.0)
MCV: 82.1 fL (ref 79.5–101.0)
MONO#: 0.5 10*3/uL (ref 0.1–0.9)
MONO%: 4.6 % (ref 0.0–14.0)
NEUT%: 72.6 % (ref 38.4–76.8)
NEUTROS ABS: 7.3 10*3/uL — AB (ref 1.5–6.5)
Platelets: 358 10*3/uL (ref 145–400)
RBC: 5.16 10*6/uL (ref 3.70–5.45)
RDW: 15.5 % — ABNORMAL HIGH (ref 11.2–14.5)
WBC: 10 10*3/uL (ref 3.9–10.3)
lymph#: 2.1 10*3/uL (ref 0.9–3.3)

## 2016-10-09 LAB — COMPREHENSIVE METABOLIC PANEL
ALT: 20 U/L (ref 0–55)
AST: 25 U/L (ref 5–34)
Albumin: 3.6 g/dL (ref 3.5–5.0)
Alkaline Phosphatase: 81 U/L (ref 40–150)
Anion Gap: 10 mEq/L (ref 3–11)
BUN: 7.6 mg/dL (ref 7.0–26.0)
CHLORIDE: 103 meq/L (ref 98–109)
CO2: 27 meq/L (ref 22–29)
Calcium: 9.7 mg/dL (ref 8.4–10.4)
Creatinine: 0.9 mg/dL (ref 0.6–1.1)
EGFR: 74 mL/min/{1.73_m2} — ABNORMAL LOW (ref >90)
GLUCOSE: 126 mg/dL (ref 70–140)
POTASSIUM: 3.7 meq/L (ref 3.5–5.1)
SODIUM: 140 meq/L (ref 136–145)
Total Bilirubin: 0.55 mg/dL (ref 0.20–1.20)
Total Protein: 8 g/dL (ref 6.4–8.3)

## 2016-10-09 LAB — CEA (IN HOUSE-CHCC): CEA (CHCC-In House): 1.04 ng/mL (ref 0.00–5.00)

## 2016-10-09 MED ORDER — IOPAMIDOL (ISOVUE-300) INJECTION 61%
INTRAVENOUS | Status: AC
  Filled 2016-10-09: qty 100

## 2016-10-09 MED ORDER — IOPAMIDOL (ISOVUE-300) INJECTION 61%
100.0000 mL | Freq: Once | INTRAVENOUS | Status: AC | PRN
  Administered 2016-10-09: 100 mL via INTRAVENOUS

## 2016-10-15 NOTE — Progress Notes (Signed)
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 Colonoscopy showed a ulcerated completely obstructing large mass in the rectosigmoid colon, 15 cm from anus. Diverticulosis in the rectosigmoid colon. Biopsy was taken from the mass.      12/29/2014 Imaging    CT chest, abdomen and pelvis showed a circumferential mass lesion in the proximal right colon above the ileocecal valve consistent with carcinoma of the colon. No adenopathy. No distant metastasis.      02/15/2015 Initial Diagnosis    Cancer of right colon ([place])      02/15/2015 Surgery    Laparoscopic right colectomy and sigmoidectomy      02/15/2015 Pathology Results    Right colon segmental resection showed a invasive poorly differentiated adenocarcinoma, spanning 8 cm, T3, margins were negative, 16 lymph nodes. No lymphovascular invasion, no perineural invasion. MMR showed loss of expression of ML H1 and PMS2, MSI-H      02/15/2015 Pathology Results    Sigmoid colon segmental resection showed invasive well differentiated adenocarcinoma, 5.5 cm, T3, 10 lymph nodes were negative, (+) tumor deposit. margins were negative       04/04/2015 - 09/21/2015 Chemotherapy    Adjuvant Xeloda 1500 mg bid 14 days on-7 days off, s/p 8 cycles       10/26/2015 Imaging    Surveillance CT chest, abdomen and pelvis with contrast showed no evidence of recurrence, 13 mm right adrenal nodule, unchanged since 2016, favor benign adrenal adenoma.      10/09/2016 Imaging    CT CAP W Contrast 10/09/16 IMPRESSION: 1. No evidence of local tumor recurrence at the ileocolic or distal colonic anastomoses. 2. No evidence of metastatic disease in the chest, abdomen or pelvis. 3. Right adrenal nodule is stable back to 12/19/2014, most compatible with a benign adenoma. 4. Aortic Atherosclerosis (ICD10-I70.0). Two-vessel coronary atherosclerosis. 5. Small hiatal hernia.        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 female is here because of Olivia Jackson recently diagnosed colon cancer. Olivia Jackson is accompanied by Olivia Jackson husband to our multidisciplinary GI clinic today.  Olivia Jackson presented with fatigue and dizzines in 10/2014, Olivia Jackson also had decreased appetite, mild intermittent blood in stool, moderate constipation, and weight loss about 30lbs. Olivia Jackson was seen by Olivia Jackson primary care physician, was found to be anemic, and required blood transfusion in October 2016. Olivia Jackson was referred to gastroenterologist Dr. Oletta Lamas, and underwent EGD  and colonoscopy on 12/21/2014, which showed a ulcerated mass in the sigmoid-rectum, 15 cm from anal verge, in the scope was not able to advance. Multiple biopsy from the sigmoid colon mass showed invasive adenocarcinoma. CT of the chest abdomen and pelvis was obtained, which showed a circumferential mass lesion in the proximal right colon above the ileocecal valve. Olivia Jackson was referred to colorectal surgeon Dr. Marcello Moores, and underwent left  scopic right colectomy and sigmoidectomy on 02/15/2015. Olivia Jackson was discharged home 5 days after Olivia Jackson surgery.  Olivia Jackson has revoered very well from Olivia Jackson surgery. Olivia Jackson has greast appetite, eats well, normal BM, takes miralax as needed, no pain gained 6-7 lbs weight back since the surgery. Olivia Jackson has good energy level, and remains to be physically active at home.  Olivia Jackson has no children, no family history of colon cancer or other malignancy except Olivia Jackson father had brain tumor.  CURRENT THERAPY: Surveillance  INTERIM HISTORY:  Olivia Jackson returns for follow-up. Olivia Jackson presents to the clinic today reporting Olivia Jackson feels great. Olivia Jackson is excited for Olivia Jackson good results and Olivia Jackson is overall doing well. Denies and new pain or symptoms.    MEDICAL HISTORY:  Past Medical History:  Diagnosis Date  . Anemia   . Cancer ([place]) 2016   colon ca  . Falls    pt states fell twice this week   . History of blood transfusion    12/2014  . Hypertension     SURGICAL HISTORY: Past Surgical History:  Procedure Laterality Date  . colonscopy     . DILATION AND CURETTAGE OF UTERUS    . LAPAROSCOPIC PARTIAL COLECTOMY N/A 02/15/2015   Procedure: LAPAROSCOPIC RIGHT COLECTOMY AND SIGMOIDECTOMY PROCTOSCOPY;  Surgeon: Leighton Ruff, MD;  Location: [facility];  Service: General;  Laterality: N/A;  . UPPER GI ENDOSCOPY    . UPPER GI ENDOSCOPY  02/15/2015   Procedure: UPPER GI ENDOSCOPY;  Surgeon: Leighton Ruff, MD;  Location: [facility];  Service: General;;    SOCIAL HISTORY: Social History   Social History  . Marital status: Married    Spouse name: N/A  . Number of children: N/A  . Years of education: N/A   Occupational History  . Not on file.   Social History Main Topics  . Smoking status: Former Smoker    Packs/day: 0.50    Years: 20.00    Types: Cigarettes    Quit date: 03/04/1995  . Smokeless tobacco: Never Used  . Alcohol use No  . Drug use: No  . Sexual activity: Not on file   Other Topics Concern  . Not on file   Social History  Narrative   Married, husband [name]   No children   Independent ADLs, drives    FAMILY HISTORY: Family History  Problem Relation Age of Onset  . Cancer Father 16       brain tumor     ALLERGIES:  has No Known Allergies.  MEDICATIONS:  Current Outpatient Prescriptions  Medication Sig Dispense Refill  . acetaminophen (TYLENOL) 325 MG tablet Take 2 tablets (650 mg total) by mouth every 6 (six) hours as needed.    . polyethylene glycol (MIRALAX / GLYCOLAX) packet Take 17 g by mouth daily as needed for mild constipation, moderate constipation or severe constipation.    . urea (CARMOL) 10 % cream Apply topically as needed. 71 g 0   No current facility-administered medications for this visit.     REVIEW OF SYSTEMS:   Constitutional: Denies fevers, chills or abnormal night sweats Eyes:  Denies blurriness of vision, double vision or watery eyes Ears, nose, mouth, throat, and face: Denies mucositis or sore throat Respiratory: Denies cough, dyspnea or wheezes Cardiovascular: Denies palpitation, chest discomfort or lower extremity swelling Gastrointestinal:  Denies nausea, heartburn or change in bowel habits Skin: Denies abnormal skin rashes Lymphatics: Denies new lymphadenopathy or easy bruising Neurological:Denies numbness, tingling or new weaknesses Behavioral/Psych: Mood is stable, no new changes  All other systems were reviewed with the patient and are negative.  PHYSICAL EXAMINATION: ECOG PERFORMANCE STATUS: 0  Vitals:   10/16/16 0851  BP: 107/84  Pulse: 99  Resp: 20  Temp: 98.1 F (36.7 C)  SpO2: 100%   Filed Weights   10/16/16 0851  Weight: 161 lb 1.6 oz (73.1 kg)     GENERAL:alert, no distress and comfortable SKIN: skin color, texture, turgor are normal, no rashes or significant lesions (+) skin pigmentation on hands near resolved EYES: normal, conjunctiva are pink and non-injected, sclera clear OROPHARYNX:no exudate, no erythema and lips, buccal mucosa, and  tongue normal  NECK: supple, thyroid normal size, non-tender, without nodularity LYMPH:  no palpable lymphadenopathy in the cervical, axillary or inguinal LUNGS: clear to auscultation and percussion with normal breathing effort HEART: regular rate & rhythm and no murmurs and no lower extremity edema ABDOMEN:abdomen soft, non-tender and normal bowel sounds. Midline surgical scar and laparoscopic surgical scars are all well healed, no skin erythema or discharge. Musculoskeletal:no cyanosis of digits and no clubbing  PSYCH: alert & oriented x 3 with fluent speech NEURO: no focal motor/sensory deficits  LABORATORY DATA:  I have reviewed the data as listed CBC Latest Ref Rng & Units 10/09/2016 06/16/2016 02/01/2016  WBC 3.9 - 10.3 10e3/uL 10.0 8.9 8.4  Hemoglobin 11.6 - 15.9 g/dL 13.8 13.7 14.2  Hematocrit 34.8 - 46.6 % 42.4 43.5 44.6  Platelets 145 - 400 10e3/uL 358 343 318     Recent Labs  02/01/16 0841 06/16/16 0853 10/09/16 1058  NA 139 137 140  K 4.0 3.6 3.7  CO2 '25 24 27  ' GLUCOSE 134 164* 126  BUN 8.3 7.2 7.6  CREATININE 0.8 0.9 0.9  CALCIUM 9.5 9.6 9.7  PROT 7.9 7.9 8.0  ALBUMIN 3.6 3.7 3.6  AST '25 23 25  ' ALT '23 19 20  ' ALKPHOS 81 78 81  BILITOT 0.37 0.58 0.55    PATHOLOGY REPORT  Diagnosis 02/15/2015 1. Colon, segmental resection for tumor, right - INVASIVE POORLY DIFFERENTIATED ADENOCARCINOMA, SPANNING 8 CM IN GREATEST DIMENSION. - TUMOR INVADES THROUGH MUSCULARIS PROPRIA TO INVOLVE SUBSEROSAL SOFT TISSUES. - MARGIN IS NEGATIVE. - SIXTEEN BENIGN LYMPH NODES WITH NO TUMOR SEEN (0/16). - SEE ONCOLOGY TEMPLATE. 2. Colon, segmental resection for tumor, sigmoid - INVASIVE WELL DIFFERENTIATED ADENOCARCINOMA WITH ABUNDANT EXTRACELLULAR MUCIN, SPANNING 5.5 CM IN GREATEST DIMENSION. - TUMOR INVADES THROUGH MUSCULARIS PROPRIA TO INVOLVE SUBSEROSAL SOFT TISSUES. - MARGINS ARE NEGATIVE. - SATELLITE TUMOR DEPOSIT (DISCONTINUOUS EXTRAMURAL EXTENSION) IS PRESENT. - TEN BENIGN  LYMPH NODES WITH NO TUMOR SEEN (0/10). - SEE ONCOLOGY TEMPLATE BELOW. 3. Colon, resection margin (donut), distal anastomotic ring - BENIGN COLORECTAL MUCOSA. - NO TUMOR SEEN.  Microscopic Comment 1. COLON AND RECTUM: Specimen: Right colon. Procedure: Right hemicolectomy. Tumor site: Ascending colon. Specimen integrity: Intact. Macroscopic tumor perforation: Not identified. Invasive tumor: Maximum size: 8 cm. Histologic type(s): Adenocarcinoma. Histologic grade and differentiation: G3: poorly differentiated/high grade. Type of polyp in which invasive carcinoma arose: A definitive precursor polyp is not identified. Microscopic extension of invasive tumor: Tumor invades through muscularis propria  to involve subserosal soft tissues. Lymph-Vascular invasion: Definitive lymph/vascular invasion is not identified. Peri-neural invasion: Not identified. Tumor deposit(s) (discontinuous extramural extension): No tumor deposits showing a high grade poorly differentiated adenocarcinoma are identified. Resection margins: Proximal margin: Negative. Distal margin: Negative. Circumferential (radial) (posterior ascending, posterior descending; lateral and posterior mid-rectum; and entire lower 1/3 rectum): Negative. Distance closest margin (if all above margins negative): 4.5 cm (radial margin). Treatment effect (neo-adjuvant therapy): Not applicable. Additional polyp(s): Three additional tubular adenomas are present in the right colon specimen. Non-neoplastic findings: Benign unremarkable appendix. Lymph nodes: number examined: 16 (from the right colon specimen); number positive: 0. Pathologic Staging: pT3, pN0. Ancillary studies: The tumor will be sent for MMR by [place] and MSI by PCR. 2. COLON AND RECTUM: Specimen: Right partial colon (sigmoid colon). Procedure: Right partial colectomy (sigmoid resection). Tumor site: Sigmoid colon (distal aspect of the specimen along the posterior  aspect). Specimen integrity: Intact. Macroscopic tumor perforation: Not identified. Invasive tumor: Maximum size: 5.5 cm. Histologic type(s): Adenocarcinoma with abundant extracellular mucin. Histologic grade and differentiation: G1: well differentiated/low grade. Type of polyp in which invasive carcinoma arose: Tumor arose from a tubular adenoma with high grade glandular dysplasia. Microscopic extension of invasive tumor: Tumor invades through muscularis propria to involve subserosal soft tissue. Lymph-Vascular invasion: Definitive lymph/vascular invasion is not identified, however there is a tumor deposit present, see below. Peri-neural invasion: Not identified. Tumor deposit(s) (discontinuous extramural extension): Yes, tumor deposit is present. Resection margins: Proximal margin: Negative. Distal margin: Negative. Circumferential (radial) (posterior ascending, posterior descending; lateral and posterior mid-rectum; and entire lower 1/3 rectum): 3.1 cm. Distance closest margin (if all above margins negative): 2.4 cm (distal margin). Treatment effect (neo-adjuvant therapy): Not applicable. Additional polyp(s): Benign polypoid colorectal mucosa with hyperplastic-type change (possibly representing early hyperplastic polyp formation) is present. Non-neoplastic findings: No additional non-neoplastic findings Lymph nodes: number examined: 10 (from the sigmoid colon specimen); number positive: 0, see comment. Pathologic Staging: pT3, pN1c. Ancillary studies: The tumor will be sent for MMR by IHC and MSI by PCR per colorectal cancer protocol. Comment: Initially, only 7 lymph nodes are identified in the sigmoid colon specimen. The specimen is placed in fat clearing solution with an additional three lymph nodes identified on second gross assessment. (RH:kh 02-19-15)  Mismatch Repair (MMR) Protein Immunohistochemistry (IHC) IHC Expression Result: MLH1: LOSS OF NUCLEAR EXPRESSION (LESS THAN  5% TUMOR EXPRESSION) MSH2: Preserved nuclear expression (greater 50% tumor expression) MSH6: Preserved nuclear expression (greater 50% tumor expression) PMS2: LOSS OF NUCLEAR EXPRESSION (LESS THAN 5% TUMOR EXPRESSION) * Internal control demonstrates intact nuclear expression Interpretation: ABNORMAL There is loss of the major and minor MMR proteins MLH1 and PMS2. The loss of expression may be secondary to promoter hyper-methylation, gene mutation or other genetic event. BRAF mutation testing and/or MLH1 methylation testing is indicated. The presence of a BRAF mutation and/or MLH1 hyper-methylation is indicative of a sporadic-type tumor. The absence of either BRAF mutation and/or presence of normal-methylation indicate the possible presence of a hereditary germline mutation (e.g. Lynch syndrome) and referral to genetic counseling is warranted. It is recommended that the loss of protein expression be correlated with molecular based MSI testing.  2. Mismatch Repair (MMR) Protein Immunohistochemistry (IHC) IHC Expression Result: MLH1: Preserved nuclear expression (greater 50% tumor expression) MSH2: Preserved nuclear expression (greater 50% tumor expression) MSH6: Preserved nuclear expression (greater 50% tumor expression) PMS2: Preserved nuclear expression (greater 50% tumor expression) * Internal control demonstrates intact nuclear expression Interpretation: NORMAL  RADIOGRAPHIC STUDIES: I have personally reviewed the radiological images as listed and agreed with the findings in the report.  CT CAP W Contrast 10/09/16 IMPRESSION: 1. No evidence of local tumor recurrence at the ileocolic or distal colonic anastomoses. 2. No evidence of metastatic disease in the chest, abdomen or pelvis. 3. Right adrenal nodule is stable back to 12/19/2014, most compatible with a benign adenoma. 4. Aortic Atherosclerosis (ICD10-I70.0). Two-vessel coronary atherosclerosis. 5. Small hiatal  hernia.   CT chest, abdomen and pelvis w contrast  10/26/2015 IMPRESSION: Status post right hemicolectomy and sigmoidectomy.  No findings suspicious for recurrent or metastatic disease.  13 mm right adrenal nodule, technically indeterminate, although unchanged since 2016 and favored to reflect a benign adrenal adenoma.  Additional ancillary findings as above.   ASSESSMENT & PLAN: 76 y.o. African-American female with past medical history of hypertension, presented with fatigue, anemia and dizziness.  1. Right colon cancer, pT3N0M0 stage IIA, poorly differentiated,MSI-high, and sigmoid-rectal colon cancer, pT3N1cM0 stage IIIB, well differentiated,MSI-stable  -I previously reviewed Olivia Jackson surgical pathology findings with patient and Olivia Jackson husband in great details. Olivia Jackson has 2 multifocal colon adenocarcinoma was distinct features. Olivia Jackson right colon cancer is stage IIA, poorly differentiated, MSI high, has P ref mutation and MLH-1 hypervascular mass lesion, which supports a sporadic colon cancer, unlikely Lynch syndrome. Olivia Jackson left sigmoid-ractal adenocarcinoma is well differentiated, stage IIIB with positive tumor deposit, MSI stable. -Olivia Jackson staging CT scan was negative for distant metastasis. Olivia Jackson pre-op CEA was elevated at 7.5, post op CEA came down to 1.7 (normal) -Olivia Jackson has completed adjuvant chemotherapy Xeloda, tolerated very well, no complaints -Lab results reviewed with Olivia Jackson, Olivia Jackson fasting blood sugar is high today; 164 -I previously reviewed Olivia Jackson surveillance CT scan from 10/22/2015, which showed no evidence of recurrence -We'll continue colon cancer surveillance. I recommend routine office follow-up with lab and exam every 3-4 months for the first 2-3 years, then every 6 months, for total of 5 years. I'll obtain a surveillance CT scan every 6-12 months -I encouraged Olivia Jackson to continue healthy diet, and exercise regularly. -Olivia Jackson repeated colonoscopy in October 2017 was unremarkable, down by Dr. Oletta Lamas.  Repeat in 3 years. -Olivia Jackson is clinically doing very well, laboratory reviewed, exam was unremarkable, no clinical concern for recurrence. -Reviewed 10/09/16 CT scan which shows no evidence of cancer recurrence. Shows some calcifications in Olivia Jackson coronary artery vessels. I suggest Olivia Jackson f/u with Olivia Jackson PCP about this. Olivia Jackson is clincally doing well, Olivia Jackson CBC and CMP are within normal limits.  -Olivia Jackson is about 2 years out from diagnosis so we will repeat CT scan in 1 year. Will continue colon cancer surveillance with lab, f/u and exam every 6 months and yearly colonoscopy. -We discussed with the clinical signs of cancer recurrence, I encouraged Olivia Jackson to call me if Olivia Jackson has any concerns.   2. Hypertension -Olivia Jackson'll continue follow-up with Olivia Jackson primary care physician -I encouraged Olivia Jackson to drink more water, avoid dehydration -Olivia Jackson monitors Olivia Jackson blood pressure at home -Olivia Jackson is not on antihypertension medication  3. Hand and foot syndrome  - secondary to Xeloda, near resolved now, still has mild skin pigmentation palms. -resolved now   4. Routine health screening -I encouraged the patient to resume annual screening mammograms  5. Hyperglycemia -Olivia Jackson has no history of diabetes, Olivia Jackson fasting blood glucose was 164 this morning. -I strongly encouraged Olivia Jackson to see Olivia Jackson primary care physician. I given Olivia Jackson a copy of Olivia Jackson lab results today. I'll copy my note to at the [facility] -We discussed  diabetic diet, and I encouraged Olivia Jackson to exercise regularly.  Plan -Scan reviewed with Olivia Jackson, no evidence of recurrence. Continue colon cancer surveillance  -Lab and f/u in 6 months    All questions were answered. The patient knows to call the clinic with any problems, questions or concerns. I spent 20 minutes counseling the patient face to face. The total time spent in the appointment was 25 minutes and more than 50% was on counseling.  This document serves as a record of services personally performed by Truitt Merle, MD. It was created on Olivia Jackson behalf by  Joslyn Devon, a trained medical scribe. The creation of this record is based on the scribe's personal observations and the provider's statements to them. This document has been checked and approved by the attending provider.   I have reviewed the above documentation for accuracy and completeness and I agree with the above.   Truitt Merle, MD 10/16/2016

## 2016-10-16 ENCOUNTER — Telehealth: Payer: Self-pay | Admitting: Hematology

## 2016-10-16 ENCOUNTER — Encounter: Payer: Self-pay | Admitting: Hematology

## 2016-10-16 ENCOUNTER — Ambulatory Visit (HOSPITAL_BASED_OUTPATIENT_CLINIC_OR_DEPARTMENT_OTHER): Payer: Medicare Other | Admitting: Hematology

## 2016-10-16 VITALS — BP 107/84 | HR 99 | Temp 98.1°F | Resp 20 | Ht 62.0 in | Wt 161.1 lb

## 2016-10-16 DIAGNOSIS — I10 Essential (primary) hypertension: Secondary | ICD-10-CM

## 2016-10-16 DIAGNOSIS — C187 Malignant neoplasm of sigmoid colon: Secondary | ICD-10-CM | POA: Diagnosis not present

## 2016-10-16 DIAGNOSIS — C182 Malignant neoplasm of ascending colon: Secondary | ICD-10-CM | POA: Diagnosis not present

## 2016-10-16 DIAGNOSIS — R739 Hyperglycemia, unspecified: Secondary | ICD-10-CM

## 2016-10-16 DIAGNOSIS — C772 Secondary and unspecified malignant neoplasm of intra-abdominal lymph nodes: Secondary | ICD-10-CM

## 2016-10-16 NOTE — Telephone Encounter (Signed)
Scheduled appt per 8/16 los - Gave patient AVS and calender per los.  

## 2016-10-26 IMAGING — CT CT ABD-PELV W/ CM
2 of 5 series · 11 of 46 positions shown, 12 images · IV contrast (omnipaque)
Comparison: None.

CLINICAL DATA: Abdominal pain. Blood in stool. Right lower quadrant
mass. Recent colonoscopy this week

EXAM:
CT ABDOMEN AND PELVIS WITH CONTRAST
TECHNIQUE: Multidetector CT imaging of the abdomen and pelvis was performed
using the standard protocol following bolus administration of
intravenous contrast.
CONTRAST:  80mL OMNIPAQUE IOHEXOL 300 MG/ML  SOLN

[Series 201: routine, idose (2) · axial · 0.69mm/px · z∈[+81,+451]mm · 8 of 94 slices shown, 9 images]
[im 10/94  soft-tissue]
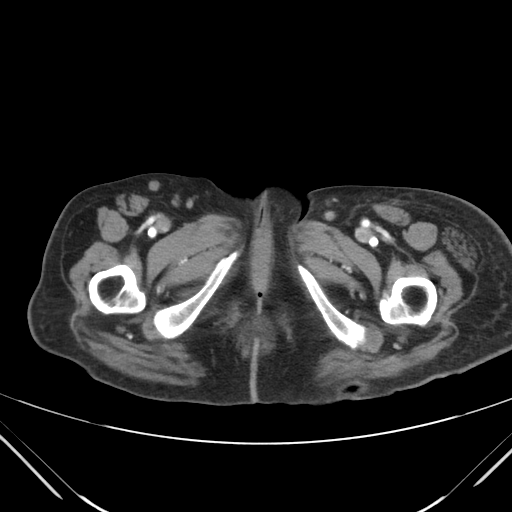
[im 10/94  bone]
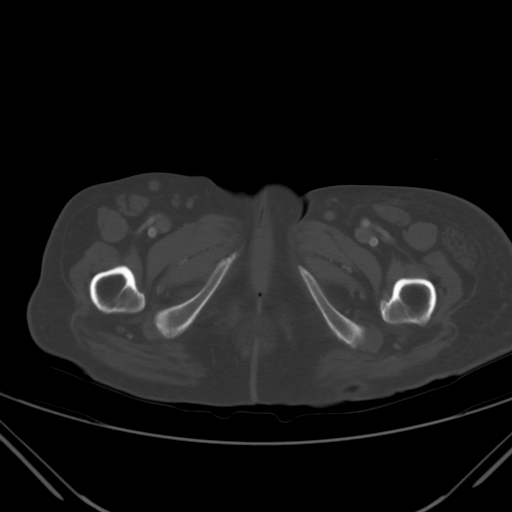
[im 20/94  soft-tissue]
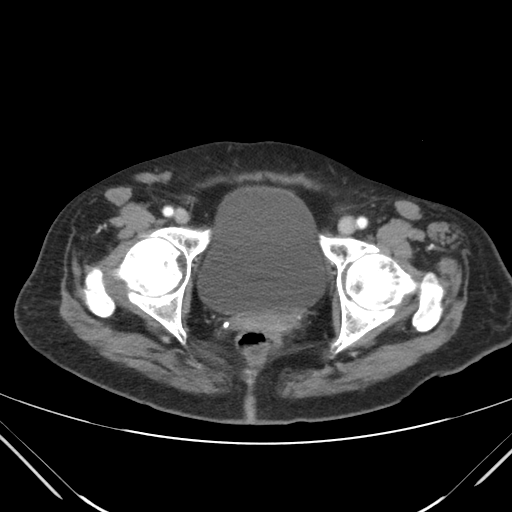
[im 30/94  soft-tissue]
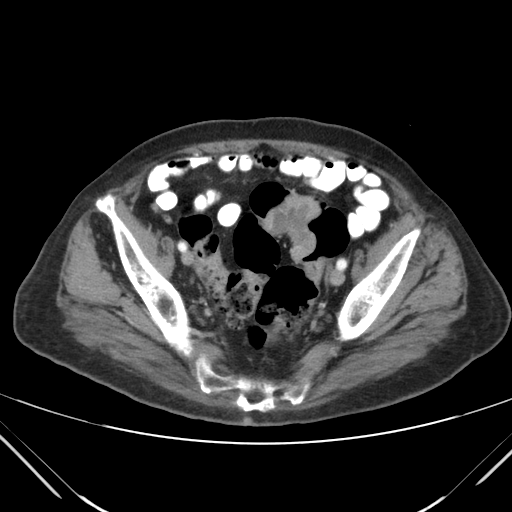
[im 40/94  soft-tissue]
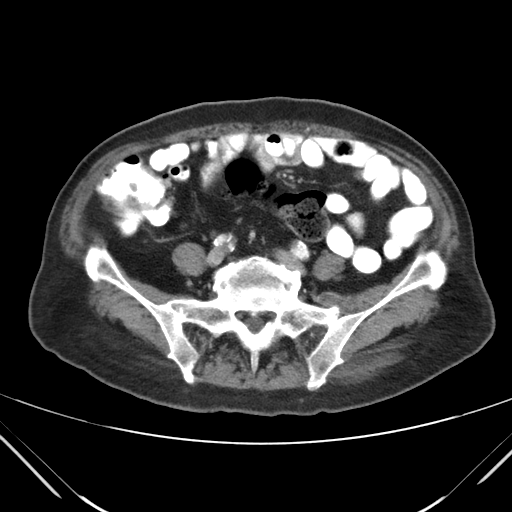
[im 54/94  soft-tissue]
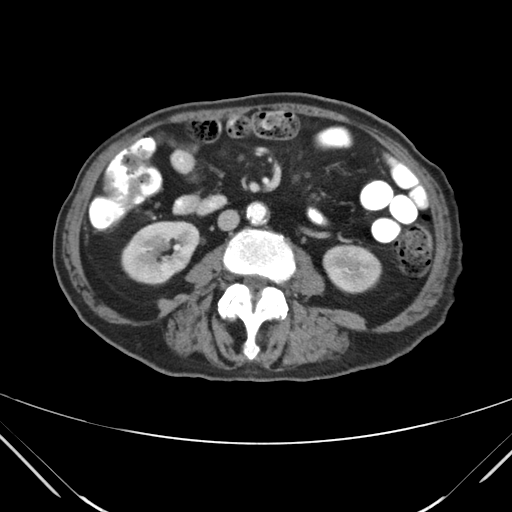
[im 64/94  soft-tissue]
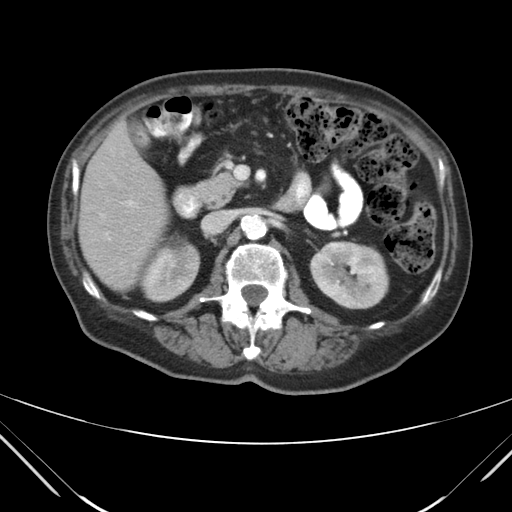
[im 74/94  soft-tissue]
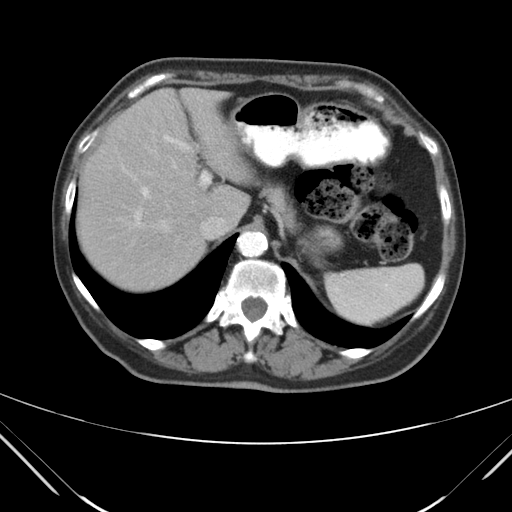
[im 84/94  soft-tissue]
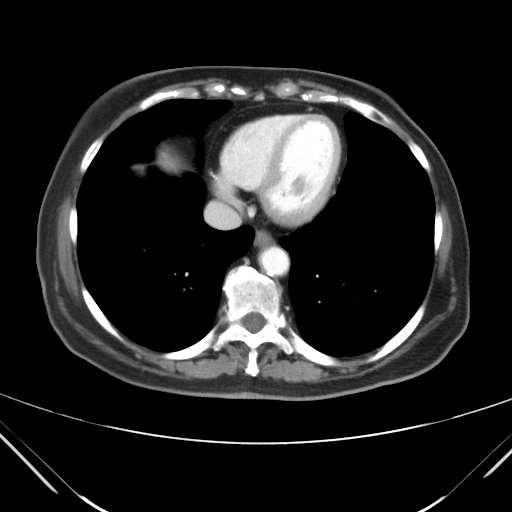

[Series 203: coronals, idose (2) · coronal · 0.45mm/px · 3 of 114 slices shown]
[im 38/114  soft-tissue]
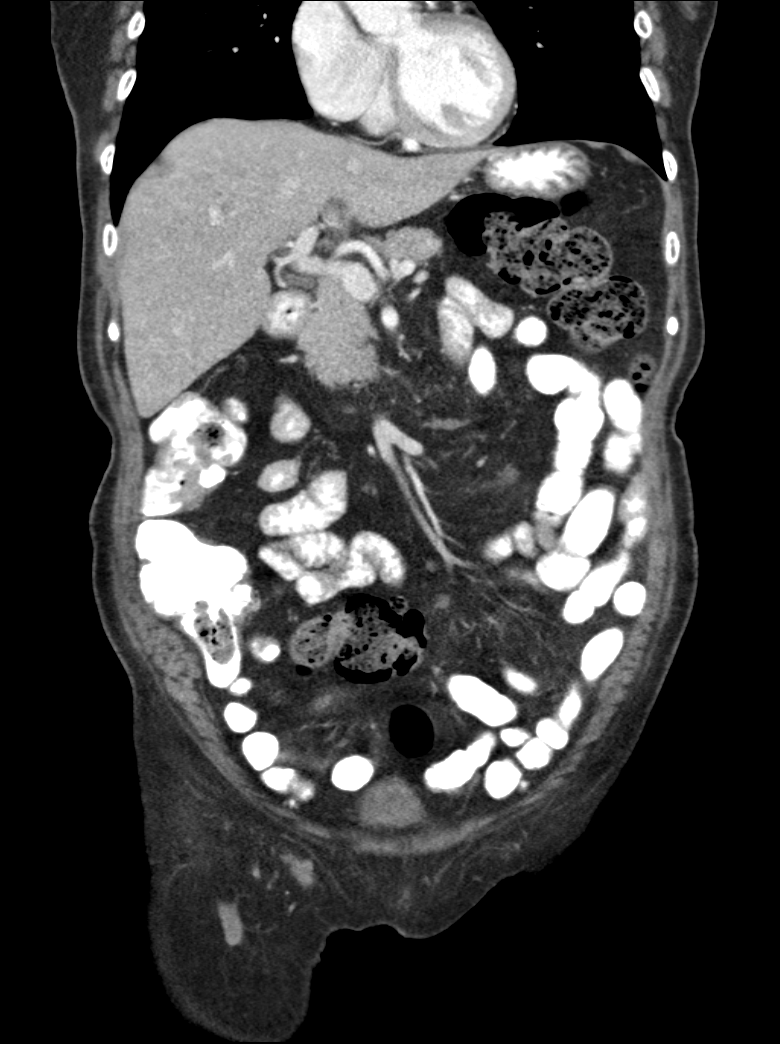
[im 51/114  soft-tissue]
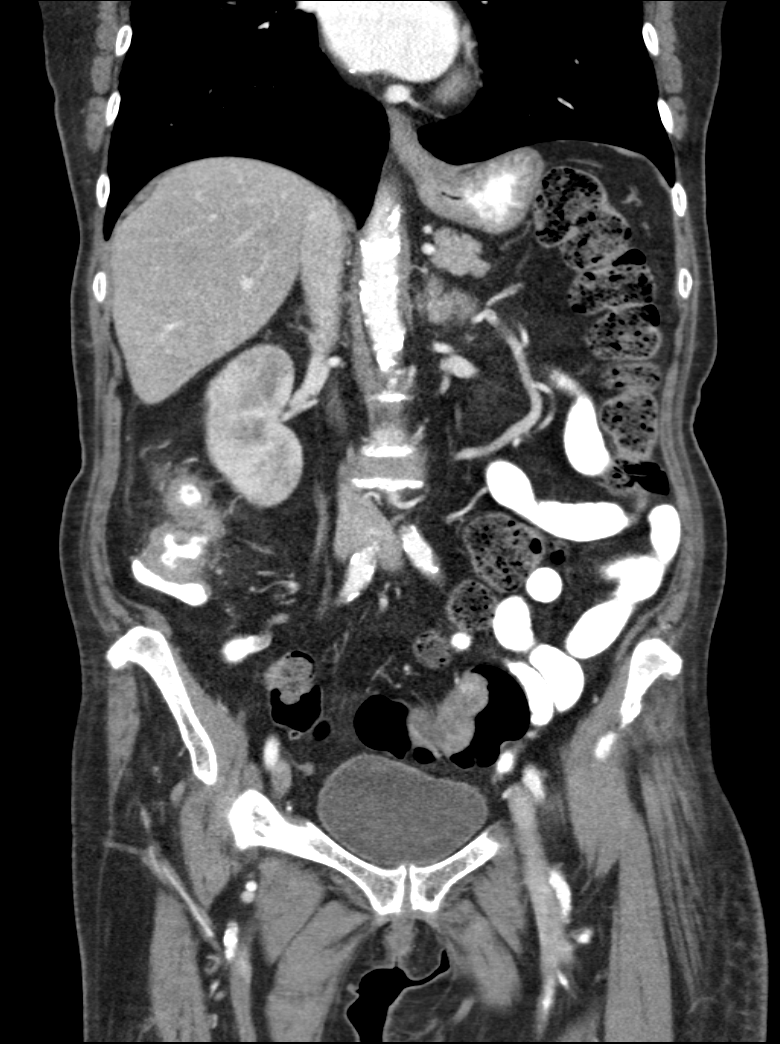
[im 63/114  soft-tissue]
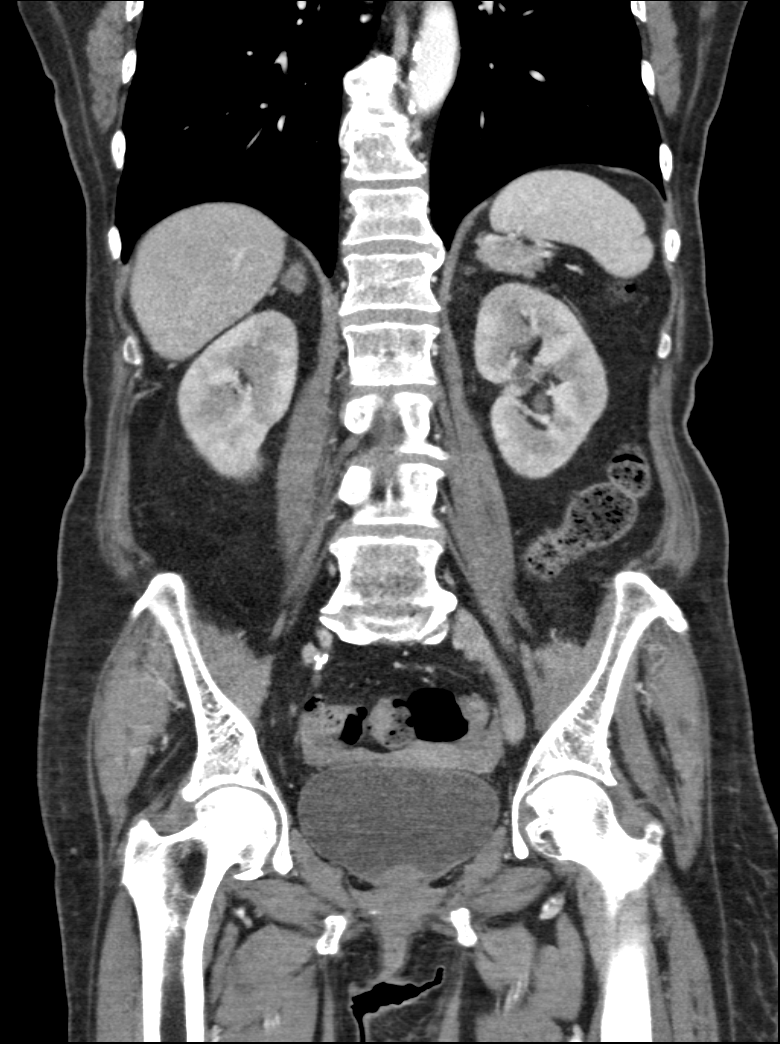

[11 of 46 positions shown; findings below may reference images not displayed]

FINDINGS: Lower chest:  Lung bases are clear.  Coronary calcification.

Hepatobiliary: Normal liver.  Normal gallbladder and bile ducts.

Pancreas: Negative

Spleen: Negative

Adrenals/Urinary Tract: 15 mm right upper pole cyst. No renal mass
or obstruction. Adrenal glands are slightly prominent suggesting
hyperplasia. No mass lesion. Urinary bladder normal.

Stomach/Bowel: Mass lesion involving the proximal right colon above
the ileocecal bowel. There is circumferential soft tissue thickening
. There is mild stranding in the pericolonic fat in the area which
may represent tumor extension or bleeding from recent biopsy. There
is retained stool throughout the colon. Negative for bowel
obstruction.

Vascular/Lymphatic: Atherosclerotic calcification in the aorta and
iliac arteries. No aneurysm.

Negative for adenopathy.

Reproductive: Normal uterus.  No pelvic mass

Other: Negative for ascites.

Musculoskeletal: Disc and facet degeneration in the lumbar spine at
L3-4, L4-5, L5-S1. No fracture or metastatic disease in the spine.
IMPRESSION: Circumferential mass lesion in the proximal right colon above the
ileocecal valve consistent with carcinoma of the colon. There is
stranding in the pericolonic fat which could be due to tumor
extension. No adenopathy.

Constipation without bowel obstruction.

These results will be called to the ordering clinician or
representative by the Radiologist Assistant, and communication
documented in the PACS or zVision Dashboard.

## 2017-04-17 ENCOUNTER — Inpatient Hospital Stay: Payer: Medicare Other | Attending: Hematology | Admitting: Nurse Practitioner

## 2017-04-17 ENCOUNTER — Inpatient Hospital Stay: Payer: Medicare Other

## 2017-04-17 ENCOUNTER — Encounter: Payer: Self-pay | Admitting: Nurse Practitioner

## 2017-04-17 ENCOUNTER — Telehealth: Payer: Self-pay | Admitting: Nurse Practitioner

## 2017-04-17 VITALS — BP 106/76 | HR 81 | Temp 97.6°F | Resp 18 | Ht 62.0 in | Wt 163.3 lb

## 2017-04-17 DIAGNOSIS — C187 Malignant neoplasm of sigmoid colon: Secondary | ICD-10-CM

## 2017-04-17 DIAGNOSIS — R739 Hyperglycemia, unspecified: Secondary | ICD-10-CM | POA: Diagnosis not present

## 2017-04-17 DIAGNOSIS — C182 Malignant neoplasm of ascending colon: Secondary | ICD-10-CM | POA: Diagnosis not present

## 2017-04-17 DIAGNOSIS — I10 Essential (primary) hypertension: Secondary | ICD-10-CM | POA: Insufficient documentation

## 2017-04-17 DIAGNOSIS — C772 Secondary and unspecified malignant neoplasm of intra-abdominal lymph nodes: Principal | ICD-10-CM

## 2017-04-17 LAB — COMPREHENSIVE METABOLIC PANEL
ALT: 14 U/L (ref 0–55)
AST: 22 U/L (ref 5–34)
Albumin: 3.6 g/dL (ref 3.5–5.0)
Alkaline Phosphatase: 77 U/L (ref 40–150)
Anion gap: 11 (ref 3–11)
BUN: 8 mg/dL (ref 7–26)
CHLORIDE: 103 mmol/L (ref 98–109)
CO2: 26 mmol/L (ref 22–29)
Calcium: 9.7 mg/dL (ref 8.4–10.4)
Creatinine, Ser: 0.86 mg/dL (ref 0.60–1.10)
Glucose, Bld: 162 mg/dL — ABNORMAL HIGH (ref 70–140)
POTASSIUM: 3.6 mmol/L (ref 3.5–5.1)
Sodium: 140 mmol/L (ref 136–145)
Total Bilirubin: 0.4 mg/dL (ref 0.2–1.2)
Total Protein: 7.6 g/dL (ref 6.4–8.3)

## 2017-04-17 LAB — CBC WITH DIFFERENTIAL/PLATELET
Basophils Absolute: 0.1 10*3/uL (ref 0.0–0.1)
Basophils Relative: 1 %
Eosinophils Absolute: 0.3 10*3/uL (ref 0.0–0.5)
Eosinophils Relative: 3 %
HCT: 41.5 % (ref 34.8–46.6)
Hemoglobin: 13.4 g/dL (ref 11.6–15.9)
LYMPHS ABS: 2.1 10*3/uL (ref 0.9–3.3)
LYMPHS PCT: 23 %
MCH: 26.9 pg (ref 25.1–34.0)
MCHC: 32.3 g/dL (ref 31.5–36.0)
MCV: 83.2 fL (ref 79.5–101.0)
MONO ABS: 0.5 10*3/uL (ref 0.1–0.9)
MONOS PCT: 5 %
Neutro Abs: 6.4 10*3/uL (ref 1.5–6.5)
Neutrophils Relative %: 68 %
Platelets: 343 10*3/uL (ref 145–400)
RBC: 4.99 MIL/uL (ref 3.70–5.45)
RDW: 15.6 % — AB (ref 11.2–14.5)
WBC: 9.4 10*3/uL (ref 3.9–10.3)

## 2017-04-17 LAB — CEA (IN HOUSE-CHCC)

## 2017-04-17 NOTE — Telephone Encounter (Signed)
Scheduled appt per 2/15 los - Gave patient AVS and calender per los.  

## 2017-04-17 NOTE — Progress Notes (Signed)
[place]  [phone] [phone]  Clinic Follow up Note   Patient Care Team: Alroy Dust, L.Marlou Sa, MD as PCP - General (Family Medicine) Leighton Ruff, MD as Consulting Physician (General Surgery) Truitt Merle, MD as Consulting Physician (Hematology) 04/17/2017  CHIEF COMPLAINT: Follow up stage III colon cancer   SUMMARY OF ONCOLOGIC HISTORY: Oncology History   Cancer of right colon [facility])   Staging form: Colon and Rectum, AJCC 7th Edition     Pathologic stage from 02/15/2015: Stage IIA (T3, N0, cM0) - Signed by Truitt Merle, MD on 03/23/2015 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 Colonoscopy showed a ulcerated completely obstructing large mass in the rectosigmoid colon, 15 cm from anus. Diverticulosis in the rectosigmoid colon. Biopsy was taken from the mass.      12/29/2014 Imaging    CT chest, abdomen and pelvis showed a circumferential mass lesion in the proximal right colon above the ileocecal valve consistent with carcinoma of the colon. No adenopathy. No distant metastasis.      02/15/2015 Initial Diagnosis    Cancer of right colon ([place])      02/15/2015 Surgery    Laparoscopic right colectomy and sigmoidectomy      02/15/2015 Pathology Results    Right colon segmental resection showed a invasive poorly differentiated adenocarcinoma, spanning 8 cm, T3, margins were negative, 16 lymph nodes. No lymphovascular invasion, no perineural invasion. MMR showed loss of expression of ML H1 and PMS2, MSI-H      02/15/2015 Pathology Results    Sigmoid colon segmental resection showed invasive well differentiated adenocarcinoma, 5.5 cm, T3, 10 lymph nodes were negative, (+) tumor deposit.  margins were negative       04/04/2015 - 09/21/2015 Chemotherapy    Adjuvant Xeloda 1500 mg bid 14 days on-7 days off, s/p 8 cycles       10/26/2015 Imaging    Surveillance CT chest, abdomen and pelvis with contrast showed no evidence of recurrence, 13 mm right adrenal nodule, unchanged since 2016, favor benign adrenal adenoma.      10/09/2016 Imaging    CT CAP W Contrast 10/09/16 IMPRESSION: 1. No evidence of local tumor recurrence at the ileocolic or distal colonic anastomoses. 2. No evidence of metastatic disease in the chest, abdomen or pelvis. 3. Right adrenal nodule is stable back to 12/19/2014, most compatible with a benign adenoma. 4. Aortic Atherosclerosis (ICD10-I70.0). Two-vessel coronary atherosclerosis. 5. Small hiatal hernia.        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 Soth returns for follow-up as scheduled.  She denies any changes in her health or new medical diagnoses since last follow-up in 10/2016.  Good appetite, eating and drinking well.  Denies abnormal weight loss.  Bowel movements are normal, uses MiraLAX as needed.  No blood in stool, domino pain, nausea, or diarrhea.  She remains active.  Continues to have mild hyperpigmentation to palms, skin feels tough but not painful or sensitive, denies numbness or tingling.  She  maintains close follow-up with PCP and other healthcare providers.  She received the flu vaccine this year and 12/20/2016.  Denies other concerns today.  REVIEW OF SYSTEMS:   Constitutional: Denies fatigue, fevers, chills or abnormal weight loss (+) good appetite Eyes: Denies blurriness of vision Ears, nose, mouth, throat, and face: Denies mucositis or sore throat Respiratory: Denies cough, dyspnea or wheezes Cardiovascular: Denies palpitation,  chest discomfort, or lower extremity swelling Gastrointestinal:  Denies nausea, vomiting, constipation, diarrhea, abdominal pain, hematochezia, heartburn or change in bowel habits (+) occasional MiraLAX use for constipation Skin: Denies abnormal skin rashes (+) skin darkening to palms Lymphatics: Denies new lymphadenopathy or easy bruising (+) left lower extremity swelling, present since childhood, related to leg injury Neurological:Denies numbness, tingling or new weaknesses Behavioral/Psych: Mood is stable, no new changes  All other systems were reviewed with the patient and are negative.  MEDICAL HISTORY:  Past Medical History:  Diagnosis Date  . Anemia   . Cancer ([place]) 2016   colon ca  . Falls    pt states fell twice this week   . History of blood transfusion    12/2014  . Hypertension     SURGICAL HISTORY: Past Surgical History:  Procedure Laterality Date  . colonscopy     . DILATION AND CURETTAGE OF UTERUS    . LAPAROSCOPIC PARTIAL COLECTOMY N/A 02/15/2015   Procedure: LAPAROSCOPIC RIGHT COLECTOMY AND SIGMOIDECTOMY PROCTOSCOPY;  Surgeon: Leighton Ruff, MD;  Location: [facility];  Service: General;  Laterality: N/A;  . UPPER GI ENDOSCOPY    . UPPER GI ENDOSCOPY  02/15/2015   Procedure: UPPER GI ENDOSCOPY;  Surgeon: Leighton Ruff, MD;  Location: [facility];  Service: General;;    I have reviewed the social history and family history with the patient and they are unchanged from previous note.  ALLERGIES:  has No Known Allergies.  MEDICATIONS:  Current Outpatient Medications  Medication Sig Dispense Refill  . acetaminophen (TYLENOL) 325 MG tablet Take 2 tablets (650 mg total) by mouth every 6 (six) hours as needed.    . polyethylene glycol (MIRALAX / GLYCOLAX) packet Take 17 g by mouth daily as needed for mild constipation, moderate constipation or severe constipation.    . urea (CARMOL) 10 % cream Apply topically as needed. 71 g 0   No current facility-administered medications  for this visit.     PHYSICAL EXAMINATION: ECOG PERFORMANCE STATUS: 0 - Asymptomatic  Vitals:   04/17/17 0903  BP: 106/76  Pulse: 81  Resp: 18  Temp: 97.6 F (36.4 C)  SpO2: 100%   Filed Weights   04/17/17 0903  Weight: 163 lb 4.8 oz (74.1 kg)    GENERAL:alert, no distress and comfortable SKIN: skin color, texture, turgor are normal, no rashes or significant lesions EYES: normal, Conjunctiva are pink and non-injected, sclera clear OROPHARYNX:no exudate, no erythema and lips, buccal mucosa, and tongue normal  NECK: supple, thyroid normal size, non-tender, without nodularity LYMPH:  no palpable cervical, supraclavicular, axillary, or inguinal lymphadenopathy LUNGS: clear to auscultation bilaterally with normal breathing effort HEART: regular rate & rhythm and no murmurs and no lower extremity edema ABDOMEN:abdomen soft, non-tender and normal bowel sounds. Abdominal incision is well healed. No hepatomegaly  Musculoskeletal:no cyanosis of digits and no clubbing (+) left leg lymphedema from ankle to upper thigh NEURO: alert & oriented x 3 with fluent speech, no focal motor/sensory deficits  LABORATORY DATA:  I have reviewed the data as listed CBC Latest Ref Rng & Units 04/17/2017 10/09/2016 06/16/2016  WBC 3.9 - 10.3 K/uL 9.4 10.0 8.9  Hemoglobin 11.6 - 15.9 g/dL 13.4 13.8 13.7  Hematocrit 34.8 - 46.6 % 41.5 42.4 43.5  Platelets 145 - 400 K/uL 343 358 343     CMP Latest Ref Rng & Units 04/17/2017 10/09/2016 06/16/2016  Glucose 70 - 140 mg/dL 162(H) 126 164(H)  BUN 7 - 26 mg/dL 8 7.6 7.2  Creatinine 0.60 - 1.10 mg/dL 0.86 0.9 0.9  Sodium 136 - 145 mmol/L 140 140 137  Potassium 3.5 - 5.1 mmol/L 3.6 3.7 3.6  Chloride 98 - 109 mmol/L 103 - -  CO2 22 - 29 mmol/L '26 27 24  ' Calcium 8.4 - 10.4 mg/dL 9.7 9.7 9.6  Total Protein 6.4 - 8.3 g/dL 7.6 8.0 7.9  Total Bilirubin 0.2 - 1.2 mg/dL 0.4 0.55 0.58  Alkaline Phos 40 - 150 U/L 77 81 78  AST 5 - 34 U/L '22 25 23  ' ALT 0 - 55 U/L '14 20 19    ' PATHOLOGY REPORT  Diagnosis 02/15/2015 1. Colon, segmental resection for tumor, right - INVASIVE POORLY DIFFERENTIATED ADENOCARCINOMA, SPANNING 8 CM IN GREATEST DIMENSION. - TUMOR INVADES THROUGH MUSCULARIS PROPRIA TO INVOLVE SUBSEROSAL SOFT TISSUES. - MARGIN IS NEGATIVE. - SIXTEEN BENIGN LYMPH NODES WITH NO TUMOR SEEN (0/16). - SEE ONCOLOGY TEMPLATE. 2. Colon, segmental resection for tumor, sigmoid - INVASIVE WELL DIFFERENTIATED ADENOCARCINOMA WITH ABUNDANT EXTRACELLULAR MUCIN, SPANNING 5.5 CM IN GREATEST DIMENSION. - TUMOR INVADES THROUGH MUSCULARIS PROPRIA TO INVOLVE SUBSEROSAL SOFT TISSUES. - MARGINS ARE NEGATIVE. - SATELLITE TUMOR DEPOSIT (DISCONTINUOUS EXTRAMURAL EXTENSION) IS PRESENT. - TEN BENIGN LYMPH NODES WITH NO TUMOR SEEN (0/10). - SEE ONCOLOGY TEMPLATE BELOW. 3. Colon, resection margin (donut), distal anastomotic ring - BENIGN COLORECTAL MUCOSA. - NO TUMOR SEEN.  Microscopic Comment 1. COLON AND RECTUM: Specimen: Right colon. Procedure: Right hemicolectomy. Tumor site: Ascending colon. Specimen integrity: Intact. Macroscopic tumor perforation: Not identified. Invasive tumor: Maximum size: 8 cm. Histologic type(s): Adenocarcinoma. Histologic grade and differentiation: G3: poorly differentiated/high grade. Type of polyp in which invasive carcinoma arose: A definitive precursor polyp is not identified. Microscopic extension of invasive tumor: Tumor invades through muscularis propria to involve subserosal soft tissues. Lymph-Vascular invasion: Definitive lymph/vascular invasion is not identified. Peri-neural invasion: Not identified. Tumor deposit(s) (discontinuous extramural extension): No tumor deposits showing a high grade poorly differentiated adenocarcinoma are identified. Resection margins: Proximal margin: Negative. Distal margin: Negative. Circumferential (radial) (posterior ascending, posterior descending; lateral and posterior mid-rectum; and entire  lower 1/3 rectum): Negative. Distance closest margin (if all above margins negative): 4.5 cm (radial margin). Treatment effect (neo-adjuvant therapy): Not applicable. Additional polyp(s): Three additional tubular adenomas are present in the right colon specimen. Non-neoplastic findings: Benign unremarkable appendix. Lymph nodes: number examined: 16 (from the right colon specimen); number positive: 0. Pathologic Staging: pT3, pN0. Ancillary studies: The tumor will be sent for MMR by [place] and MSI by PCR. 2. COLON AND RECTUM: Specimen: Right partial colon (sigmoid colon). Procedure: Right partial colectomy (sigmoid resection). Tumor site: Sigmoid colon (distal aspect of the specimen along the posterior aspect). Specimen integrity: Intact. Macroscopic tumor perforation: Not identified. Invasive tumor: Maximum size: 5.5 cm. Histologic type(s): Adenocarcinoma with abundant extracellular mucin. Histologic grade and differentiation: G1: well differentiated/low grade. Type of polyp in which invasive carcinoma arose: Tumor arose from a tubular adenoma with high grade glandular dysplasia. Microscopic extension of invasive tumor: Tumor invades through muscularis propria to involve subserosal soft tissue. Lymph-Vascular invasion: Definitive lymph/vascular invasion is not identified, however there is a tumor deposit  present, see below. Peri-neural invasion: Not identified. Tumor deposit(s) (discontinuous extramural extension): Yes, tumor deposit is present. Resection margins: Proximal margin: Negative. Distal margin: Negative. Circumferential (radial) (posterior ascending, posterior descending; lateral and posterior mid-rectum; and entire lower 1/3 rectum): 3.1 cm. Distance closest margin (if all above margins negative): 2.4 cm (distal margin). Treatment effect (neo-adjuvant therapy): Not applicable. Additional polyp(s): Benign polypoid colorectal mucosa with hyperplastic-type change (possibly  representing early hyperplastic polyp formation) is present. Non-neoplastic findings: No additional non-neoplastic findings Lymph nodes: number examined: 10 (from the sigmoid colon specimen); number positive: 0, see comment. Pathologic Staging: pT3, pN1c. Ancillary studies: The tumor will be sent for MMR by IHC and MSI by PCR per colorectal cancer protocol. Comment: Initially, only 7 lymph nodes are identified in the sigmoid colon specimen. The specimen is placed in fat clearing solution with an additional three lymph nodes identified on second gross assessment. (RH:kh 02-19-15)  Mismatch Repair (MMR) Protein Immunohistochemistry (IHC) IHC Expression Result: MLH1: LOSS OF NUCLEAR EXPRESSION (LESS THAN 5% TUMOR EXPRESSION) MSH2: Preserved nuclear expression (greater 50% tumor expression) MSH6: Preserved nuclear expression (greater 50% tumor expression) PMS2: LOSS OF NUCLEAR EXPRESSION (LESS THAN 5% TUMOR EXPRESSION) * Internal control demonstrates intact nuclear expression Interpretation: ABNORMAL There is loss of the major and minor MMR proteins MLH1 and PMS2. The loss of expression may be secondary to promoter hyper-methylation, gene mutation or other genetic event. BRAF mutation testing and/or MLH1 methylation testing is indicated. The presence of a BRAF mutation and/or MLH1 hyper-methylation is indicative of a sporadic-type tumor. The absence of either BRAF mutation and/or presence of normal-methylation indicate the possible presence of a hereditary germline mutation (e.g. Lynch syndrome) and referral to genetic counseling is warranted. It is recommended that the loss of protein expression be correlated with molecular based MSI testing.  2. Mismatch Repair (MMR) Protein Immunohistochemistry (IHC) IHC Expression Result: MLH1: Preserved nuclear expression (greater 50% tumor expression) MSH2: Preserved nuclear expression (greater 50% tumor expression) MSH6: Preserved nuclear expression  (greater 50% tumor expression) PMS2: Preserved nuclear expression (greater 50% tumor expression) * Internal control demonstrates intact nuclear expression Interpretation: NORMAL         RADIOGRAPHIC STUDIES: I have personally reviewed the radiological images as listed and agreed with the findings in the report. No results found.   CT CAP W Contrast 10/09/16 IMPRESSION: 1. No evidence of local tumor recurrence at the ileocolic or distal colonic anastomoses. 2. No evidence of metastatic disease in the chest, abdomen or pelvis. 3. Right adrenal nodule is stable back to 12/19/2014, most compatible with a benign adenoma. 4. Aortic Atherosclerosis (ICD10-I70.0). Two-vessel coronary atherosclerosis. 5. Small hiatal hernia.   ASSESSMENT & PLAN: 77 y.o. African-American female with past medical history of hypertension, presented with fatigue, anemia and dizziness.  1. Right colon cancer, pT3N0M0 stage IIA, poorly differentiated,MSI-high, and sigmoid-rectal colon cancer, pT3N1cM0 stage IIIB, well differentiated,MSI-stable  Ms. Ledesma appears stable, she is clinically doing well. She completed adjuvant chemo with Xeloda from 2/17-7/17. Surveillance screenings have been negative for recurrence. VS and weight stable; labs unremarkable. CEA pending. Physical exam unremarkable; no clinical concern for recurrence. Will continue surveillance. Next colonoscopy due 12/2015 per Dr. Oletta Lamas. Annual surveillance CT due 10/2017, ordered today. F/u in 6 months few days after CT.   2. HTN Not currently on anti-hypertensive; BP 106/76 today. She continues f/u with PCP, next visit 04/2017  3. Hand-foot syndrome Secondary to Xeloda, resolved. Hands appear mildly hyperpigmented, no sensitivity or neuropathy  4. Routine health screening I encouraged  her to continue routine cancer screenings. She is not interested in scheduling mammogram at this time. Last colonoscopy done 12/2015, a polyp was removed.  Next due 12/2018 per Dr. Oletta Lamas.   5. Hyperglycemia Nonfasting BG elevated 162 today, I encouraged her to eat well and remain active. Will f/u with PCP this month.  PLAN: Labs reviewed, no concern for recurrence; continue surveillance F/u in 6 months, CT few days prior   Orders Placed This Encounter  Procedures  . CT Abdomen Pelvis W Contrast    Standing Status:   Future    Standing Expiration Date:   04/17/2018    Order Specific Question:   If indicated for the ordered procedure, I authorize the administration of contrast media per Radiology protocol    Answer:   Yes    Order Specific Question:   Preferred imaging location?    Answer:   [facility]    Order Specific Question:   Radiology Contrast Protocol - do NOT remove file path    Answer:   [email or web address]    Order Specific Question:   Reason for Exam additional comments    Answer:   Status post laparoscopic right colectomy and sigmoidectomy, s/p adjuvant chemo for stage III colon cancer; surveillance  . CT Chest W Contrast    Standing Status:   Future    Standing Expiration Date:   04/17/2018    Order Specific Question:   If indicated for the ordered procedure, I authorize the administration of contrast media per Radiology protocol    Answer:   Yes    Order Specific Question:   Preferred imaging location?    Answer:   [facility]    Order Specific Question:   Radiology Contrast Protocol - do NOT remove file path    Answer:   [email or web address]    Order Specific Question:   Reason for Exam additional comments    Answer:   Status post laparoscopic right colectomy and sigmoidectomy, s/p adjuvant chemo for stage III colon cancer; surveillance   All questions were answered. The patient knows to call the clinic with any problems, questions or concerns. No barriers to learning was detected.    Alla Feeling, NP 04/17/17

## 2017-04-20 ENCOUNTER — Other Ambulatory Visit: Payer: Self-pay | Admitting: Hematology

## 2017-04-20 DIAGNOSIS — C182 Malignant neoplasm of ascending colon: Secondary | ICD-10-CM

## 2017-10-09 ENCOUNTER — Inpatient Hospital Stay: Payer: Medicare Other | Attending: Hematology

## 2017-10-09 DIAGNOSIS — C182 Malignant neoplasm of ascending colon: Secondary | ICD-10-CM | POA: Diagnosis present

## 2017-10-09 DIAGNOSIS — C772 Secondary and unspecified malignant neoplasm of intra-abdominal lymph nodes: Secondary | ICD-10-CM

## 2017-10-09 DIAGNOSIS — I10 Essential (primary) hypertension: Secondary | ICD-10-CM | POA: Insufficient documentation

## 2017-10-09 DIAGNOSIS — C787 Secondary malignant neoplasm of liver and intrahepatic bile duct: Secondary | ICD-10-CM | POA: Diagnosis not present

## 2017-10-09 DIAGNOSIS — Z87891 Personal history of nicotine dependence: Secondary | ICD-10-CM | POA: Insufficient documentation

## 2017-10-09 DIAGNOSIS — C187 Malignant neoplasm of sigmoid colon: Secondary | ICD-10-CM

## 2017-10-09 DIAGNOSIS — E119 Type 2 diabetes mellitus without complications: Secondary | ICD-10-CM | POA: Insufficient documentation

## 2017-10-09 DIAGNOSIS — Z7984 Long term (current) use of oral hypoglycemic drugs: Secondary | ICD-10-CM | POA: Diagnosis not present

## 2017-10-09 LAB — COMPREHENSIVE METABOLIC PANEL
ALT: 10 U/L (ref 0–44)
ANION GAP: 10 (ref 5–15)
AST: 17 U/L (ref 15–41)
Albumin: 3.7 g/dL (ref 3.5–5.0)
Alkaline Phosphatase: 56 U/L (ref 38–126)
BUN: 8 mg/dL (ref 8–23)
CHLORIDE: 106 mmol/L (ref 98–111)
CO2: 26 mmol/L (ref 22–32)
CREATININE: 0.82 mg/dL (ref 0.44–1.00)
Calcium: 9.5 mg/dL (ref 8.9–10.3)
GFR calc non Af Amer: >60 mL/min (ref >60)
Glucose, Bld: 84 mg/dL (ref 70–99)
Potassium: 4.2 mmol/L (ref 3.5–5.1)
SODIUM: 142 mmol/L (ref 135–145)
Total Bilirubin: 0.5 mg/dL (ref 0.3–1.2)
Total Protein: 7.3 g/dL (ref 6.5–8.1)

## 2017-10-09 LAB — CBC WITH DIFFERENTIAL/PLATELET
BASOS ABS: 0 10*3/uL (ref 0.0–0.1)
Basophils Relative: 0 %
EOS ABS: 0.3 10*3/uL (ref 0.0–0.5)
EOS PCT: 3 %
HCT: 41.9 % (ref 34.8–46.6)
Hemoglobin: 13.3 g/dL (ref 11.6–15.9)
LYMPHS ABS: 2.1 10*3/uL (ref 0.9–3.3)
Lymphocytes Relative: 26 %
MCH: 26.2 pg (ref 25.1–34.0)
MCHC: 31.7 g/dL (ref 31.5–36.0)
MCV: 82.6 fL (ref 79.5–101.0)
Monocytes Absolute: 0.4 10*3/uL (ref 0.1–0.9)
Monocytes Relative: 5 %
Neutro Abs: 5.2 10*3/uL (ref 1.5–6.5)
Neutrophils Relative %: 66 %
PLATELETS: 332 10*3/uL (ref 145–400)
RBC: 5.07 MIL/uL (ref 3.70–5.45)
RDW: 15.7 % — ABNORMAL HIGH (ref 11.2–14.5)
WBC: 8 10*3/uL (ref 3.9–10.3)

## 2017-10-09 LAB — CEA (IN HOUSE-CHCC): CEA (CHCC-In House): <1 ng/mL (ref 0.00–5.00)

## 2017-10-12 ENCOUNTER — Encounter (HOSPITAL_COMMUNITY): Payer: Self-pay

## 2017-10-12 ENCOUNTER — Ambulatory Visit (HOSPITAL_COMMUNITY)
Admission: RE | Admit: 2017-10-12 | Discharge: 2017-10-12 | Disposition: A | Discharge status: Home / Self Care | Payer: Medicare Other | Source: Ambulatory Visit | Attending: Nurse Practitioner | Admitting: Nurse Practitioner

## 2017-10-12 DIAGNOSIS — Z9049 Acquired absence of other specified parts of digestive tract: Secondary | ICD-10-CM | POA: Diagnosis not present

## 2017-10-12 DIAGNOSIS — I700 Atherosclerosis of aorta: Secondary | ICD-10-CM | POA: Diagnosis not present

## 2017-10-12 DIAGNOSIS — C772 Secondary and unspecified malignant neoplasm of intra-abdominal lymph nodes: Secondary | ICD-10-CM | POA: Insufficient documentation

## 2017-10-12 DIAGNOSIS — C187 Malignant neoplasm of sigmoid colon: Secondary | ICD-10-CM | POA: Insufficient documentation

## 2017-10-12 DIAGNOSIS — Z9221 Personal history of antineoplastic chemotherapy: Secondary | ICD-10-CM | POA: Diagnosis not present

## 2017-10-12 MED ORDER — IOHEXOL 300 MG/ML  SOLN
100.0000 mL | Freq: Once | INTRAMUSCULAR | Status: AC | PRN
  Administered 2017-10-12: 100 mL via INTRAVENOUS

## 2017-10-12 NOTE — Progress Notes (Signed)
Olivia Jackson  Telephone:([phone]) [phone] Fax:([phone]) [phone]  Clinic Follow Up Note   Patient Care Team: Alroy Dust, L.Marlou Sa, MD as PCP - General (Family Medicine) Leighton Ruff, MD as Consulting Physician (General Surgery) Truitt Merle, MD as Consulting Physician (Hematology) 10/15/2017    CHIEF COMPLAINTS:  Follow up stage III colon cancer  Oncology History   Cancer of right colon [facility])   Staging form: Colon and Rectum, AJCC 7th Edition     Pathologic stage from 02/15/2015: Stage IIA (T3, N0, cM0) - Signed by Truitt Merle, MD on 03/23/2015 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 Colonoscopy showed a ulcerated completely obstructing large mass in the rectosigmoid colon, 15 cm from anus. Diverticulosis in the rectosigmoid colon. Biopsy was taken from the mass.    12/29/2014 Imaging    CT chest, abdomen and pelvis showed a circumferential mass lesion in the proximal right colon above the ileocecal valve consistent with carcinoma of the colon. No adenopathy. No distant metastasis.    02/15/2015 Initial Diagnosis    Cancer of right colon ([place])    02/15/2015 Surgery    Laparoscopic right colectomy and sigmoidectomy    02/15/2015 Pathology Results    Right colon segmental resection showed a invasive poorly differentiated adenocarcinoma, spanning 8 cm, T3, margins were negative, 16 lymph nodes. No lymphovascular invasion, no perineural invasion. MMR showed loss of expression of ML H1 and PMS2, MSI-H    02/15/2015 Pathology Results    Sigmoid colon segmental resection showed invasive well differentiated adenocarcinoma, 5.5 cm, T3, 10 lymph nodes were negative, (+) tumor deposit. margins were negative     04/04/2015 -  09/21/2015 Chemotherapy    Adjuvant Xeloda 1500 mg bid 14 days on-7 days off, s/p 8 cycles     10/26/2015 Imaging    Surveillance CT chest, abdomen and pelvis with contrast showed no evidence of recurrence, 13 mm right adrenal nodule, unchanged since 2016, favor benign adrenal adenoma.    10/09/2016 Imaging    CT CAP W Contrast 10/09/16 IMPRESSION: 1. No evidence of local tumor recurrence at the ileocolic or distal colonic anastomoses. 2. No evidence of metastatic disease in the chest, abdomen or pelvis. 3. Right adrenal nodule is stable back to 12/19/2014, most compatible with a benign adenoma. 4. Aortic Atherosclerosis (ICD10-I70.0). Two-vessel coronary atherosclerosis. 5. Small hiatal hernia.     10/12/2017 Imaging    10/12/2017 CT CAP IMPRESSION: 1. No evidence of tumor recurrence or metastatic disease within the chest, abdomen or pelvis. 2. No acute abnormalities. 3.  Aortic Atherosclerosis (ICD10-I70.0).     HISTORY OF PRESENTING ILLNESS (03/23/2015):  Olivia Jackson 77 y.o. female is here because of her recently diagnosed colon cancer. She is accompanied by her husband to our multidisciplinary GI clinic today.  She presented with fatigue and dizzines in 10/2014, she also had decreased appetite, mild intermittent blood in stool, moderate constipation, and weight loss about 30lbs. She was seen by her primary care physician, was found to be anemic, and required blood transfusion in October 2016. She was referred to gastroenterologist Dr. Oletta Lamas, and underwent EGD and colonoscopy on 12/21/2014, which showed a ulcerated mass in the sigmoid-rectum, 15 cm from  anal verge, in the scope was not able to advance. Multiple biopsy from the sigmoid colon mass showed invasive adenocarcinoma. CT of the chest abdomen and pelvis was obtained, which showed a circumferential mass lesion in the proximal right colon above the ileocecal valve. She was referred to colorectal surgeon Dr. Marcello Moores, and  underwent left scopic right colectomy and sigmoidectomy on 02/15/2015. She was discharged home 5 days after her surgery.  She has revoered very well from her surgery. She has greast appetite, eats well, normal BM, takes miralax as needed, no pain gained 6-7 lbs weight back since the surgery. She has good energy level, and remains to be physically active at home.  She has no children, no family history of colon cancer or other malignancy except her father had brain tumor.  CURRENT THERAPY: Surveillance  INTERIM HISTORY:  Olivia Jackson returns for follow-up. She is here alone. She states that she started taking Metformin for her DM now. She is measuring her BG at home and is being careful with diet. She is losing weight and is happy about it. She denies abdominal pain or BM changes.  She is very happy about her results.   MEDICAL HISTORY:  Past Medical History:  Diagnosis Date  . Anemia   . Cancer ([place]) 2016   colon ca  . Falls    pt states fell twice this week   . History of blood transfusion    12/2014  . Hypertension     SURGICAL HISTORY: Past Surgical History:  Procedure Laterality Date  . colonscopy     . DILATION AND CURETTAGE OF UTERUS    . LAPAROSCOPIC PARTIAL COLECTOMY N/A 02/15/2015   Procedure: LAPAROSCOPIC RIGHT COLECTOMY AND SIGMOIDECTOMY PROCTOSCOPY;  Surgeon: Leighton Ruff, MD;  Location: [facility];  Service: General;  Laterality: N/A;  . UPPER GI ENDOSCOPY    . UPPER GI ENDOSCOPY  02/15/2015   Procedure: UPPER GI ENDOSCOPY;  Surgeon: Leighton Ruff, MD;  Location: [facility];  Service: General;;    SOCIAL HISTORY: Social History   Socioeconomic History  . Marital status: Married    Spouse name: Not on file  . Number of children: Not on file  . Years of education: Not on file  . Highest education level: Not on file  Occupational History  . Not on file  Social Needs  . Financial resource strain: Not on file  . Food insecurity:    Worry: Not on file    Inability: Not  on file  . Transportation needs:    Medical: Not on file    Non-medical: Not on file  Tobacco Use  . Smoking status: Former Smoker    Packs/day: 0.50    Years: 20.00    Pack years: 10.00    Types: Cigarettes    Last attempt to quit: 03/04/1995    Years since quitting: 22.6  . Smokeless tobacco: Never Used  Substance and Sexual Activity  . Alcohol use: No  . Drug use: No  . Sexual activity: Not on file  Lifestyle  . Physical activity:    Days per week: Not on file    Minutes per session: Not on file  . Stress: Not on file  Relationships  . Social connections:    Talks on phone: Not on file    Gets together: Not on file    Attends religious service: Not on file    Active member of club or organization: Not on file    Attends meetings of clubs or  organizations: Not on file    Relationship status: Not on file  . Intimate partner violence:    Fear of current or ex partner: Not on file    Emotionally abused: Not on file    Physically abused: Not on file    Forced sexual activity: Not on file  Other Topics Concern  . Not on file  Social History Narrative   Married, husband Olivia Jackson   No children   Independent ADLs, drives    FAMILY HISTORY: Family History  Problem Relation Age of Onset  . Cancer Father 39       brain tumor     ALLERGIES:  has No Known Allergies.  MEDICATIONS:  Current Outpatient Medications  Medication Sig Dispense Refill  . acetaminophen (TYLENOL) 325 MG tablet Take 2 tablets (650 mg total) by mouth every 6 (six) hours as needed.    . metFORMIN (GLUCOPHAGE) 500 MG tablet Take 500 mg by mouth daily.    . polyethylene glycol (MIRALAX / GLYCOLAX) packet Take 17 g by mouth daily as needed for mild constipation, moderate constipation or severe constipation.    . urea (CARMOL) 10 % cream Apply topically as needed. 71 g 0   No current facility-administered medications for this visit.     REVIEW OF SYSTEMS:   Constitutional: Denies fevers, chills or  abnormal night sweats (+) losing weight Eyes: Denies blurriness of vision, double vision or watery eyes Ears, nose, mouth, throat, and face: Denies mucositis or sore throat Respiratory: Denies cough, dyspnea or wheezes Cardiovascular: Denies palpitation, chest discomfort or lower extremity swelling Gastrointestinal:  Denies nausea, heartburn or change in bowel habits Skin: Denies abnormal skin rashes Lymphatics: Denies new lymphadenopathy or easy bruising Neurological:Denies numbness, tingling or new weaknesses Behavioral/Psych: Mood is stable, no new changes  All other systems were reviewed with the patient and are negative.  PHYSICAL EXAMINATION: ECOG PERFORMANCE STATUS: 0  Vitals:   10/15/17 0932  BP: 95/72  Pulse: 70  Resp: 18  Temp: 97.6 F (36.4 C)  SpO2: 100%   Filed Weights   10/15/17 0932  Weight: 146 lb 9.6 oz (66.5 kg)     GENERAL:alert, no distress and comfortable SKIN: skin color, texture, turgor are normal, no rashes or significant lesions (+) skin pigmentation on hands near resolved EYES: normal, conjunctiva are pink and non-injected, sclera clear OROPHARYNX:no exudate, no erythema and lips, buccal mucosa, and tongue normal  NECK: supple, thyroid normal size, non-tender, without nodularity LYMPH:  no palpable lymphadenopathy in the cervical, axillary or inguinal LUNGS: clear to auscultation and percussion with normal breathing effort HEART: regular rate & rhythm and no murmurs and no lower extremity edema ABDOMEN:abdomen soft, non-tender and normal bowel sounds. Midline surgical scar and laparoscopic surgical scars are all well healed, no skin erythema or discharge. Musculoskeletal:no cyanosis of digits and no clubbing  PSYCH: alert & oriented x 3 with fluent speech NEURO: no focal motor/sensory deficits  LABORATORY DATA:  I have reviewed the data as listed CBC Latest Ref Rng & Units 10/09/2017 04/17/2017 10/09/2016  WBC 3.9 - 10.3 K/uL 8.0 9.4 10.0  Hemoglobin  11.6 - 15.9 g/dL 13.3 13.4 13.8  Hematocrit 34.8 - 46.6 % 41.9 41.5 42.4  Platelets 145 - 400 K/uL 332 343 358    Recent Labs    04/17/17 0838 10/09/17 0837  NA 140 142  K 3.6 4.2  CL 103 106  CO2 26 26  GLUCOSE 162* 84  BUN 8 8  CREATININE 0.86 0.82  CALCIUM 9.7 9.5  GFRNONAA >60 >60  GFRAA >60 >60  PROT 7.6 7.3  ALBUMIN 3.6 3.7  AST 22 17  ALT 14 10  ALKPHOS 77 56  BILITOT 0.4 0.5    Tumor Markers Results for AILEANA, HODDER (MRN [id number]) as of 10/12/2017 11:31  Ref. Range 02/01/2016 08:41 06/16/2016 08:53 10/09/2016 10:57 04/17/2017 08:38 10/09/2017 08:37  CEA Latest Ref Range: 0.0 - 4.7 ng/mL 3.3 2.8     CEA ([facility]) Latest Ref Range: 0.00 - 5.00 ng/mL 1.61 1.19 1.04 <1.00 <1.00    PATHOLOGY REPORT  Diagnosis 02/15/2015 1. Colon, segmental resection for tumor, right - INVASIVE POORLY DIFFERENTIATED ADENOCARCINOMA, SPANNING 8 CM IN GREATEST DIMENSION. - TUMOR INVADES THROUGH MUSCULARIS PROPRIA TO INVOLVE SUBSEROSAL SOFT TISSUES. - MARGIN IS NEGATIVE. - SIXTEEN BENIGN LYMPH NODES WITH NO TUMOR SEEN (0/16). - SEE ONCOLOGY TEMPLATE. 2. Colon, segmental resection for tumor, sigmoid - INVASIVE WELL DIFFERENTIATED ADENOCARCINOMA WITH ABUNDANT EXTRACELLULAR MUCIN, SPANNING 5.5 CM IN GREATEST DIMENSION. - TUMOR INVADES THROUGH MUSCULARIS PROPRIA TO INVOLVE SUBSEROSAL SOFT TISSUES. - MARGINS ARE NEGATIVE. - SATELLITE TUMOR DEPOSIT (DISCONTINUOUS EXTRAMURAL EXTENSION) IS PRESENT. - TEN BENIGN LYMPH NODES WITH NO TUMOR SEEN (0/10). - SEE ONCOLOGY TEMPLATE BELOW. 3. Colon, resection margin (donut), distal anastomotic ring - BENIGN COLORECTAL MUCOSA. - NO TUMOR SEEN.  Microscopic Comment 1. COLON AND RECTUM: Specimen: Right colon. Procedure: Right hemicolectomy. Tumor site: Ascending colon. Specimen integrity: Intact. Macroscopic tumor perforation: Not identified. Invasive tumor: Maximum size: 8 cm. Histologic type(s): Adenocarcinoma. Histologic grade and  differentiation: G3: poorly differentiated/high grade. Type of polyp in which invasive carcinoma arose: A definitive precursor polyp is not identified. Microscopic extension of invasive tumor: Tumor invades through muscularis propria to involve subserosal soft tissues. Lymph-Vascular invasion: Definitive lymph/vascular invasion is not identified. Peri-neural invasion: Not identified. Tumor deposit(s) (discontinuous extramural extension): No tumor deposits showing a high grade poorly differentiated adenocarcinoma are identified. Resection margins: Proximal margin: Negative. Distal margin: Negative. Circumferential (radial) (posterior ascending, posterior descending; lateral and posterior mid-rectum; and entire lower 1/3 rectum): Negative. Distance closest margin (if all above margins negative): 4.5 cm (radial margin). Treatment effect (neo-adjuvant therapy): Not applicable. Additional polyp(s): Three additional tubular adenomas are present in the right colon specimen. Non-neoplastic findings: Benign unremarkable appendix. Lymph nodes: number examined: 16 (from the right colon specimen); number positive: 0. Pathologic Staging: pT3, pN0. Ancillary studies: The tumor will be sent for MMR by [place] and MSI by PCR. 2. COLON AND RECTUM: Specimen: Right partial colon (sigmoid colon). Procedure: Right partial colectomy (sigmoid resection). Tumor site: Sigmoid colon (distal aspect of the specimen along the posterior aspect). Specimen integrity: Intact. Macroscopic tumor perforation: Not identified. Invasive tumor: Maximum size: 5.5 cm. Histologic type(s): Adenocarcinoma with abundant extracellular mucin. Histologic grade and differentiation: G1: well differentiated/low grade. Type of polyp in which invasive carcinoma arose: Tumor arose from a tubular adenoma with high grade glandular dysplasia. Microscopic extension of invasive tumor: Tumor invades through muscularis propria to involve subserosal  soft tissue. Lymph-Vascular invasion: Definitive lymph/vascular invasion is not identified, however there is a tumor deposit present, see below. Peri-neural invasion: Not identified. Tumor deposit(s) (discontinuous extramural extension): Yes, tumor deposit is present. Resection margins: Proximal margin: Negative. Distal margin: Negative. Circumferential (radial) (posterior ascending, posterior descending; lateral and posterior mid-rectum; and entire lower 1/3 rectum): 3.1 cm. Distance closest margin (if all above margins negative): 2.4 cm (distal margin). Treatment effect (neo-adjuvant therapy): Not applicable. Additional polyp(s): Benign polypoid colorectal mucosa with hyperplastic-type change (possibly representing early hyperplastic  polyp formation) is present. Non-neoplastic findings: No additional non-neoplastic findings Lymph nodes: number examined: 10 (from the sigmoid colon specimen); number positive: 0, see comment. Pathologic Staging: pT3, pN1c. Ancillary studies: The tumor will be sent for MMR by IHC and MSI by PCR per colorectal cancer protocol. Comment: Initially, only 7 lymph nodes are identified in the sigmoid colon specimen. The specimen is placed in fat clearing solution with an additional three lymph nodes identified on second gross assessment. (RH:kh 02-19-15)  Mismatch Repair (MMR) Protein Immunohistochemistry (IHC) IHC Expression Result: MLH1: LOSS OF NUCLEAR EXPRESSION (LESS THAN 5% TUMOR EXPRESSION) MSH2: Preserved nuclear expression (greater 50% tumor expression) MSH6: Preserved nuclear expression (greater 50% tumor expression) PMS2: LOSS OF NUCLEAR EXPRESSION (LESS THAN 5% TUMOR EXPRESSION) * Internal control demonstrates intact nuclear expression Interpretation: ABNORMAL There is loss of the major and minor MMR proteins MLH1 and PMS2. The loss of expression may be secondary to promoter hyper-methylation, gene mutation or other genetic event. BRAF mutation  testing and/or MLH1 methylation testing is indicated. The presence of a BRAF mutation and/or MLH1 hyper-methylation is indicative of a sporadic-type tumor. The absence of either BRAF mutation and/or presence of normal-methylation indicate the possible presence of a hereditary germline mutation (e.g. Lynch syndrome) and referral to genetic counseling is warranted. It is recommended that the loss of protein expression be correlated with molecular based MSI testing.  2. Mismatch Repair (MMR) Protein Immunohistochemistry (IHC) IHC Expression Result: MLH1: Preserved nuclear expression (greater 50% tumor expression) MSH2: Preserved nuclear expression (greater 50% tumor expression) MSH6: Preserved nuclear expression (greater 50% tumor expression) PMS2: Preserved nuclear expression (greater 50% tumor expression) * Internal control demonstrates intact nuclear expression Interpretation: NORMAL       RADIOGRAPHIC STUDIES: I have personally reviewed the radiological images as listed and agreed with the findings in the report.  10/12/2017 CT CAP IMPRESSION: 1. No evidence of tumor recurrence or metastatic disease within the chest, abdomen or pelvis. 2. No acute abnormalities. 3.  Aortic Atherosclerosis (ICD10-I70.0).  CT CAP W Contrast 10/09/16 IMPRESSION: 1. No evidence of local tumor recurrence at the ileocolic or distal colonic anastomoses. 2. No evidence of metastatic disease in the chest, abdomen or pelvis. 3. Right adrenal nodule is stable back to 12/19/2014, most compatible with a benign adenoma. 4. Aortic Atherosclerosis (ICD10-I70.0). Two-vessel coronary atherosclerosis. 5. Small hiatal hernia.   CT chest, abdomen and pelvis w contrast  10/26/2015 IMPRESSION: Status post right hemicolectomy and sigmoidectomy.  No findings suspicious for recurrent or metastatic disease.  13 mm right adrenal nodule, technically indeterminate, although unchanged since 2016 and favored to  reflect a benign adrenal adenoma.  Additional ancillary findings as above.   ASSESSMENT & PLAN:  77 y.o. African-American female with past medical history of hypertension, presented with fatigue, anemia and dizziness.  1. Right colon cancer, pT3N0M0 stage IIA, poorly differentiated,MSI-high, and sigmoid-rectal colon cancer, pT3N1cM0 stage IIIB, well differentiated,MSI-stable  -I previously reviewed her surgical pathology findings with patient and her husband in great details. She has 2 multifocal colon adenocarcinoma was distinct features. Her right colon cancer is stage IIA, poorly differentiated, MSI high, has P ref mutation and MLH-1 hypervascular mass lesion, which supports a sporadic colon cancer, unlikely Lynch syndrome. 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 -Her staging CT scan was negative for distant metastasis. Her pre-op CEA was elevated at 7.5, post op CEA came down to 1.7 (normal) -She has completed adjuvant chemotherapy Xeloda, tolerated very well without significant  side effects  -I previously reviewed her surveillance CT scan from 10/22/2015, which showed no evidence of recurrence -Her repeated colonoscopy in October 2017 was unremarkable, down by Dr. Oletta Lamas. Repeat in 3 years. -She is clinically doing very well, laboratory reviewed, exam was unremarkable, no clinical concern for recurrence. -I reviewed her recent surveillance CT scan from October 12, 2017, showed no evidence of recurrence. -She is clincally doing well, her CBC and CMP and CEA are within normal limits.  -She is about 3 years out from diagnosis. Will continue colon cancer surveillance with lab, f/u and exam every 6 months. -I encouraged her to continue healthy diet and exercise.  2. Hypertension -She'll continue follow-up with her primary care physician -I encouraged her to drink more water, avoid dehydration -She monitors her blood  pressure at home -She is not on antihypertension medication  3. DM   -She has recently started metformin, her blood glucose improved.  4. Cancer screening -I encouraged the patient to resume annual screening mammograms  Plan -Scan reviewed with her, no evidence of recurrence. Continue colon cancer surveillance  -Lab and f/u in 6 months    All questions were answered. The patient knows to call the clinic with any problems, questions or concerns. I spent 20 minutes counseling the patient face to face. The total time spent in the appointment was 25 minutes and more than 50% was on counseling.  Dierdre Searles Dweik am acting as scribe for Dr. Truitt Merle.  I have reviewed the above documentation for accuracy and completeness, and I agree with the above.    Truitt Merle, MD 10/15/2017

## 2017-10-14 DIAGNOSIS — C187 Malignant neoplasm of sigmoid colon: Secondary | ICD-10-CM | POA: Insufficient documentation

## 2017-10-14 DIAGNOSIS — C772 Secondary and unspecified malignant neoplasm of intra-abdominal lymph nodes: Secondary | ICD-10-CM

## 2017-10-15 ENCOUNTER — Other Ambulatory Visit: Payer: Medicare Other

## 2017-10-15 ENCOUNTER — Telehealth: Payer: Self-pay

## 2017-10-15 ENCOUNTER — Encounter: Payer: Self-pay | Admitting: Hematology

## 2017-10-15 ENCOUNTER — Inpatient Hospital Stay (HOSPITAL_BASED_OUTPATIENT_CLINIC_OR_DEPARTMENT_OTHER): Payer: Medicare Other | Admitting: Hematology

## 2017-10-15 VITALS — BP 95/72 | HR 70 | Temp 97.6°F | Resp 18 | Ht 62.0 in | Wt 146.6 lb

## 2017-10-15 DIAGNOSIS — E119 Type 2 diabetes mellitus without complications: Secondary | ICD-10-CM

## 2017-10-15 DIAGNOSIS — Z7984 Long term (current) use of oral hypoglycemic drugs: Secondary | ICD-10-CM

## 2017-10-15 DIAGNOSIS — C772 Secondary and unspecified malignant neoplasm of intra-abdominal lymph nodes: Secondary | ICD-10-CM | POA: Diagnosis not present

## 2017-10-15 DIAGNOSIS — C187 Malignant neoplasm of sigmoid colon: Secondary | ICD-10-CM

## 2017-10-15 DIAGNOSIS — I10 Essential (primary) hypertension: Secondary | ICD-10-CM

## 2017-10-15 DIAGNOSIS — C182 Malignant neoplasm of ascending colon: Secondary | ICD-10-CM

## 2017-10-15 NOTE — Telephone Encounter (Signed)
Printed avs and calender of upcoming appointment. Per 8/1 los 

## 2018-04-13 NOTE — Progress Notes (Signed)
[name]   [phone] [phone]) [phone]   Clinic Follow up Note   Patient Care Team: Alroy Dust, L.Marlou Sa, MD as PCP - General (Family Medicine) Leighton Ruff, MD as Consulting Physician (General Surgery) Truitt Merle, MD as Consulting Physician (Hematology) 04/15/2018  CHIEF COMPLAINT: F/u stage III colon cancer  SUMMARY OF ONCOLOGIC HISTORY: Oncology History   Cancer of right colon [facility])   Staging form: Colon and Rectum, AJCC 7th Edition     Pathologic stage from 02/15/2015: Stage IIA (T3, N0, cM0) - Signed by Truitt Merle, MD on 03/23/2015 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)   Staging form: Colon and Rectum, AJCC 7th Edition     Pathologic stage from 02/15/2015: Stage IIIB (T3, N1c, cM0) - Signed by Truitt Merle, MD on 03/23/2015        Cancer of right colon ([place])   12/21/2014 Procedure    EGD was normal. Colonoscopy showed a ulcerated completely obstructing large mass in the rectosigmoid colon, 15 cm from anus. Diverticulosis in the rectosigmoid colon. Biopsy was taken from the mass.    12/29/2014 Imaging    CT chest, abdomen and pelvis showed a circumferential mass lesion in the proximal right colon above the ileocecal valve consistent with carcinoma of the colon. No adenopathy. No distant metastasis.    02/15/2015 Initial Diagnosis    Cancer of right colon ([place])    02/15/2015 Surgery    Laparoscopic right colectomy and sigmoidectomy    02/15/2015 Pathology Results    Right colon segmental resection showed a invasive poorly differentiated adenocarcinoma, spanning 8 cm, T3, margins were negative, 16 lymph nodes. No lymphovascular invasion, no perineural invasion. MMR showed loss of expression of ML H1 and PMS2, MSI-H    02/15/2015 Pathology Results    Sigmoid colon segmental resection showed invasive well differentiated adenocarcinoma, 5.5 cm, T3, 10 lymph nodes were negative, (+) tumor deposit. margins were negative       04/04/2015 - 09/21/2015 Chemotherapy    Adjuvant Xeloda 1500 mg bid 14 days on-7 days off, s/p 8 cycles     10/26/2015 Imaging    Surveillance CT chest, abdomen and pelvis with contrast showed no evidence of recurrence, 13 mm right adrenal nodule, unchanged since 2016, favor benign adrenal adenoma.    10/09/2016 Imaging    CT CAP W Contrast 10/09/16 IMPRESSION: 1. No evidence of local tumor recurrence at the ileocolic or distal colonic anastomoses. 2. No evidence of metastatic disease in the chest, abdomen or pelvis. 3. Right adrenal nodule is stable back to 12/19/2014, most compatible with a benign adenoma. 4. Aortic Atherosclerosis (ICD10-I70.0). Two-vessel coronary atherosclerosis. 5. Small hiatal hernia.     10/12/2017 Imaging    10/12/2017 CT CAP IMPRESSION: 1. No evidence of tumor recurrence or metastatic disease within the chest, abdomen or pelvis. 2. No acute abnormalities. 3.  Aortic Atherosclerosis (ICD10-I70.0).     CURRENT THERAPY  Surveillance   INTERVAL HISTORY: Olivia Jackson is a 78 y.o. female who is here for follow-up. Today, she is here by herself.  She is doing very well, denies any pain, abdominal discomfort, or change of bowel habits.  No melena or hematochezia.  She has good appetite and energy level, she functions very well at home.  She recently started a second medication for her diabetes, she states her sugar is controlled.  Pertinent positives and negatives of review of systems are listed and detailed within the above HPI.  REVIEW OF SYSTEMS:  Constitutional: Denies fevers, chills or abnormal weight loss Eyes: Denies blurriness of vision Ears, nose, mouth, throat, and face: Denies mucositis or sore throat Respiratory: Denies cough, dyspnea or wheezes Cardiovascular: Denies palpitation, chest discomfort or lower extremity swelling Gastrointestinal:  Denies nausea, heartburn or change in bowel habits Skin: Denies abnormal skin  rashes Lymphatics: Denies new lymphadenopathy or easy bruising Neurological:Denies numbness, tingling or new weaknesses Behavioral/Psych: Mood is stable, no new changes  All other systems were reviewed with the patient and are negative.  MEDICAL HISTORY:  Past Medical History:  Diagnosis Date  . Anemia   . Cancer ([place]) 2016   colon ca  . Falls    pt states fell twice this week   . History of blood transfusion    12/2014  . Hypertension     SURGICAL HISTORY: Past Surgical History:  Procedure Laterality Date  . colonscopy     . DILATION AND CURETTAGE OF UTERUS    . LAPAROSCOPIC PARTIAL COLECTOMY N/A 02/15/2015   Procedure: LAPAROSCOPIC RIGHT COLECTOMY AND SIGMOIDECTOMY PROCTOSCOPY;  Surgeon: Leighton Ruff, MD;  Location: [facility];  Service: General;  Laterality: N/A;  . UPPER GI ENDOSCOPY    . UPPER GI ENDOSCOPY  02/15/2015   Procedure: UPPER GI ENDOSCOPY;  Surgeon: Leighton Ruff, MD;  Location: [facility];  Service: General;;    I have reviewed the social history and family history with the patient and they are unchanged from previous note.  ALLERGIES:  has No Known Allergies.  MEDICATIONS:  Current Outpatient Medications  Medication Sig Dispense Refill  . acetaminophen (TYLENOL) 325 MG tablet Take 2 tablets (650 mg total) by mouth every 6 (six) hours as needed.    . metFORMIN (GLUCOPHAGE) 500 MG tablet Take 500 mg by mouth daily.     No current facility-administered medications for this visit.     PHYSICAL EXAMINATION: ECOG PERFORMANCE STATUS: 0 - Asymptomatic  Vitals:   04/15/18 0900  BP: 96/68  Pulse: 81  Resp: 17  Temp: 97.7 F (36.5 C)  SpO2: 100%   Filed Weights   04/15/18 0900  Weight: 145 lb 3.2 oz (65.9 kg)    GENERAL:alert, no distress and comfortable SKIN: skin color, texture, turgor are normal, no rashes or significant lesions EYES: normal, Conjunctiva are pink and non-injected, sclera clear OROPHARYNX:no exudate, no erythema and lips, buccal  mucosa, and tongue normal  NECK: supple, thyroid normal size, non-tender, without nodularity LYMPH:  no palpable lymphadenopathy in the cervical, axillary or inguinal LUNGS: clear to auscultation and percussion with normal breathing effort HEART: regular rate & rhythm and no murmurs and no lower extremity edema ABDOMEN:abdomen soft, non-tender and normal bowel sounds Musculoskeletal:no cyanosis of digits and no clubbing  NEURO: alert & oriented x 3 with fluent speech, no focal motor/sensory deficits  LABORATORY DATA:  I have reviewed the data as listed CBC Latest Ref Rng & Units 10/09/2017 04/17/2017 10/09/2016  WBC 3.9 - 10.3 K/uL 8.0 9.4 10.0  Hemoglobin 11.6 - 15.9 g/dL 13.3 13.4 13.8  Hematocrit 34.8 - 46.6 % 41.9 41.5 42.4  Platelets 145 - 400 K/uL 332 343 358     CMP Latest Ref Rng & Units 04/15/2018 10/09/2017 04/17/2017  Glucose 70 - 99 mg/dL 100(H) 84 162(H)  BUN 8 - 23 mg/dL _0 Creatinine 0.44 - 1.00 mg/dL 0.85 0.82 0.86  Sodium 135 - 145 mmol/L 142 142 140  Potassium 3.5 - 5.1 mmol/L 4.0 4.2 3.6  Chloride 98 - 111 mmol/L 104 106  103  CO2 22 - 32 mmol/L _0 Calcium 8.9 - 10.3 mg/dL 9.4 9.5 9.7  Total Protein 6.5 - 8.1 g/dL 7.6 7.3 7.6  Total Bilirubin 0.3 - 1.2 mg/dL 0.7 0.5 0.4  Alkaline Phos 38 - 126 U/L 113 56 77  AST 15 - 41 U/L 95(H) 17 22  ALT 0 - 44 U/L 112(H) 10 14      RADIOGRAPHIC STUDIES: I have personally reviewed the radiological images as listed and agreed with the findings in the report. No results found.   ASSESSMENT & PLAN:  LAURIN PAULO is a 78 y.o. female with history of  ASSESSMENT & PLAN:  78 y.o. African-American female with past medical history of hypertension, presented with fatigue, anemia and dizziness.  1. Right colon cancer, pT3N0M0 stage IIA, poorly differentiated,MSI-high, and sigmoid-rectal colon cancer, pT3N1cM0 stage IIIB, well differentiated,MSI-stable  -She was diagnosed 02/15/2015. She is s/p  Laparoscopic right  colectomy and sigmoidectomy. She completed adjuvant chemotherapy Xeloda 04/2015-09/2015 and tolerated well.   -She is over 3.5 years out from diagnosis, her risk of recurrence has significantly decreased.  Will continue colon cancer surveillance with lab, f/u and exam every 6 months. -She is clinically doing very well, asymptomatic, exam was unremarkable.  Lab reviewed, CBC and CMP are unremarkable, except mild elevated liver enzymes.  Will repeat lab in 1 months.  No clinical concern for recurrence. -Continue surveillance, I plan to see her back in 6 months with lab.  I do not plan to repeat surveillance CT scans.  2. Hypertension -She'll continue follow-up with her primary care physician -I previously encouraged her to drink more water -She monitors her blood pressure at home -She is not on antihypertension medication  3. DM    -Takes metformin, her PCP started her on a second medication, she does not remember the name. -I encouraged her to follow-up with her PCP, and to monitor her blood glucose at home.  4.  Transaminitis -New, total bilirubin normal.  She is asymptomatic, exam was normal. -This could be related to her recently started a new medication for diabetes, will contact her primary care physician to see if they will monitor her liver function, or I will repeat her CMP in a months.  4. Cancer screening -I encouraged the patient to resume annual screening mammograms   Plan  -Lab reviewed, mild transaminitis, will repeat lab in a month -Lab and f/u in 6 months  -will copy her PCP Dr. Alroy Dust    No problem-specific Assessment & Plan notes found for this encounter.   Orders Placed This Encounter  Procedures  . CBC with Differential (Cancer Center Only)    Standing Status:   Standing    Number of Occurrences:   50    Standing Expiration Date:   04/16/2023  . CMP ([facility] only)    Standing Status:   Standing    Number of Occurrences:   50    Standing Expiration  Date:   04/16/2023   All questions were answered. The patient knows to call the clinic with any problems, questions or concerns. No barriers to learning was detected. I spent 20 minutes counseling the patient face to face. The total time spent in the appointment was 25 minutes and more than 50% was on counseling and review of test results  I, Manson Allan am acting as scribe for Dr. Truitt Merle.  I have reviewed the above documentation for accuracy and completeness, and I agree with the  above.     Truitt Merle, MD 04/15/2018

## 2018-04-15 ENCOUNTER — Inpatient Hospital Stay (HOSPITAL_BASED_OUTPATIENT_CLINIC_OR_DEPARTMENT_OTHER): Payer: Medicare Other | Admitting: Hematology

## 2018-04-15 ENCOUNTER — Inpatient Hospital Stay: Payer: Medicare Other | Attending: Hematology

## 2018-04-15 ENCOUNTER — Telehealth: Payer: Self-pay

## 2018-04-15 ENCOUNTER — Encounter: Payer: Self-pay | Admitting: Hematology

## 2018-04-15 VITALS — BP 96/68 | HR 81 | Temp 97.7°F | Resp 17 | Ht 62.0 in | Wt 145.2 lb

## 2018-04-15 DIAGNOSIS — C772 Secondary and unspecified malignant neoplasm of intra-abdominal lymph nodes: Secondary | ICD-10-CM | POA: Diagnosis not present

## 2018-04-15 DIAGNOSIS — C19 Malignant neoplasm of rectosigmoid junction: Secondary | ICD-10-CM | POA: Diagnosis not present

## 2018-04-15 DIAGNOSIS — C182 Malignant neoplasm of ascending colon: Secondary | ICD-10-CM | POA: Diagnosis present

## 2018-04-15 DIAGNOSIS — Z7984 Long term (current) use of oral hypoglycemic drugs: Secondary | ICD-10-CM | POA: Insufficient documentation

## 2018-04-15 DIAGNOSIS — I10 Essential (primary) hypertension: Secondary | ICD-10-CM | POA: Insufficient documentation

## 2018-04-15 DIAGNOSIS — E119 Type 2 diabetes mellitus without complications: Secondary | ICD-10-CM | POA: Diagnosis not present

## 2018-04-15 DIAGNOSIS — R740 Nonspecific elevation of levels of transaminase and lactic acid dehydrogenase [LDH]: Secondary | ICD-10-CM | POA: Diagnosis not present

## 2018-04-15 LAB — CMP (CANCER CENTER ONLY)
ALT: 112 U/L — ABNORMAL HIGH (ref 0–44)
AST: 95 U/L — AB (ref 15–41)
Albumin: 3.9 g/dL (ref 3.5–5.0)
Alkaline Phosphatase: 113 U/L (ref 38–126)
Anion gap: 10 (ref 5–15)
BUN: 13 mg/dL (ref 8–23)
CHLORIDE: 104 mmol/L (ref 98–111)
CO2: 28 mmol/L (ref 22–32)
CREATININE: 0.85 mg/dL (ref 0.44–1.00)
Calcium: 9.4 mg/dL (ref 8.9–10.3)
GFR, Estimated: >60 mL/min (ref >60)
Glucose, Bld: 100 mg/dL — ABNORMAL HIGH (ref 70–99)
POTASSIUM: 4 mmol/L (ref 3.5–5.1)
SODIUM: 142 mmol/L (ref 135–145)
Total Bilirubin: 0.7 mg/dL (ref 0.3–1.2)
Total Protein: 7.6 g/dL (ref 6.5–8.1)

## 2018-04-15 LAB — CEA (IN HOUSE-CHCC): CEA (CHCC-In House): 1.19 ng/mL (ref 0.00–5.00)

## 2018-04-15 NOTE — Telephone Encounter (Signed)
Printed avs and calender of upcoming appointment. Per 2/13 los 

## 2018-04-20 ENCOUNTER — Telehealth: Payer: Self-pay

## 2018-04-20 NOTE — Telephone Encounter (Signed)
-----   Message from Truitt Merle, MD sent at 04/17/2018 11:53 PM EST ----- Please let pt know the lab results, her liver enzymes are elevated, not sure if it's related to her new DM meds. I have sent a message to her PCP. I recommend her to have her liver function checked again in a month, with PCP or [place], please set up if needed.  Thanks   Truitt Merle  04/17/2018

## 2018-04-20 NOTE — Telephone Encounter (Signed)
Patient calls back stating she will have repeat labs at her PCP office end of the month, sent scheduling a message to disregard.

## 2018-04-20 NOTE — Telephone Encounter (Signed)
Spoke with patient regarding lab results, per Dr. Burr Medico notified her that her liver enzymes are elevated, we are not sure if it is related to her new DM meds.  Dr. Burr Medico has notified your PCP, we would like a repeat lab in one month.  The patient would like this done here at the [facility], a scheduling message has been sent.  She will waiting to here from scheduling regarding the date and time.

## 2018-09-04 ENCOUNTER — Emergency Department (HOSPITAL_COMMUNITY)
Admission: EM | Admit: 2018-09-04 | Discharge: 2018-09-04 | Disposition: A | Discharge status: Home / Self Care | Payer: No Typology Code available for payment source | Attending: Emergency Medicine | Admitting: Emergency Medicine

## 2018-09-04 ENCOUNTER — Emergency Department (HOSPITAL_COMMUNITY): Payer: No Typology Code available for payment source

## 2018-09-04 ENCOUNTER — Encounter (HOSPITAL_COMMUNITY): Payer: Self-pay

## 2018-09-04 ENCOUNTER — Other Ambulatory Visit: Payer: Self-pay

## 2018-09-04 DIAGNOSIS — I10 Essential (primary) hypertension: Secondary | ICD-10-CM | POA: Insufficient documentation

## 2018-09-04 DIAGNOSIS — S6391XA Sprain of unspecified part of right wrist and hand, initial encounter: Secondary | ICD-10-CM | POA: Insufficient documentation

## 2018-09-04 DIAGNOSIS — Z87891 Personal history of nicotine dependence: Secondary | ICD-10-CM | POA: Diagnosis not present

## 2018-09-04 DIAGNOSIS — Y999 Unspecified external cause status: Secondary | ICD-10-CM | POA: Diagnosis not present

## 2018-09-04 DIAGNOSIS — S7001XA Contusion of right hip, initial encounter: Secondary | ICD-10-CM | POA: Diagnosis not present

## 2018-09-04 DIAGNOSIS — Y929 Unspecified place or not applicable: Secondary | ICD-10-CM | POA: Diagnosis not present

## 2018-09-04 DIAGNOSIS — Z85038 Personal history of other malignant neoplasm of large intestine: Secondary | ICD-10-CM | POA: Insufficient documentation

## 2018-09-04 DIAGNOSIS — Z7984 Long term (current) use of oral hypoglycemic drugs: Secondary | ICD-10-CM | POA: Insufficient documentation

## 2018-09-04 DIAGNOSIS — Y939 Activity, unspecified: Secondary | ICD-10-CM | POA: Insufficient documentation

## 2018-09-04 DIAGNOSIS — S79911A Unspecified injury of right hip, initial encounter: Secondary | ICD-10-CM | POA: Diagnosis present

## 2018-09-04 MED ORDER — TRAMADOL HCL 50 MG PO TABS: 50.0000 mg | ORAL_TABLET | Freq: Four times a day (QID) | ORAL | 0 refills | Status: DC | PRN
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
## 2018-09-04 NOTE — Discharge Instructions (Signed)
You may take Tylenol in addition to the Ultram as needed for pain.  Follow-up with your primary physician.
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
## 2018-10-14 ENCOUNTER — Encounter: Payer: Self-pay | Admitting: Hematology

## 2018-10-14 ENCOUNTER — Telehealth: Payer: Self-pay | Admitting: Hematology

## 2018-10-14 ENCOUNTER — Other Ambulatory Visit: Payer: Self-pay

## 2018-10-14 ENCOUNTER — Inpatient Hospital Stay: Payer: Medicare Other

## 2018-10-14 ENCOUNTER — Inpatient Hospital Stay: Payer: Medicare Other | Attending: Hematology | Admitting: Hematology

## 2018-10-14 VITALS — BP 106/89 | HR 70 | Temp 98.7°F | Resp 18 | Ht 62.0 in | Wt 145.8 lb

## 2018-10-14 DIAGNOSIS — Z803 Family history of malignant neoplasm of breast: Secondary | ICD-10-CM | POA: Diagnosis not present

## 2018-10-14 DIAGNOSIS — R740 Nonspecific elevation of levels of transaminase and lactic acid dehydrogenase [LDH]: Secondary | ICD-10-CM | POA: Diagnosis not present

## 2018-10-14 DIAGNOSIS — E119 Type 2 diabetes mellitus without complications: Secondary | ICD-10-CM | POA: Diagnosis not present

## 2018-10-14 DIAGNOSIS — C187 Malignant neoplasm of sigmoid colon: Secondary | ICD-10-CM

## 2018-10-14 DIAGNOSIS — Z79899 Other long term (current) drug therapy: Secondary | ICD-10-CM | POA: Insufficient documentation

## 2018-10-14 DIAGNOSIS — Z7984 Long term (current) use of oral hypoglycemic drugs: Secondary | ICD-10-CM | POA: Diagnosis not present

## 2018-10-14 DIAGNOSIS — C182 Malignant neoplasm of ascending colon: Secondary | ICD-10-CM

## 2018-10-14 DIAGNOSIS — I10 Essential (primary) hypertension: Secondary | ICD-10-CM | POA: Insufficient documentation

## 2018-10-14 DIAGNOSIS — Z85038 Personal history of other malignant neoplasm of large intestine: Secondary | ICD-10-CM | POA: Diagnosis not present

## 2018-10-14 DIAGNOSIS — Z9221 Personal history of antineoplastic chemotherapy: Secondary | ICD-10-CM | POA: Diagnosis not present

## 2018-10-14 DIAGNOSIS — C772 Secondary and unspecified malignant neoplasm of intra-abdominal lymph nodes: Secondary | ICD-10-CM

## 2018-10-14 DIAGNOSIS — Z9049 Acquired absence of other specified parts of digestive tract: Secondary | ICD-10-CM | POA: Diagnosis not present

## 2018-10-14 LAB — CMP (CANCER CENTER ONLY)
ALT: 22 U/L (ref 0–44)
AST: 24 U/L (ref 15–41)
Albumin: 3.9 g/dL (ref 3.5–5.0)
Alkaline Phosphatase: 63 U/L (ref 38–126)
Anion gap: 11 (ref 5–15)
BUN: 10 mg/dL (ref 8–23)
CO2: 26 mmol/L (ref 22–32)
Calcium: 9.4 mg/dL (ref 8.9–10.3)
Chloride: 104 mmol/L (ref 98–111)
Creatinine: 0.92 mg/dL (ref 0.44–1.00)
GFR, Est AFR Am: >60 mL/min (ref >60)
GFR, Estimated: 59 mL/min — ABNORMAL LOW (ref >60)
Glucose, Bld: 93 mg/dL (ref 70–99)
Potassium: 4.2 mmol/L (ref 3.5–5.1)
Sodium: 141 mmol/L (ref 135–145)
Total Bilirubin: 0.7 mg/dL (ref 0.3–1.2)
Total Protein: 7.5 g/dL (ref 6.5–8.1)

## 2018-10-14 LAB — CBC WITH DIFFERENTIAL (CANCER CENTER ONLY)
Abs Immature Granulocytes: 0.03 10*3/uL (ref 0.00–0.07)
Basophils Absolute: 0 10*3/uL (ref 0.0–0.1)
Basophils Relative: 0 %
Eosinophils Absolute: 0.2 10*3/uL (ref 0.0–0.5)
Eosinophils Relative: 2 %
HCT: 44 % (ref 36.0–46.0)
Hemoglobin: 13.8 g/dL (ref 12.0–15.0)
Immature Granulocytes: 0 %
Lymphocytes Relative: 23 %
Lymphs Abs: 2.2 10*3/uL (ref 0.7–4.0)
MCH: 26.5 pg (ref 26.0–34.0)
MCHC: 31.4 g/dL (ref 30.0–36.0)
MCV: 84.5 fL (ref 80.0–100.0)
Monocytes Absolute: 0.5 10*3/uL (ref 0.1–1.0)
Monocytes Relative: 5 %
Neutro Abs: 6.9 10*3/uL (ref 1.7–7.7)
Neutrophils Relative %: 70 %
Platelet Count: 304 10*3/uL (ref 150–400)
RBC: 5.21 MIL/uL — ABNORMAL HIGH (ref 3.87–5.11)
RDW: 14.6 % (ref 11.5–15.5)
WBC Count: 9.8 10*3/uL (ref 4.0–10.5)
nRBC: 0 % (ref 0.0–0.2)

## 2018-10-14 LAB — CEA (IN HOUSE-CHCC): CEA (CHCC-In House): 1.13 ng/mL (ref 0.00–5.00)

## 2018-10-14 NOTE — Telephone Encounter (Signed)
Scheduled per 08/13 los, patient received printed copy of avs and calender.

## 2018-10-15 ENCOUNTER — Telehealth: Payer: Self-pay

## 2018-10-15 NOTE — Telephone Encounter (Signed)
TC to pt per Dr Burr Medico to let her know that her lab CBC, CMP and CEA were all WNL, no concerns. Pt verbalized understanding. No further problems or concerns at this time.

## 2019-10-10 NOTE — Progress Notes (Signed)
[place]   [phone] [phone]   Clinic Follow up Note   Patient Care Team: Alroy Dust, L.Marlou Sa, MD as PCP - General (Family Medicine) Leighton Ruff, MD as Consulting Physician (General Surgery) Truitt Merle, MD as Consulting Physician (Hematology) Laurence Spates, MD (Inactive) as Consulting Physician (Gastroenterology)  Date of Service:  10/13/2019  CHIEF COMPLAINT: F/u stage III colon cancer  SUMMARY OF ONCOLOGIC HISTORY: Oncology History Overview Note  Cancer of right colon [facility])   Staging form: Colon and Rectum, AJCC 7th Edition     Pathologic stage from 02/15/2015: Stage IIA (T3, N0, cM0) - Signed by Truitt Merle, MD on 03/23/2015 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 Colonoscopy showed a ulcerated completely obstructing large mass in the rectosigmoid colon, 15 cm from anus. Diverticulosis in the rectosigmoid colon. Biopsy was taken from the mass.   12/29/2014 Imaging   CT chest, abdomen and pelvis showed a circumferential mass lesion in the proximal right colon above the ileocecal valve consistent with carcinoma of the colon. No adenopathy. No distant metastasis.   02/15/2015 Initial Diagnosis   Cancer of right colon ([place])   02/15/2015 Surgery   Laparoscopic right colectomy and sigmoidectomy   02/15/2015 Pathology Results   Right colon segmental resection showed a invasive poorly differentiated adenocarcinoma, spanning 8 cm, T3, margins were negative, 16 lymph nodes. No lymphovascular invasion, no perineural invasion. MMR showed loss of expression of ML H1 and PMS2, MSI-H   02/15/2015 Pathology Results   Sigmoid colon segmental resection showed invasive well differentiated  adenocarcinoma, 5.5 cm, T3, 10 lymph nodes were negative, (+) tumor deposit. margins were negative    04/04/2015 - 09/21/2015 Chemotherapy   Adjuvant Xeloda 1500 mg bid 14 days on-7 days off, s/p 8 cycles    10/26/2015 Imaging   Surveillance CT chest, abdomen and pelvis with contrast showed no evidence of recurrence, 13 mm right adrenal nodule, unchanged since 2016, favor benign adrenal adenoma.   10/09/2016 Imaging   CT CAP W Contrast 10/09/16 IMPRESSION: 1. No evidence of local tumor recurrence at the ileocolic or distal colonic anastomoses. 2. No evidence of metastatic disease in the chest, abdomen or pelvis. 3. Right adrenal nodule is stable back to 12/19/2014, most compatible with a benign adenoma. 4. Aortic Atherosclerosis (ICD10-I70.0). Two-vessel coronary atherosclerosis. 5. Small hiatal hernia.    10/12/2017 Imaging   10/12/2017 CT CAP IMPRESSION: 1. No evidence of tumor recurrence or metastatic disease within the chest, abdomen or pelvis. 2. No acute abnormalities. 3.  Aortic Atherosclerosis (ICD10-I70.0).      CURRENT THERAPY:  Surveillance  INTERVAL HISTORY:  Olivia Jackson is here for a follow up of colon cancer. She was last seen by me in 1 year ago. She presents to the clinic alone. She notes she is doing well. She denies any current pain. I reviewed her medication list with her. She sees her PCP this month and her eye doctor. She is barely on any medications.    REVIEW OF SYSTEMS:   Constitutional: Denies fevers, chills or abnormal weight loss Eyes: Denies blurriness of vision Ears, nose, mouth, throat, and face: Denies mucositis or sore throat Respiratory: Denies cough, dyspnea or wheezes Cardiovascular:  Denies palpitation, chest discomfort or lower extremity swelling Gastrointestinal:  Denies nausea, heartburn or change in bowel habits Skin: Denies abnormal skin rashes Lymphatics: Denies new lymphadenopathy or easy bruising Neurological:Denies numbness,  tingling or new weaknesses Behavioral/Psych: Mood is stable, no new changes  All other systems were reviewed with the patient and are negative.  MEDICAL HISTORY:  Past Medical History:  Diagnosis Date  . Anemia   . Cancer ([place]) 2016   colon ca  . Falls    pt states fell twice this week   . History of blood transfusion    12/2014  . Hypertension     SURGICAL HISTORY: Past Surgical History:  Procedure Laterality Date  . colonscopy     . DILATION AND CURETTAGE OF UTERUS    . LAPAROSCOPIC PARTIAL COLECTOMY N/A 02/15/2015   Procedure: LAPAROSCOPIC RIGHT COLECTOMY AND SIGMOIDECTOMY PROCTOSCOPY;  Surgeon: Leighton Ruff, MD;  Location: [facility];  Service: General;  Laterality: N/A;  . UPPER GI ENDOSCOPY    . UPPER GI ENDOSCOPY  02/15/2015   Procedure: UPPER GI ENDOSCOPY;  Surgeon: Leighton Ruff, MD;  Location: [facility];  Service: General;;    I have reviewed the social history and family history with the patient and they are unchanged from previous note.  ALLERGIES:  has No Known Allergies.  MEDICATIONS:  Current Outpatient Medications  Medication Sig Dispense Refill  . acetaminophen (TYLENOL) 325 MG tablet Take 2 tablets (650 mg total) by mouth every 6 (six) hours as needed.    . metFORMIN (GLUCOPHAGE) 500 MG tablet Take 500 mg by mouth daily.    . rosuvastatin (CRESTOR) 10 MG tablet TAKE 1 TABLET MON WED FRI FOR CHOLESTEROL ORALLY    . traMADol (ULTRAM) 50 MG tablet Take 1 tablet (50 mg total) by mouth every 6 (six) hours as needed for severe pain. 10 tablet 0   No current facility-administered medications for this visit.    PHYSICAL EXAMINATION: ECOG PERFORMANCE STATUS: 0 - Asymptomatic  Vitals:   10/13/19 1314  BP: 116/82  Pulse: 96  Resp: 18  Temp: (!) 97.3 F (36.3 C)  SpO2: 100%   Filed Weights   10/13/19 1314  Weight: 153 lb 12.8 oz (69.8 kg)    GENERAL:alert, no distress and comfortable SKIN: skin color, texture, turgor are normal, no rashes or significant  lesions EYES: normal, Conjunctiva are pink and non-injected, sclera clear  NECK: supple, thyroid normal size, non-tender, without nodularity LYMPH:  no palpable lymphadenopathy in the cervical, axillary  LUNGS: clear to auscultation and percussion with normal breathing effort HEART: regular rate & rhythm and no murmurs and no lower extremity edema ABDOMEN:abdomen soft, non-tender and normal bowel sounds (+) Midline surgical incision healed well with mild scar tissue.  Musculoskeletal:no cyanosis of digits and no clubbing  NEURO: alert & oriented x 3 with fluent speech, no focal motor/sensory deficits  LABORATORY DATA:  I have reviewed the data as listed CBC Latest Ref Rng & Units 10/13/2019 10/14/2018 10/09/2017  WBC 4.0 - 10.5 K/uL 10.3 9.8 8.0  Hemoglobin 12.0 - 15.0 g/dL 14.2 13.8 13.3  Hematocrit 36 - 46 % 45.8 44.0 41.9  Platelets 150 - 400 K/uL 303 304 332     CMP Latest Ref Rng & Units 10/13/2019 10/14/2018 04/15/2018  Glucose 70 - 99 mg/dL 122(H) 93 100(H)  BUN 8 - 23 mg/dL _0 Creatinine 0.44 - 1.00 mg/dL 0.95 0.92 0.85  Sodium 135 - 145 mmol/L 142 141 142  Potassium 3.5 -  5.1 mmol/L 4.1 4.2 4.0  Chloride 98 - 111 mmol/L 107 104 104  CO2 22 - 32 mmol/L _0 Calcium 8.9 - 10.3 mg/dL 10.1 9.4 9.4  Total Protein 6.5 - 8.1 g/dL 7.9 7.5 7.6  Total Bilirubin 0.3 - 1.2 mg/dL 0.6 0.7 0.7  Alkaline Phos 38 - 126 U/L 60 63 113  AST 15 - 41 U/L 18 24 95(H)  ALT 0 - 44 U/L 11 22 112(H)      RADIOGRAPHIC STUDIES: I have personally reviewed the radiological images as listed and agreed with the findings in the report. No results found.   ASSESSMENT & PLAN:  Olivia Jackson is a 79 y.o. female with    1. Right colon cancer, pT3N0M0 stage IIA, poorly differentiated, MSI-high, and sigmoid-rectal colon cancer, pT3N1cM0 stage IIIB, well differentiated,MSI-stable  -She was diagnosed 02/15/2015. She is s/pLaparoscopic right colectomy and sigmoidectomy.She completed adjuvant  chemotherapy Xeloda2/2017-09/2015 and tolerated well.Currently on surveillance.  -She is clinically doing well. Lab reviewed, her CBC and CMP are within normal limits except BG 122. Her physical exam unremarkable. There is no clinical concern for recurrence. -Continue surveillance. Her last colonoscopy was in 2017. I discussed she is due for repeat this year with Dr Oletta Lamas. Pt stated she probably had one last year  -She is almost 5 years since her cancer diagnosis. Her risk of recurrence is minimal now. She can continue surveillance with her PCP.  -F/u with me as needed in the future.   2. Hypertension, DM -Continue medication and monitoring at home  -Continue to f/u with her PCP  3.Cancerscreening -Her sister had breast cancer. The patient had benign breast lesion in the past. I have previously encouraged her to have yearly mammograms and the benefits. She declined and will continue to do self breast exams and having her breast checked.    Plan -She is clinically doing well -Proceed with Colonoscopy in 2021 if she has not had since 2017. Will copy note to Dr Oletta Lamas  -F/u with me as needed in the future.    No problem-specific Assessment & Plan notes found for this encounter.   No orders of the defined types were placed in this encounter.  All questions were answered. The patient knows to call the clinic with any problems, questions or concerns. No barriers to learning was detected.      Truitt Merle, MD 10/13/2019   I, Joslyn Devon, am acting as scribe for Truitt Merle, MD.   I have reviewed the above documentation for accuracy and completeness, and I agree with the above.

## 2019-10-13 ENCOUNTER — Inpatient Hospital Stay: Payer: Medicare Other | Attending: Hematology

## 2019-10-13 ENCOUNTER — Inpatient Hospital Stay (HOSPITAL_BASED_OUTPATIENT_CLINIC_OR_DEPARTMENT_OTHER): Payer: Medicare Other | Admitting: Hematology

## 2019-10-13 ENCOUNTER — Other Ambulatory Visit: Payer: Self-pay

## 2019-10-13 VITALS — BP 116/82 | HR 96 | Temp 97.3°F | Resp 18 | Ht 62.0 in | Wt 153.8 lb

## 2019-10-13 DIAGNOSIS — Z85038 Personal history of other malignant neoplasm of large intestine: Secondary | ICD-10-CM | POA: Diagnosis not present

## 2019-10-13 DIAGNOSIS — C182 Malignant neoplasm of ascending colon: Secondary | ICD-10-CM

## 2019-10-13 DIAGNOSIS — Z7984 Long term (current) use of oral hypoglycemic drugs: Secondary | ICD-10-CM | POA: Insufficient documentation

## 2019-10-13 DIAGNOSIS — Z79899 Other long term (current) drug therapy: Secondary | ICD-10-CM | POA: Insufficient documentation

## 2019-10-13 DIAGNOSIS — Z803 Family history of malignant neoplasm of breast: Secondary | ICD-10-CM | POA: Insufficient documentation

## 2019-10-13 DIAGNOSIS — I10 Essential (primary) hypertension: Secondary | ICD-10-CM | POA: Insufficient documentation

## 2019-10-13 DIAGNOSIS — E119 Type 2 diabetes mellitus without complications: Secondary | ICD-10-CM | POA: Insufficient documentation

## 2019-10-13 LAB — CBC WITH DIFFERENTIAL (CANCER CENTER ONLY)
Abs Immature Granulocytes: 0.03 10*3/uL (ref 0.00–0.07)
Basophils Absolute: 0 10*3/uL (ref 0.0–0.1)
Basophils Relative: 0 %
Eosinophils Absolute: 0.2 10*3/uL (ref 0.0–0.5)
Eosinophils Relative: 2 %
HCT: 45.8 % (ref 36.0–46.0)
Hemoglobin: 14.2 g/dL (ref 12.0–15.0)
Immature Granulocytes: 0 %
Lymphocytes Relative: 24 %
Lymphs Abs: 2.4 10*3/uL (ref 0.7–4.0)
MCH: 26 pg (ref 26.0–34.0)
MCHC: 31 g/dL (ref 30.0–36.0)
MCV: 83.9 fL (ref 80.0–100.0)
Monocytes Absolute: 0.6 10*3/uL (ref 0.1–1.0)
Monocytes Relative: 5 %
Neutro Abs: 7 10*3/uL (ref 1.7–7.7)
Neutrophils Relative %: 69 %
Platelet Count: 303 10*3/uL (ref 150–400)
RBC: 5.46 MIL/uL — ABNORMAL HIGH (ref 3.87–5.11)
RDW: 14.9 % (ref 11.5–15.5)
WBC Count: 10.3 10*3/uL (ref 4.0–10.5)
nRBC: 0 % (ref 0.0–0.2)

## 2019-10-13 LAB — CMP (CANCER CENTER ONLY)
ALT: 11 U/L (ref 0–44)
AST: 18 U/L (ref 15–41)
Albumin: 3.9 g/dL (ref 3.5–5.0)
Alkaline Phosphatase: 60 U/L (ref 38–126)
Anion gap: 12 (ref 5–15)
BUN: 9 mg/dL (ref 8–23)
CO2: 23 mmol/L (ref 22–32)
Calcium: 10.1 mg/dL (ref 8.9–10.3)
Chloride: 107 mmol/L (ref 98–111)
Creatinine: 0.95 mg/dL (ref 0.44–1.00)
GFR, Est AFR Am: >60 mL/min (ref >60)
GFR, Estimated: 57 mL/min — ABNORMAL LOW (ref >60)
Glucose, Bld: 122 mg/dL — ABNORMAL HIGH (ref 70–99)
Potassium: 4.1 mmol/L (ref 3.5–5.1)
Sodium: 142 mmol/L (ref 135–145)
Total Bilirubin: 0.6 mg/dL (ref 0.3–1.2)
Total Protein: 7.9 g/dL (ref 6.5–8.1)

## 2019-10-13 LAB — CEA (IN HOUSE-CHCC): CEA (CHCC-In House): <1 ng/mL (ref 0.00–5.00)

## 2019-10-15 ENCOUNTER — Encounter: Payer: Self-pay | Admitting: Hematology

## 2020-05-02 DIAGNOSIS — E7800 Pure hypercholesterolemia, unspecified: Secondary | ICD-10-CM | POA: Diagnosis not present

## 2020-05-02 DIAGNOSIS — E119 Type 2 diabetes mellitus without complications: Secondary | ICD-10-CM | POA: Diagnosis not present

## 2020-05-02 DIAGNOSIS — Z0000 Encounter for general adult medical examination without abnormal findings: Secondary | ICD-10-CM | POA: Diagnosis not present

## 2020-09-05 DIAGNOSIS — H40013 Open angle with borderline findings, low risk, bilateral: Secondary | ICD-10-CM | POA: Diagnosis not present

## 2020-09-05 DIAGNOSIS — H524 Presbyopia: Secondary | ICD-10-CM | POA: Diagnosis not present

## 2020-09-05 DIAGNOSIS — E119 Type 2 diabetes mellitus without complications: Secondary | ICD-10-CM | POA: Diagnosis not present

## 2020-09-05 DIAGNOSIS — Z961 Presence of intraocular lens: Secondary | ICD-10-CM | POA: Diagnosis not present

## 2020-11-02 DIAGNOSIS — E119 Type 2 diabetes mellitus without complications: Secondary | ICD-10-CM | POA: Diagnosis not present

## 2020-11-02 DIAGNOSIS — I890 Lymphedema, not elsewhere classified: Secondary | ICD-10-CM | POA: Diagnosis not present

## 2020-11-02 DIAGNOSIS — E7800 Pure hypercholesterolemia, unspecified: Secondary | ICD-10-CM | POA: Diagnosis not present

## 2020-11-02 DIAGNOSIS — Z23 Encounter for immunization: Secondary | ICD-10-CM | POA: Diagnosis not present

## 2020-11-02 DIAGNOSIS — I700 Atherosclerosis of aorta: Secondary | ICD-10-CM | POA: Diagnosis not present

## 2021-05-07 DIAGNOSIS — Z85038 Personal history of other malignant neoplasm of large intestine: Secondary | ICD-10-CM | POA: Diagnosis not present

## 2021-05-07 DIAGNOSIS — E119 Type 2 diabetes mellitus without complications: Secondary | ICD-10-CM | POA: Diagnosis not present

## 2021-05-07 DIAGNOSIS — E1169 Type 2 diabetes mellitus with other specified complication: Secondary | ICD-10-CM | POA: Diagnosis not present

## 2021-05-07 DIAGNOSIS — I700 Atherosclerosis of aorta: Secondary | ICD-10-CM | POA: Diagnosis not present

## 2021-05-07 DIAGNOSIS — E7800 Pure hypercholesterolemia, unspecified: Secondary | ICD-10-CM | POA: Diagnosis not present

## 2021-05-07 DIAGNOSIS — Z0000 Encounter for general adult medical examination without abnormal findings: Secondary | ICD-10-CM | POA: Diagnosis not present

## 2021-05-17 DIAGNOSIS — Z1231 Encounter for screening mammogram for malignant neoplasm of breast: Secondary | ICD-10-CM | POA: Diagnosis not present

## 2021-05-17 DIAGNOSIS — Z780 Asymptomatic menopausal state: Secondary | ICD-10-CM | POA: Diagnosis not present

## 2021-05-21 DIAGNOSIS — R921 Mammographic calcification found on diagnostic imaging of breast: Secondary | ICD-10-CM | POA: Diagnosis not present

## 2021-05-21 DIAGNOSIS — R928 Other abnormal and inconclusive findings on diagnostic imaging of breast: Secondary | ICD-10-CM | POA: Diagnosis not present

## 2021-05-21 DIAGNOSIS — R922 Inconclusive mammogram: Secondary | ICD-10-CM | POA: Diagnosis not present

## 2021-05-28 ENCOUNTER — Other Ambulatory Visit: Payer: Self-pay | Admitting: Radiology

## 2021-05-28 DIAGNOSIS — N6311 Unspecified lump in the right breast, upper outer quadrant: Secondary | ICD-10-CM | POA: Diagnosis not present

## 2021-05-28 DIAGNOSIS — N6031 Fibrosclerosis of right breast: Secondary | ICD-10-CM | POA: Diagnosis not present

## 2021-06-19 ENCOUNTER — Other Ambulatory Visit: Payer: Self-pay | Admitting: Radiology

## 2021-06-19 DIAGNOSIS — R920 Mammographic microcalcification found on diagnostic imaging of breast: Secondary | ICD-10-CM | POA: Diagnosis not present

## 2021-06-19 DIAGNOSIS — R928 Other abnormal and inconclusive findings on diagnostic imaging of breast: Secondary | ICD-10-CM | POA: Diagnosis not present

## 2021-06-19 DIAGNOSIS — N641 Fat necrosis of breast: Secondary | ICD-10-CM | POA: Diagnosis not present

## 2021-06-19 DIAGNOSIS — N6031 Fibrosclerosis of right breast: Secondary | ICD-10-CM | POA: Diagnosis not present

## 2021-10-08 DIAGNOSIS — H52203 Unspecified astigmatism, bilateral: Secondary | ICD-10-CM | POA: Diagnosis not present

## 2021-10-08 DIAGNOSIS — H40013 Open angle with borderline findings, low risk, bilateral: Secondary | ICD-10-CM | POA: Diagnosis not present

## 2021-10-08 DIAGNOSIS — H26491 Other secondary cataract, right eye: Secondary | ICD-10-CM | POA: Diagnosis not present

## 2021-10-08 DIAGNOSIS — E119 Type 2 diabetes mellitus without complications: Secondary | ICD-10-CM | POA: Diagnosis not present

## 2021-11-14 DIAGNOSIS — I890 Lymphedema, not elsewhere classified: Secondary | ICD-10-CM | POA: Diagnosis not present

## 2021-11-14 DIAGNOSIS — I700 Atherosclerosis of aorta: Secondary | ICD-10-CM | POA: Diagnosis not present

## 2021-11-14 DIAGNOSIS — E1169 Type 2 diabetes mellitus with other specified complication: Secondary | ICD-10-CM | POA: Diagnosis not present

## 2021-11-14 DIAGNOSIS — Z23 Encounter for immunization: Secondary | ICD-10-CM | POA: Diagnosis not present

## 2021-11-14 DIAGNOSIS — E7800 Pure hypercholesterolemia, unspecified: Secondary | ICD-10-CM | POA: Diagnosis not present

## 2021-12-24 DIAGNOSIS — N6311 Unspecified lump in the right breast, upper outer quadrant: Secondary | ICD-10-CM | POA: Diagnosis not present

## 2021-12-24 DIAGNOSIS — R928 Other abnormal and inconclusive findings on diagnostic imaging of breast: Secondary | ICD-10-CM | POA: Diagnosis not present

## 2021-12-24 DIAGNOSIS — R922 Inconclusive mammogram: Secondary | ICD-10-CM | POA: Diagnosis not present

## 2022-01-06 DIAGNOSIS — Z85038 Personal history of other malignant neoplasm of large intestine: Secondary | ICD-10-CM | POA: Diagnosis not present

## 2022-01-06 DIAGNOSIS — Z08 Encounter for follow-up examination after completed treatment for malignant neoplasm: Secondary | ICD-10-CM | POA: Diagnosis not present

## 2022-01-13 DIAGNOSIS — D124 Benign neoplasm of descending colon: Secondary | ICD-10-CM | POA: Diagnosis not present

## 2022-01-13 DIAGNOSIS — K635 Polyp of colon: Secondary | ICD-10-CM | POA: Diagnosis not present

## 2022-01-13 DIAGNOSIS — Z09 Encounter for follow-up examination after completed treatment for conditions other than malignant neoplasm: Secondary | ICD-10-CM | POA: Diagnosis not present

## 2022-01-13 DIAGNOSIS — K648 Other hemorrhoids: Secondary | ICD-10-CM | POA: Diagnosis not present

## 2022-01-13 DIAGNOSIS — Z980 Intestinal bypass and anastomosis status: Secondary | ICD-10-CM | POA: Diagnosis not present

## 2022-01-13 DIAGNOSIS — D125 Benign neoplasm of sigmoid colon: Secondary | ICD-10-CM | POA: Diagnosis not present

## 2022-01-13 DIAGNOSIS — Z85038 Personal history of other malignant neoplasm of large intestine: Secondary | ICD-10-CM | POA: Diagnosis not present

## 2022-01-13 DIAGNOSIS — K5730 Diverticulosis of large intestine without perforation or abscess without bleeding: Secondary | ICD-10-CM | POA: Diagnosis not present

## 2022-01-15 DIAGNOSIS — D125 Benign neoplasm of sigmoid colon: Secondary | ICD-10-CM | POA: Diagnosis not present

## 2022-01-15 DIAGNOSIS — K635 Polyp of colon: Secondary | ICD-10-CM | POA: Diagnosis not present

## 2022-01-15 DIAGNOSIS — D124 Benign neoplasm of descending colon: Secondary | ICD-10-CM | POA: Diagnosis not present

## 2022-05-19 DIAGNOSIS — R634 Abnormal weight loss: Secondary | ICD-10-CM | POA: Diagnosis not present

## 2022-05-19 DIAGNOSIS — I700 Atherosclerosis of aorta: Secondary | ICD-10-CM | POA: Diagnosis not present

## 2022-05-19 DIAGNOSIS — I10 Essential (primary) hypertension: Secondary | ICD-10-CM | POA: Diagnosis not present

## 2022-05-19 DIAGNOSIS — I890 Lymphedema, not elsewhere classified: Secondary | ICD-10-CM | POA: Diagnosis not present

## 2022-05-19 DIAGNOSIS — E1169 Type 2 diabetes mellitus with other specified complication: Secondary | ICD-10-CM | POA: Diagnosis not present

## 2022-05-19 DIAGNOSIS — E119 Type 2 diabetes mellitus without complications: Secondary | ICD-10-CM | POA: Diagnosis not present

## 2022-05-19 DIAGNOSIS — Z0000 Encounter for general adult medical examination without abnormal findings: Secondary | ICD-10-CM | POA: Diagnosis not present

## 2022-05-19 DIAGNOSIS — Z1239 Encounter for other screening for malignant neoplasm of breast: Secondary | ICD-10-CM | POA: Diagnosis not present

## 2022-05-19 DIAGNOSIS — Z85038 Personal history of other malignant neoplasm of large intestine: Secondary | ICD-10-CM | POA: Diagnosis not present

## 2022-05-19 DIAGNOSIS — D509 Iron deficiency anemia, unspecified: Secondary | ICD-10-CM | POA: Diagnosis not present

## 2022-05-19 DIAGNOSIS — E7800 Pure hypercholesterolemia, unspecified: Secondary | ICD-10-CM | POA: Diagnosis not present

## 2022-06-24 DIAGNOSIS — E7800 Pure hypercholesterolemia, unspecified: Secondary | ICD-10-CM | POA: Diagnosis not present

## 2022-06-27 DIAGNOSIS — R922 Inconclusive mammogram: Secondary | ICD-10-CM | POA: Diagnosis not present

## 2022-06-27 DIAGNOSIS — N6311 Unspecified lump in the right breast, upper outer quadrant: Secondary | ICD-10-CM | POA: Diagnosis not present

## 2022-08-18 DIAGNOSIS — H04322 Acute dacryocystitis of left lacrimal passage: Secondary | ICD-10-CM | POA: Diagnosis not present

## 2022-08-21 DIAGNOSIS — H04302 Unspecified dacryocystitis of left lacrimal passage: Secondary | ICD-10-CM | POA: Diagnosis not present

## 2022-09-02 DIAGNOSIS — H04322 Acute dacryocystitis of left lacrimal passage: Secondary | ICD-10-CM | POA: Diagnosis not present

## 2022-09-23 DIAGNOSIS — H04553 Acquired stenosis of bilateral nasolacrimal duct: Secondary | ICD-10-CM | POA: Diagnosis not present

## 2022-10-21 DIAGNOSIS — H40013 Open angle with borderline findings, low risk, bilateral: Secondary | ICD-10-CM | POA: Diagnosis not present

## 2022-10-21 DIAGNOSIS — E119 Type 2 diabetes mellitus without complications: Secondary | ICD-10-CM | POA: Diagnosis not present

## 2022-10-21 DIAGNOSIS — H26491 Other secondary cataract, right eye: Secondary | ICD-10-CM | POA: Diagnosis not present

## 2022-10-21 DIAGNOSIS — H524 Presbyopia: Secondary | ICD-10-CM | POA: Diagnosis not present

## 2022-10-21 DIAGNOSIS — H5203 Hypermetropia, bilateral: Secondary | ICD-10-CM | POA: Diagnosis not present

## 2022-10-21 DIAGNOSIS — H43813 Vitreous degeneration, bilateral: Secondary | ICD-10-CM | POA: Diagnosis not present

## 2022-11-19 DIAGNOSIS — E119 Type 2 diabetes mellitus without complications: Secondary | ICD-10-CM | POA: Diagnosis not present

## 2022-11-19 DIAGNOSIS — E1169 Type 2 diabetes mellitus with other specified complication: Secondary | ICD-10-CM | POA: Diagnosis not present

## 2022-11-19 DIAGNOSIS — Z85038 Personal history of other malignant neoplasm of large intestine: Secondary | ICD-10-CM | POA: Diagnosis not present

## 2022-11-19 DIAGNOSIS — I10 Essential (primary) hypertension: Secondary | ICD-10-CM | POA: Diagnosis not present

## 2022-11-19 DIAGNOSIS — E7800 Pure hypercholesterolemia, unspecified: Secondary | ICD-10-CM | POA: Diagnosis not present

## 2022-11-19 DIAGNOSIS — D509 Iron deficiency anemia, unspecified: Secondary | ICD-10-CM | POA: Diagnosis not present

## 2022-11-19 DIAGNOSIS — I499 Cardiac arrhythmia, unspecified: Secondary | ICD-10-CM | POA: Diagnosis not present

## 2022-11-24 ENCOUNTER — Other Ambulatory Visit: Payer: Self-pay

## 2022-11-24 ENCOUNTER — Ambulatory Visit (INDEPENDENT_AMBULATORY_CARE_PROVIDER_SITE_OTHER): Payer: Medicare Other

## 2022-11-24 ENCOUNTER — Telehealth: Payer: Self-pay | Admitting: Podiatry

## 2022-11-24 ENCOUNTER — Ambulatory Visit: Payer: Medicare Other | Admitting: Podiatry

## 2022-11-24 DIAGNOSIS — L0390 Cellulitis, unspecified: Secondary | ICD-10-CM | POA: Diagnosis not present

## 2022-11-24 DIAGNOSIS — R600 Localized edema: Secondary | ICD-10-CM | POA: Diagnosis not present

## 2022-11-24 DIAGNOSIS — L97524 Non-pressure chronic ulcer of other part of left foot with necrosis of bone: Secondary | ICD-10-CM

## 2022-11-24 DIAGNOSIS — B351 Tinea unguium: Secondary | ICD-10-CM | POA: Diagnosis not present

## 2022-11-24 DIAGNOSIS — L97529 Non-pressure chronic ulcer of other part of left foot with unspecified severity: Secondary | ICD-10-CM | POA: Diagnosis not present

## 2022-11-24 DIAGNOSIS — M869 Osteomyelitis, unspecified: Secondary | ICD-10-CM

## 2022-11-24 DIAGNOSIS — E11621 Type 2 diabetes mellitus with foot ulcer: Secondary | ICD-10-CM | POA: Diagnosis not present

## 2022-11-24 MED ORDER — SILVER SULFADIAZINE 1 % EX CREA: 1.0000 | TOPICAL_CREAM | Freq: Every day | CUTANEOUS | 0 refills | Status: DC

## 2022-11-24 MED ORDER — DOXYCYCLINE HYCLATE 100 MG PO CAPS: 100.0000 mg | ORAL_CAPSULE | Freq: Two times a day (BID) | ORAL | 0 refills | Status: DC

## 2022-11-24 NOTE — Telephone Encounter (Signed)
Called patient to review x-rays after her visit.  Left voicemail to call the office so that we can review and discuss next steps.  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  There is also absence of middle and distal phalanx of left 5th toe seen incidentally.  Need to set up MRI and arterial ultrasound bilateral to evaluate healing potential.  Orders were placed, but still need to discuss with patient.

## 2022-11-24 NOTE — Progress Notes (Signed)
Chief Complaint  Patient presents with   Toe Pain    Left great toe wound    HPI: 82 y.o. female presents today upon urgent referral from PCP, with c/o possible bone infection to left great toe. Patient notes there has been a loose, lifting left hallux nail for approximately one month.  Does not recall trauma.  Her sister is with her today.  She has not been taking any antibiotics to date.  Denies pain, but states she has neuropathy in her feet.  Denies fever, chills, night sweats, nausea, or vomiting.    Past Medical History:  Diagnosis Date   Anemia    Cancer (HCC) 2016   colon ca   Falls    pt states fell twice this week    History of blood transfusion    12/2014   Hypertension    Past Surgical History:  Procedure Laterality Date   colonscopy      DILATION AND CURETTAGE OF UTERUS     LAPAROSCOPIC PARTIAL COLECTOMY N/A 02/15/2015   Procedure: LAPAROSCOPIC RIGHT COLECTOMY AND SIGMOIDECTOMY PROCTOSCOPY;  Surgeon: Romie Levee, MD;  Location: [facility];  Service: General;  Laterality: N/A;   UPPER GI ENDOSCOPY     UPPER GI ENDOSCOPY  02/15/2015   Procedure: UPPER GI ENDOSCOPY;  Surgeon: Romie Levee, MD;  Location: [facility];  Service: General;;   No Known Allergies   Physical Exam: General: The patient is alert and oriented x3 in no acute distress.  Dermatology: Skin is warm and supple to left foot until level of hallux near MPJ.  Skin then becomes leathery, hyperpigmented, with large hyperkeratotic lesion plantarmedial IPJ.  The hallux nail is mycotic, thick, and lytic to eponychium with significant maceration and musty odor under toenail.  No purulence or gangrene noted.  Granular base revealed upon removing macerated tissue from area.  Upon shaving of the callus on the hallux, there is a full-thickness ulceration at the plantarmedial aspect of the hallux IPJ.  Upon removal of hallux nail, the distal phalanx is exposed once area is cleansed/rinsed with sterile  saline.  Vascular: Non-palpable pedal pulses bilaterally. Edematous left hallux.    Neurological: Light touch sensation absent to toes left foot  Musculoskeletal Exam: Decreased 1st MPJ ROM left foot.    Radiographic Exam (left foot, 3 wb views, 11/24/22):  Osteolysis of over 50% of left hallux distal phalanx.  Absence of left 5th toe middle and distal phalanx.  No gas in soft tissues.  No foreign body seen.    Assessment/Plan of Care: 1. Wound cellulitis   2. Left hallux osteomyelitis (HCC)   3. 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 Localized edema     Meds ordered this encounter  Medications   silver sulfADIAZINE (SILVADENE) 1 % cream    Sig: Apply 1 Application topically daily. Apply once daily to wound and cover with gauze dressing    Dispense:  50 g    Refill:  0   doxycycline (VIBRAMYCIN) 100 MG capsule    Sig: Take 1 capsule (100 mg total) by mouth 2 (two) times daily for 10 days.    Dispense:  20 capsule    Refill:  0   DG FOOT COMPLETE LEFT MR FOOT LEFT WO CONTRAST VAS [place] LOWER EXTREMITY ARTERIAL DUPLEX  Discussed clinical findings with patient today.  With patient consent, the left hallux was anesthetized proximal to the MPJ with a 50:50 mixture of 1% lidocaine plain  and 0.5% sensorcaine plain.  Following sterile skin prep and confirmation of anesthesia, the left hallux nail was detached from the eponychium soft tissue, it's only point of attachment.  Nail easily removed.  Distal phalanx visualized with macerated nail bed.  Upon cleaning of area with sterile saline, granular base revealed.  Small amount of bleeding noted and hemostasis obtained.  Silvadene cream and DSD applied.  Keep on until tomorrow, then start daily dressing changes.    Rx doxycycline 100mg  BID x 10 days and silvadene cream to wound every day with gauze dressing changes once per day.    Patient will need MRI soon to confirm and evaluate level of osteomyelitis involvement to the  hallux.  Also need to obtain arterial ABI's and arterial duplex to evaluate healing potential for possible surgery and/or long-term antibiotic intervention.  Patient is high risk for toe / foot amputation due to current suspected bone infection, diabetes with peripheral neuropathy,   If proceeding with surgery, then will also need to obtain bloodwork, including CBC with diff, ESR, CRP, HbA1c and CMP, as well as bone swabs if possible.    F/u one week.   Clerance Lav, DPM, FACFAS [facility]     [address] [place], [place] [address]                Office [phone]  Fax [phone]

## 2022-11-26 ENCOUNTER — Other Ambulatory Visit: Payer: Self-pay | Admitting: Podiatry

## 2022-11-26 DIAGNOSIS — L97524 Non-pressure chronic ulcer of other part of left foot with necrosis of bone: Secondary | ICD-10-CM

## 2022-11-26 DIAGNOSIS — R600 Localized edema: Secondary | ICD-10-CM

## 2022-11-26 DIAGNOSIS — B351 Tinea unguium: Secondary | ICD-10-CM

## 2022-11-26 DIAGNOSIS — M869 Osteomyelitis, unspecified: Secondary | ICD-10-CM

## 2022-11-26 DIAGNOSIS — L0390 Cellulitis, unspecified: Secondary | ICD-10-CM

## 2022-12-01 ENCOUNTER — Encounter: Payer: Self-pay | Admitting: Podiatry

## 2022-12-01 ENCOUNTER — Ambulatory Visit: Payer: Medicare Other | Admitting: Podiatry

## 2022-12-01 DIAGNOSIS — L080 Pyoderma: Secondary | ICD-10-CM | POA: Diagnosis not present

## 2022-12-01 DIAGNOSIS — L97524 Non-pressure chronic ulcer of other part of left foot with necrosis of bone: Secondary | ICD-10-CM

## 2022-12-01 DIAGNOSIS — I70245 Atherosclerosis of native arteries of left leg with ulceration of other part of foot: Secondary | ICD-10-CM | POA: Diagnosis not present

## 2022-12-01 DIAGNOSIS — L0390 Cellulitis, unspecified: Secondary | ICD-10-CM

## 2022-12-01 DIAGNOSIS — M869 Osteomyelitis, unspecified: Secondary | ICD-10-CM

## 2022-12-01 MED ORDER — DOXYCYCLINE HYCLATE 100 MG PO CAPS: 100.0000 mg | ORAL_CAPSULE | Freq: Two times a day (BID) | ORAL | 0 refills | Status: AC

## 2022-12-01 NOTE — Progress Notes (Signed)
Chief Complaint  Patient presents with   Foot Ulcer    Follow up ulceration hallux left   "Its certainly not as painful,  but I guess she will have to determine if it actually looks better"  Still has a couple doses of Doxy left to take   HPI: 82 y.o. female presenting today with her sister for follow-up of the left hallux ulcer.  The x-rays taken on last visit showed osteolysis of the distal phalanx of the hallux.  An MRI had been ordered but the patient states that it has not been performed yet.  As she states that someone did call her from the imaging center but she did not schedule yet.  As she has been changing the dressing daily and wrapping with gauze.  She is wearing closed toe shoes today.  States her pain has improved.  She is taking the doxycycline without any side effects at this time.  Past Medical History:  Diagnosis Date   Anemia    Cancer (HCC) 2016   colon ca   Falls    pt states fell twice this week    History of blood transfusion    12/2014   Hypertension     Past Surgical History:  Procedure Laterality Date   colonscopy      DILATION AND CURETTAGE OF UTERUS     LAPAROSCOPIC PARTIAL COLECTOMY N/A 02/15/2015   Procedure: LAPAROSCOPIC RIGHT COLECTOMY AND SIGMOIDECTOMY PROCTOSCOPY;  Surgeon: Romie Levee, MD;  Location: [facility];  Service: General;  Laterality: N/A;   UPPER GI ENDOSCOPY     UPPER GI ENDOSCOPY  02/15/2015   Procedure: UPPER GI ENDOSCOPY;  Surgeon: Romie Levee, MD;  Location: [facility];  Service: General;;   No Known Allergies   PHYSICAL EXAM: There were no vitals filed for this visit.  General: The patient is alert and oriented x3 in no acute distress.  Dermatology:   The hyperpigmentation isolated to the left hallux is slightly faded since last seen.  The skin remains leathery around the hallux.    Wound 1 description:  Location: Dorsal left hallux at distal portion of toe    Depth: To capsule    Wound Border: Necrosis at the distal  border of the wound and improving skin at the remainder of the wound edges    Wound Base: Fibrotic Odor?:  No    Surrounding Tissue: Decreased edema and decreased hyperpigmentation of toe    Infected?:  Yes    Necrosis?:  Yes to distal skin    Pain?:  Less pain than last visit    Tunneling: No    Dimensions (cm): 0.9 x 0.9 x 0.4 cm  Vascular: Pedal pulses are absent left foot  Neurological: Light touch sensation diminished left foot  ASSESSMENT / PLAN OF CARE: 1. Left hallux osteomyelitis (HCC)   2. Wound cellulitis   3. 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    Dispense:  20 capsule    Refill:  0   Culture swabs of the wound(s) were obtained today and sent to [facility] labs.  The ulceration was sharply debrided of hyperkeratotic and devitalized soft tissue with sterile #312 blade to the level of capsule.  Minimal bleeding noted.  Hemostasis  obtained.  Antibiotic ointment and DSD applied.  Reviewed off-loading with patient.  Surgical shoe dispensed for patient to wear at all times WB.   Reviewed daily dressing changes with patient.  Patient was given the phone number for the [facility] imaging center to call them back and get the MRI of scheduled and performed as soon as possible.  Orders were written for CBC with differential, sed rate, CRP to evaluate level of infection at this time.  I will renew her doxycycline to extend her coverage until we can get the MRI back in either refer to infectious disease or begin surgical planning for resection of infected bone.  Order placed for [facility] to have wound care supplies sent to the patient's home.  Still awaiting the arterial studies to be performed as well to assess ability to heal.  These were ordered on 11/24/2022.  Discussed risks / concerns regarding  ulcer with patient and possible sequelae if left untreated.  Stressed importance of infection prevention at home. Short-term goals are: Resolved infection, off-load ulcer, heal ulcer Long-term goals are:  prevent recurrence, prevent amputation.   Return in about 2 weeks (around 12/15/2022) for f/u ulcer.   Clerance Lav, DPM, FACFAS [facility]     [address] [place], [place] [address]                Office [phone]  Fax [phone]

## 2022-12-02 ENCOUNTER — Telehealth: Payer: Self-pay | Admitting: Podiatry

## 2022-12-02 DIAGNOSIS — L97524 Non-pressure chronic ulcer of other part of left foot with necrosis of bone: Secondary | ICD-10-CM | POA: Diagnosis not present

## 2022-12-02 LAB — CBC WITH DIFFERENTIAL/PLATELET
Absolute Monocytes: 717 {cells}/uL (ref 200–950)
Basophils Absolute: 34 {cells}/uL (ref 0–200)
Basophils Relative: 0.3 %
Eosinophils Absolute: 11 {cells}/uL — ABNORMAL LOW (ref 15–500)
Eosinophils Relative: 0.1 %
HCT: 36.9 % (ref 35.0–45.0)
Hemoglobin: 11.8 g/dL (ref 11.7–15.5)
Lymphs Abs: 1333 {cells}/uL (ref 850–3900)
MCH: 26.6 pg — ABNORMAL LOW (ref 27.0–33.0)
MCHC: 32 g/dL (ref 32.0–36.0)
MCV: 83.3 fL (ref 80.0–100.0)
MPV: 10.4 fL (ref 7.5–12.5)
Monocytes Relative: 6.4 %
Neutro Abs: 9106 {cells}/uL — ABNORMAL HIGH (ref 1500–7800)
Neutrophils Relative %: 81.3 %
Platelets: 555 10*3/uL — ABNORMAL HIGH (ref 140–400)
RBC: 4.43 10*6/uL (ref 3.80–5.10)
RDW: 14 % (ref 11.0–15.0)
Total Lymphocyte: 11.9 %
WBC: 11.2 10*3/uL — ABNORMAL HIGH (ref 3.8–10.8)

## 2022-12-02 LAB — C-REACTIVE PROTEIN: CRP: 41.6 mg/L — ABNORMAL HIGH (ref <8.0)

## 2022-12-02 LAB — SEDIMENTATION RATE: Sed Rate: 34 mm/h — ABNORMAL HIGH (ref 0–30)

## 2022-12-02 NOTE — Telephone Encounter (Signed)
Called patient's phone number and her sister answered the phone, who has attended all of her appointments and is the contact person for the patient.....  WBC, CRP, and ESR were elevated, but not as high as I was suspecting, which is good.  I think the medicine and wound care has been helping get the infection stabilized.  She still needs to get the MRI and vascular studies done to confirm osteomyelitis of hallux distal phalanx (and if it is also in the proximal phalanx), and confirm ability to heal with blood flow assessment.    She appreciated the call and stated the vascular tests are set up for Friday

## 2022-12-04 ENCOUNTER — Telehealth: Payer: Self-pay | Admitting: Podiatry

## 2022-12-04 ENCOUNTER — Other Ambulatory Visit: Payer: Self-pay | Admitting: Podiatry

## 2022-12-04 DIAGNOSIS — L0390 Cellulitis, unspecified: Secondary | ICD-10-CM

## 2022-12-04 DIAGNOSIS — I70245 Atherosclerosis of native arteries of left leg with ulceration of other part of foot: Secondary | ICD-10-CM

## 2022-12-04 MED ORDER — AMOXICILLIN-POT CLAVULANATE 875-125 MG PO TABS: 1.0000 | ORAL_TABLET | Freq: Two times a day (BID) | ORAL | 0 refills | Status: DC

## 2022-12-04 NOTE — Telephone Encounter (Signed)
Received fax from [facility] that the patient's insurance plan refused their request to provide wound care supply services to the patient.  They forwarded the order to [facility] at [phone] and they sent her a few supplies while awaiting the wound care order from [facility] to be delivered.

## 2022-12-04 NOTE — Telephone Encounter (Signed)
Rec'd fax from [place], as [facility] wasn't permitted to provide her wound supplies as per her insurance so they forwarded it to [employer].  They are requesting notes and supply request.  This was faxed internally through Epic to them today.

## 2022-12-05 ENCOUNTER — Ambulatory Visit (HOSPITAL_COMMUNITY)
Admission: RE | Admit: 2022-12-05 | Discharge: 2022-12-05 | Disposition: A | Discharge status: Home / Self Care | Payer: Medicare Other | Source: Ambulatory Visit | Attending: Podiatry | Admitting: Podiatry

## 2022-12-05 ENCOUNTER — Ambulatory Visit (INDEPENDENT_AMBULATORY_CARE_PROVIDER_SITE_OTHER)
Admission: RE | Admit: 2022-12-05 | Discharge: 2022-12-05 | Disposition: A | Discharge status: Home / Self Care | Payer: Medicare Other | Source: Ambulatory Visit | Attending: Podiatry | Admitting: Podiatry

## 2022-12-05 DIAGNOSIS — I70245 Atherosclerosis of native arteries of left leg with ulceration of other part of foot: Secondary | ICD-10-CM | POA: Insufficient documentation

## 2022-12-05 DIAGNOSIS — M869 Osteomyelitis, unspecified: Secondary | ICD-10-CM | POA: Diagnosis not present

## 2022-12-05 DIAGNOSIS — L97524 Non-pressure chronic ulcer of other part of left foot with necrosis of bone: Secondary | ICD-10-CM | POA: Insufficient documentation

## 2022-12-05 LAB — VAS US ABI WITH/WO TBI
Left ABI: 0.48
Right ABI: 0.44

## 2022-12-05 NOTE — Progress Notes (Signed)
Received staff message that patient had critical level ABI's and vascular wanted Dr. Lenell Antu to see her.  Referral to vascular surgeon placed and marked urgent.

## 2022-12-08 NOTE — Progress Notes (Unsigned)
[facility] [place]  ASSESSMENT / PLAN: Olivia Jackson is a 82 y.o. female with atherosclerosis of native arteries of left lower extremity causing ulceration.  Patient counseled {pad risk2:26283}  WIfI score calculated based on clinical exam and non-invasive measurements. {WIFIvascular:26096}  Recommend:  Abstinence from all tobacco products. Blood glucose control with goal A1c < 7%. Blood pressure control with goal blood pressure < 140/90 mmHg. Lipid reduction therapy with goal LDL-C <100 mg/dL (***<78 if symptomatic from PAD).  Aspirin 81mg  PO QD.  *** Clopidogrel 75mg  PO QD. *** Rivaroxaban 2.5mg  PO BID. *** Cilostozal 100mg  PO BID for intermittent claudication without evidence of heart failure. Atorvastatin 40-80mg  PO QD (or other "high intensity" statin therapy). *** Daily walking to and past the point of discomfort. Patient counseled to keep a log of exercise distance. *** Adequate hydration (at least 2 liters / day) if patient's heart and kidney function is adequate.  Plan *** lower extremity angiogram with possible intervention via *** approach in cath lab ***.    CHIEF COMPLAINT: ***  HISTORY OF PRESENT ILLNESS: Olivia Jackson is a 82 y.o. female ***  VASCULAR SURGICAL HISTORY: ***  VASCULAR RISK FACTORS: {FINDINGS; POSITIVE NEGATIVE:7851389185} history of stroke / transient ischemic attack. {FINDINGS; POSITIVE NEGATIVE:7851389185} history of coronary artery disease. *** history of PCI. *** history of CABG.  {FINDINGS; POSITIVE NEGATIVE:7851389185} history of diabetes mellitus. Last A1c ***. {FINDINGS; POSITIVE NEGATIVE:7851389185} history of smoking. *** actively smoking. {FINDINGS; POSITIVE NEGATIVE:7851389185} history of hypertension. *** drug regimen with *** control. {FINDINGS; POSITIVE NEGATIVE:7851389185} history of chronic kidney disease.  Last GFR ***. CKD {stage:30421363}. {FINDINGS; POSITIVE NEGATIVE:7851389185} history of  chronic obstructive pulmonary disease, treated with ***.  FUNCTIONAL STATUS: ECOG performance status: {findings; ecog performance status:31780} Ambulatory status: {TNHAmbulation:25868}  CAREY 1 AND 3 YEAR INDEX Female (2pts) 75-79 or 80-84 (2pts) >84 (3pts) Dependence in toileting (1pt) Partial or full dependence in dressing (1pt) History of malignant neoplasm (2pts) CHF (3pts) COPD (1pts) CKD (3pts)  0-3 pts 6% 1 year mortality ; 21% 3 year mortality 4-5 pts 12% 1 year mortality ; 36% 3 year mortality >5 pts 21% 1 year mortality; 54% 3 year mortality   Past Medical History:  Diagnosis Date   Anemia    Cancer (HCC) 2016   colon ca   Falls    pt states fell twice this week    History of blood transfusion    12/2014   Hypertension     Past Surgical History:  Procedure Laterality Date   colonscopy      DILATION AND CURETTAGE OF UTERUS     LAPAROSCOPIC PARTIAL COLECTOMY N/A 02/15/2015   Procedure: LAPAROSCOPIC RIGHT COLECTOMY AND SIGMOIDECTOMY PROCTOSCOPY;  Surgeon: Romie Levee, MD;  Location: [facility];  Service: General;  Laterality: N/A;   UPPER GI ENDOSCOPY     UPPER GI ENDOSCOPY  02/15/2015   Procedure: UPPER GI ENDOSCOPY;  Surgeon: Romie Levee, MD;  Location: [facility];  Service: General;;    Family History  Problem Relation Age of Onset   Cancer Father 89       brain tumor     Social History   Socioeconomic History   Marital status: Married    Spouse name: Not on file   Number of children: Not on file   Years of education: Not on file   Highest education level: Not on file  Occupational History   Not on file  Tobacco Use   Smoking status: Former    Current  packs/day: 0.00    Average packs/day: 0.5 packs/day for 20.0 years (10.0 ttl pk-yrs)    Types: Cigarettes    Start date: 03/04/1975    Quit date: 03/04/1995    Years since quitting: 27.7   Smokeless tobacco: Never  Substance and Sexual Activity   Alcohol use: No   Drug use: No   Sexual activity:  Not on file  Other Topics Concern   Not on file  Social History Narrative   Married, husband Mathis Fare   No children   Independent ADLs, drives   Social Determinants of Health   Financial Resource Strain: Not on file  Food Insecurity: Not on file  Transportation Needs: Not on file  Physical Activity: Not on file  Stress: Not on file  Social Connections: Not on file  Intimate Partner Violence: Not on file    No Known Allergies  Current Outpatient Medications  Medication Sig Dispense Refill   acetaminophen (TYLENOL) 325 MG tablet Take 2 tablets (650 mg total) by mouth every 6 (six) hours as needed.     amoxicillin-clavulanate (AUGMENTIN) 875-125 MG tablet Take 1 tablet by mouth 2 (two) times daily. Start this medication after you run out of your doxycycline. 20 tablet 0   doxycycline (VIBRAMYCIN) 100 MG capsule Take 1 capsule (100 mg total) by mouth 2 (two) times daily for 10 days. 20 capsule 0   metFORMIN (GLUCOPHAGE) 500 MG tablet Take 500 mg by mouth daily.     rosuvastatin (CRESTOR) 10 MG tablet TAKE 1 TABLET MON WED FRI FOR CHOLESTEROL ORALLY     silver sulfADIAZINE (SILVADENE) 1 % cream Apply 1 Application topically daily. Apply once daily to wound and cover with gauze dressing 50 g 0   traMADol (ULTRAM) 50 MG tablet Take 1 tablet (50 mg total) by mouth every 6 (six) hours as needed for severe pain. 10 tablet 0   No current facility-administered medications for this visit.    PHYSICAL EXAM There were no vitals filed for this visit.  Constitutional: *** appearing. *** distress. Appears *** nourished.  Neurologic: CN ***. *** focal findings. *** sensory loss. Psychiatric: *** Mood and affect symmetric and appropriate. Eyes: *** No icterus. No conjunctival pallor. Ears, nose, throat: *** mucous membranes moist. Midline trachea.  Cardiac: *** rate and rhythm.  Respiratory: *** unlabored. Abdominal: *** soft, non-tender, non-distended.  Peripheral vascular: *** Extremity:  *** edema. *** cyanosis. *** pallor.  Skin: *** gangrene. *** ulceration.  Lymphatic: *** Stemmer's sign. *** palpable lymphadenopathy.    PERTINENT LABORATORY AND RADIOLOGIC DATA  Most recent CBC    Latest Ref Rng & Units 12/01/2022    1:11 PM 10/13/2019   12:24 PM 10/14/2018    1:01 PM  CBC  WBC 3.8 - 10.8 Thousand/uL 11.2  10.3  9.8   Hemoglobin 11.7 - 15.5 g/dL 32.4  40.1  02.7   Hematocrit 35.0 - 45.0 % 36.9  45.8  44.0   Platelets 140 - 400 Thousand/uL 555  303  304      Most recent CMP    Latest Ref Rng & Units 10/13/2019   12:24 PM 10/14/2018    1:01 PM 04/15/2018    8:40 AM  CMP  Glucose 70 - 99 mg/dL 253  93  664   BUN 8 - 23 mg/dL 9  10  13    Creatinine 0.44 - 1.00 mg/dL 4.03  4.74  2.59   Sodium 135 - 145 mmol/L 142  141  142   Potassium 3.5 -  5.1 mmol/L 4.1  4.2  4.0   Chloride 98 - 111 mmol/L 107  104  104   CO2 22 - 32 mmol/L 23  26  28    Calcium 8.9 - 10.3 mg/dL 16.1  9.4  9.4   Total Protein 6.5 - 8.1 g/dL 7.9  7.5  7.6   Total Bilirubin 0.3 - 1.2 mg/dL 0.6  0.7  0.7   Alkaline Phos 38 - 126 U/L 60  63  113   AST 15 - 41 U/L 18  24  95   ALT 0 - 44 U/L 11  22  112     +-------+-----------+-----------+------------+------------+  ABI/TBIToday's ABIToday's TBIPrevious ABIPrevious TBI  +-------+-----------+-----------+------------+------------+  Right 0.44       0.28                                 +-------+-----------+-----------+------------+------------+  Left  0.48       ulceration                           +-------+-----------+-----------+------------+------------+   Bilateral lower extremity arterial duplex Summary:  Right: 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   Left: Total occlusion noted in the distal superficial femoral artery.   Rande Brunt. Lenell Antu, MD FACS [facility] Phone Number: [phone] 12/08/2022 11:02 AM   Total time spent on preparing this  encounter including chart review, data review, collecting history, examining the patient, coordinating care for this {tnhtimebilling:26202}  Portions of this report may have been transcribed using voice recognition software.  Every effort has been made to ensure accuracy; however, inadvertent computerized transcription errors may still be present.

## 2022-12-08 NOTE — H&P (View-Only) (Signed)
[facility] [place]  ASSESSMENT / PLAN: Olivia Jackson is a 82 y.o. female with atherosclerosis of native arteries of left lower extremity causing ulceration.  Patient counseled patients with chronic limb threatening ischemia have an annual risk of cardiovascular mortality of 25% and a high risk of amputation.   Recommend:  Abstinence from all tobacco products. Blood glucose control with goal A1c < 7%. Blood pressure control with goal blood pressure < 140/90 mmHg. Lipid reduction therapy with goal LDL-C <100 mg/dL  Aspirin 81mg  PO QD.  Atorvastatin 40-80mg  PO QD (or other "high intensity" statin therapy).  Plan left lower extremity angiogram with possible intervention via right common femoral approach in cath lab as soon as schedule allows.   CHIEF COMPLAINT: left great toe ulcer  HISTORY OF PRESENT ILLNESS: Olivia Jackson is a 82 y.o. female referred to clinic for evaluation of left great toe ulcer.  The patient reports this has been present for several weeks.  She feels the ulcer has slightly improved with local wound care.  The ulcer is painful to her.  She does not report any antecedent symptoms of peripheral arterial disease.  Past Medical History:  Diagnosis Date   Anemia    Cancer (HCC) 2016   colon ca   Falls    pt states fell twice this week    History of blood transfusion    12/2014   Hypertension     Past Surgical History:  Procedure Laterality Date   colonscopy      DILATION AND CURETTAGE OF UTERUS     LAPAROSCOPIC PARTIAL COLECTOMY N/A 02/15/2015   Procedure: LAPAROSCOPIC RIGHT COLECTOMY AND SIGMOIDECTOMY PROCTOSCOPY;  Surgeon: Romie Levee, MD;  Location: [facility];  Service: General;  Laterality: N/A;   UPPER GI ENDOSCOPY     UPPER GI ENDOSCOPY  02/15/2015   Procedure: UPPER GI ENDOSCOPY;  Surgeon: Romie Levee, MD;  Location: [facility];  Service: General;;    Family History  Problem Relation Age of Onset   Cancer Father 60        brain tumor     Social History   Socioeconomic History   Marital status: Married    Spouse name: Not on file   Number of children: Not on file   Years of education: Not on file   Highest education level: Not on file  Occupational History   Not on file  Tobacco Use   Smoking status: Former    Current packs/day: 0.00    Average packs/day: 0.5 packs/day for 20.0 years (10.0 ttl pk-yrs)    Types: Cigarettes    Start date: 03/04/1975    Quit date: 03/04/1995    Years since quitting: 27.7   Smokeless tobacco: Never  Substance and Sexual Activity   Alcohol use: No   Drug use: No   Sexual activity: Not on file  Other Topics Concern   Not on file  Social History Narrative   Married, husband Mathis Fare   No children   Independent ADLs, drives   Social Determinants of Health   Financial Resource Strain: Not on file  Food Insecurity: Not on file  Transportation Needs: Not on file  Physical Activity: Not on file  Stress: Not on file  Social Connections: Not on file  Intimate Partner Violence: Not on file    No Known Allergies  Current Outpatient Medications  Medication Sig Dispense Refill   acetaminophen (TYLENOL) 325 MG tablet Take 2 tablets (650 mg total) by mouth every  6 (six) hours as needed.     amoxicillin-clavulanate (AUGMENTIN) 875-125 MG tablet Take 1 tablet by mouth 2 (two) times daily. Start this medication after you run out of your doxycycline. 20 tablet 0   doxycycline (VIBRAMYCIN) 100 MG capsule Take 1 capsule (100 mg total) by mouth 2 (two) times daily for 10 days. 20 capsule 0   metFORMIN (GLUCOPHAGE) 500 MG tablet Take 500 mg by mouth daily.     rosuvastatin (CRESTOR) 10 MG tablet TAKE 1 TABLET MON WED FRI FOR CHOLESTEROL ORALLY     silver sulfADIAZINE (SILVADENE) 1 % cream Apply 1 Application topically daily. Apply once daily to wound and cover with gauze dressing 50 g 0   traMADol (ULTRAM) 50 MG tablet Take 1 tablet (50 mg total) by mouth every 6 (six) hours as  needed for severe pain. 10 tablet 0   No current facility-administered medications for this visit.    PHYSICAL EXAM Vitals:   12/09/22 1019  BP: 114/68  Pulse: 96  Resp: 20  Temp: 97.8 F (36.6 C)  SpO2: 100%  Weight: 118 lb 12.8 oz (53.9 kg)  Height: 5\' 2"  (1.575 m)    Elderly woman in no distress Regular rate and rhythm Unlabored breathing Palpable femoral pulses bilaterally No palpable popliteal pulses bilaterally No palpable pedal pulses bilaterally Left great toe ischemic ulceration about the nailbed measuring about the size of a pencil head  PERTINENT LABORATORY AND RADIOLOGIC DATA  Most recent CBC    Latest Ref Rng & Units 12/01/2022    1:11 PM 10/13/2019   12:24 PM 10/14/2018    1:01 PM  CBC  WBC 3.8 - 10.8 Thousand/uL 11.2  10.3  9.8   Hemoglobin 11.7 - 15.5 g/dL 04.5  40.9  81.1   Hematocrit 35.0 - 45.0 % 36.9  45.8  44.0   Platelets 140 - 400 Thousand/uL 555  303  304      Most recent CMP    Latest Ref Rng & Units 10/13/2019   12:24 PM 10/14/2018    1:01 PM 04/15/2018    8:40 AM  CMP  Glucose 70 - 99 mg/dL 914  93  782   BUN 8 - 23 mg/dL 9  10  13    Creatinine 0.44 - 1.00 mg/dL 9.56  2.13  0.86   Sodium 135 - 145 mmol/L 142  141  142   Potassium 3.5 - 5.1 mmol/L 4.1  4.2  4.0   Chloride 98 - 111 mmol/L 107  104  104   CO2 22 - 32 mmol/L 23  26  28    Calcium 8.9 - 10.3 mg/dL 57.8  9.4  9.4   Total Protein 6.5 - 8.1 g/dL 7.9  7.5  7.6   Total Bilirubin 0.3 - 1.2 mg/dL 0.6  0.7  0.7   Alkaline Phos 38 - 126 U/L 60  63  113   AST 15 - 41 U/L 18  24  95   ALT 0 - 44 U/L 11  22  112     +-------+-----------+-----------+------------+------------+  ABI/TBIToday's ABIToday's TBIPrevious ABIPrevious TBI  +-------+-----------+-----------+------------+------------+  Right 0.44       0.28                                 +-------+-----------+-----------+------------+------------+  Left  0.48       ulceration                            +-------+-----------+-----------+------------+------------+  Bilateral lower extremity arterial duplex Summary:  Right: 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   Left: Total occlusion noted in the distal superficial femoral artery.   Rande Brunt. Lenell Antu, MD FACS [facility] Phone Number: [phone] 12/09/2022 3:02 PM   Total time spent on preparing this encounter including chart review, data review, collecting history, examining the patient, coordinating care for this new patient, 60 minutes.  Portions of this report may have been transcribed using voice recognition software.  Every effort has been made to ensure accuracy; however, inadvertent computerized transcription errors may still be present.

## 2022-12-09 ENCOUNTER — Ambulatory Visit: Payer: Medicare Other | Admitting: Vascular Surgery

## 2022-12-09 ENCOUNTER — Encounter: Payer: Self-pay | Admitting: Vascular Surgery

## 2022-12-09 VITALS — BP 114/68 | HR 96 | Temp 97.8°F | Resp 20 | Ht 62.0 in | Wt 118.8 lb

## 2022-12-09 DIAGNOSIS — I7025 Atherosclerosis of native arteries of other extremities with ulceration: Secondary | ICD-10-CM

## 2022-12-11 ENCOUNTER — Telehealth: Payer: Self-pay | Admitting: Podiatry

## 2022-12-11 NOTE — Telephone Encounter (Signed)
called in to let you know that Olivia Jackson could not get MRI until nov

## 2022-12-15 ENCOUNTER — Other Ambulatory Visit: Payer: Self-pay

## 2022-12-15 DIAGNOSIS — I7025 Atherosclerosis of native arteries of other extremities with ulceration: Secondary | ICD-10-CM

## 2022-12-19 ENCOUNTER — Ambulatory Visit (HOSPITAL_COMMUNITY)
Admission: RE | Admit: 2022-12-19 | Discharge: 2022-12-19 | Disposition: A | Discharge status: Home / Self Care | Payer: Medicare Other | Attending: Vascular Surgery | Admitting: Vascular Surgery

## 2022-12-19 ENCOUNTER — Other Ambulatory Visit (HOSPITAL_COMMUNITY): Payer: Self-pay

## 2022-12-19 ENCOUNTER — Other Ambulatory Visit: Payer: Self-pay

## 2022-12-19 ENCOUNTER — Encounter (HOSPITAL_COMMUNITY)
Admission: RE | Disposition: A | Discharge status: Home / Self Care | Payer: Self-pay | Source: Home / Self Care | Attending: Vascular Surgery

## 2022-12-19 DIAGNOSIS — I70244 Atherosclerosis of native arteries of left leg with ulceration of heel and midfoot: Secondary | ICD-10-CM | POA: Diagnosis not present

## 2022-12-19 DIAGNOSIS — I70245 Atherosclerosis of native arteries of left leg with ulceration of other part of foot: Secondary | ICD-10-CM | POA: Diagnosis not present

## 2022-12-19 DIAGNOSIS — Z87891 Personal history of nicotine dependence: Secondary | ICD-10-CM | POA: Diagnosis not present

## 2022-12-19 DIAGNOSIS — L97529 Non-pressure chronic ulcer of other part of left foot with unspecified severity: Secondary | ICD-10-CM | POA: Diagnosis not present

## 2022-12-19 DIAGNOSIS — I7025 Atherosclerosis of native arteries of other extremities with ulceration: Secondary | ICD-10-CM

## 2022-12-19 HISTORY — PX: ABDOMINAL AORTOGRAM W/LOWER EXTREMITY: CATH118223

## 2022-12-19 HISTORY — PX: PERIPHERAL VASCULAR INTERVENTION: CATH118257

## 2022-12-19 LAB — POCT I-STAT, CHEM 8
BUN: 9 mg/dL (ref 8–23)
Calcium, Ion: 1.04 mmol/L — ABNORMAL LOW (ref 1.15–1.40)
Chloride: 108 mmol/L (ref 98–111)
Creatinine, Ser: 0.8 mg/dL (ref 0.44–1.00)
Glucose, Bld: 56 mg/dL — ABNORMAL LOW (ref 70–99)
HCT: 38 % (ref 36.0–46.0)
Hemoglobin: 12.9 g/dL (ref 12.0–15.0)
Potassium: 4 mmol/L (ref 3.5–5.1)
Sodium: 139 mmol/L (ref 135–145)
TCO2: 22 mmol/L (ref 22–32)

## 2022-12-19 LAB — GLUCOSE, CAPILLARY
Glucose-Capillary: 62 mg/dL — ABNORMAL LOW (ref 70–99)
Glucose-Capillary: 122 mg/dL — ABNORMAL HIGH (ref 70–99)
Glucose-Capillary: 72 mg/dL (ref 70–99)

## 2022-12-19 SURGERY — ABDOMINAL AORTOGRAM W/LOWER EXTREMITY
Anesthesia: LOCAL

## 2022-12-19 MED ORDER — HEPARIN SODIUM (PORCINE) 1000 UNIT/ML IJ SOLN
INTRAMUSCULAR | Status: DC | PRN
  Administered 2022-12-19: 6000 [IU] via INTRAVENOUS

## 2022-12-19 MED ORDER — MIDAZOLAM HCL 2 MG/2ML IJ SOLN
INTRAMUSCULAR | Status: DC | PRN
  Administered 2022-12-19: .5 mg via INTRAVENOUS

## 2022-12-19 MED ORDER — ONDANSETRON HCL 4 MG/2ML IJ SOLN: 4.0000 mg | Freq: Four times a day (QID) | INTRAMUSCULAR | Status: DC | PRN

## 2022-12-19 MED ORDER — HYDRALAZINE HCL 20 MG/ML IJ SOLN: 5.0000 mg | INTRAMUSCULAR | Status: DC | PRN

## 2022-12-19 MED ORDER — ASPIRIN 81 MG PO CHEW
CHEWABLE_TABLET | ORAL | Status: DC | PRN
  Administered 2022-12-19: 324 mg via ORAL

## 2022-12-19 MED ORDER — CLOPIDOGREL BISULFATE 75 MG PO TABS
75.0000 mg | ORAL_TABLET | Freq: Every day | ORAL | 11 refills | Status: AC
  Filled 2022-12-19: qty 30, 30d supply, fill #0

## 2022-12-19 MED ORDER — CLOPIDOGREL BISULFATE 300 MG PO TABS
ORAL_TABLET | ORAL | Status: DC | PRN
  Administered 2022-12-19: 300 mg via ORAL

## 2022-12-19 MED ORDER — CLOPIDOGREL BISULFATE 75 MG PO TABS: 300.0000 mg | ORAL_TABLET | Freq: Once | ORAL | Status: DC

## 2022-12-19 MED ORDER — SODIUM CHLORIDE 0.9 % IV SOLN: INTRAVENOUS | Status: DC

## 2022-12-19 MED ORDER — MIDAZOLAM HCL 2 MG/2ML IJ SOLN
INTRAMUSCULAR | Status: AC
  Filled 2022-12-19: qty 2

## 2022-12-19 MED ORDER — SODIUM CHLORIDE 0.9% FLUSH: 3.0000 mL | INTRAVENOUS | Status: DC | PRN

## 2022-12-19 MED ORDER — HEPARIN SODIUM (PORCINE) 1000 UNIT/ML IJ SOLN
INTRAMUSCULAR | Status: AC
  Filled 2022-12-19: qty 10

## 2022-12-19 MED ORDER — HEPARIN (PORCINE) IN NACL 2000-0.9 UNIT/L-% IV SOLN
INTRAVENOUS | Status: DC | PRN
  Administered 2022-12-19: 1000 mL

## 2022-12-19 MED ORDER — CLOPIDOGREL BISULFATE 75 MG PO TABS: 75.0000 mg | ORAL_TABLET | Freq: Every day | ORAL | Status: DC

## 2022-12-19 MED ORDER — IODIXANOL 320 MG/ML IV SOLN
INTRAVENOUS | Status: DC | PRN
  Administered 2022-12-19: 75 mL via INTRA_ARTERIAL

## 2022-12-19 MED ORDER — ASPIRIN 325 MG PO TBEC: 325.0000 mg | DELAYED_RELEASE_TABLET | Freq: Every day | ORAL | Status: DC

## 2022-12-19 MED ORDER — LIDOCAINE HCL (PF) 1 % IJ SOLN
INTRAMUSCULAR | Status: AC
  Filled 2022-12-19: qty 30

## 2022-12-19 MED ORDER — SODIUM CHLORIDE 0.9 % WEIGHT BASED INFUSION: 1.0000 mL/kg/h | INTRAVENOUS | Status: DC

## 2022-12-19 MED ORDER — ACETAMINOPHEN 325 MG PO TABS: 650.0000 mg | ORAL_TABLET | ORAL | Status: DC | PRN

## 2022-12-19 MED ORDER — ASPIRIN 81 MG PO TBEC
81.0000 mg | DELAYED_RELEASE_TABLET | Freq: Every day | ORAL | 2 refills | Status: AC
  Filled 2022-12-19: qty 120, 120d supply, fill #0

## 2022-12-19 MED ORDER — ASPIRIN 325 MG PO TABS
ORAL_TABLET | ORAL | Status: AC
  Filled 2022-12-19: qty 1

## 2022-12-19 MED ORDER — LIDOCAINE HCL (PF) 1 % IJ SOLN
INTRAMUSCULAR | Status: DC | PRN
  Administered 2022-12-19: 10 mL

## 2022-12-19 MED ORDER — FENTANYL CITRATE (PF) 100 MCG/2ML IJ SOLN
INTRAMUSCULAR | Status: DC | PRN
  Administered 2022-12-19: 25 ug via INTRAVENOUS

## 2022-12-19 MED ORDER — SODIUM CHLORIDE 0.9% FLUSH: 3.0000 mL | Freq: Two times a day (BID) | INTRAVENOUS | Status: DC

## 2022-12-19 MED ORDER — LABETALOL HCL 5 MG/ML IV SOLN: 10.0000 mg | INTRAVENOUS | Status: DC | PRN

## 2022-12-19 MED ORDER — SODIUM CHLORIDE 0.9 % IV SOLN: 250.0000 mL | INTRAVENOUS | Status: DC | PRN

## 2022-12-19 MED ORDER — FENTANYL CITRATE (PF) 100 MCG/2ML IJ SOLN
INTRAMUSCULAR | Status: AC
  Filled 2022-12-19: qty 2

## 2022-12-19 MED ORDER — CLOPIDOGREL BISULFATE 300 MG PO TABS
ORAL_TABLET | ORAL | Status: AC
  Filled 2022-12-19: qty 1

## 2022-12-19 SURGICAL SUPPLY — 18 items
BALLN MUSTANG 5X200X135 (BALLOONS) ×2
BALLOON MUSTANG 5X200X135 (BALLOONS) IMPLANT
CATH OMNI FLUSH 5F 65CM (CATHETERS) IMPLANT
CATH QUICKCROSS SUPP .035X90CM (MICROCATHETER) IMPLANT
CLOSURE PERCLOSE PROSTYLE (VASCULAR PRODUCTS) IMPLANT
GLIDEWIRE ADV .035X260CM (WIRE) IMPLANT
KIT ENCORE 26 ADVANTAGE (KITS) IMPLANT
KIT MICROPUNCTURE NIT STIFF (SHEATH) IMPLANT
KIT PV (KITS) ×2 IMPLANT
SET ATX-X65L (MISCELLANEOUS) IMPLANT
SHEATH CATAPULT 6FR 45 (SHEATH) IMPLANT
SHEATH PINNACLE 5F 10CM (SHEATH) IMPLANT
SHEATH PROBE COVER 6X72 (BAG) IMPLANT
STENT ELUVIA 6X150X130 (Permanent Stent) IMPLANT
STENT ELUVIA 6X80X130 (Permanent Stent) IMPLANT
TRANSDUCER W/STOPCOCK (MISCELLANEOUS) ×2 IMPLANT
TRAY PV CATH (CUSTOM PROCEDURE TRAY) ×2 IMPLANT
WIRE BENTSON .035X145CM (WIRE) IMPLANT

## 2022-12-19 NOTE — Interval H&P Note (Signed)
History and Physical Interval Note:  12/19/2022 12:27 PM  Olivia Jackson  has presented today for surgery, with the diagnosis of Atherosclerosis of native arteries of the extremities with ulceration.  The various methods of treatment have been discussed with the patient and family. After consideration of risks, benefits and other options for treatment, the patient has consented to  Procedure(s): ABDOMINAL AORTOGRAM W/LOWER EXTREMITY (N/A) as a surgical intervention.  The patient's history has been reviewed, patient examined, no change in status, stable for surgery.  I have reviewed the patient's chart and labs.  Questions were answered to the patient's satisfaction.     [name]

## 2022-12-19 NOTE — Op Note (Signed)
DATE OF SERVICE: 12/19/2022  PATIENT:  Olivia Jackson  82 y.o. female  PRE-OPERATIVE DIAGNOSIS:  Atherosclerosis of native arteries of left lower extremity causing ulceration  POST-OPERATIVE DIAGNOSIS:  Same  PROCEDURE:   1) Ultrasound guided right common femoral artery access 2) Aortogram 3) Left lower extremity angiogram with third order cannulation (75mL total contrast) 4) Additional left lower extremity with third order cannulation. 5) Left femoropopliteal angioplasty and stenting (6x123mm, 6x123mm, 6x43mm Eluvia) 6) Conscious sedation (31 minutes)   SURGEON:  Rande Brunt. Lenell Antu, MD  ASSISTANT: none  ANESTHESIA:   local and IV sedation  ESTIMATED BLOOD LOSS: minimal  LOCAL MEDICATIONS USED:  LIDOCAINE   COUNTS: confirmed correct.  PATIENT DISPOSITION:  PACU - hemodynamically stable.   Delay start of Pharmacological VTE agent (>24hrs) due to surgical blood loss or risk of bleeding: no  INDICATION FOR PROCEDURE: Olivia Jackson is a 82 y.o. female with left foot ulceration and non-invasive evidence of significant peripheral arterial disease. After careful discussion of risks, benefits, and alternatives the patient was offered angiography. The patient understood and wished to proceed.  OPERATIVE FINDINGS:  Renal arteries patent bilaterally Infrarenal aorta patent bilaterally Common iliac arteries patent bilaterally Internal iliac arteries patent bilaterally External iliac arteries patent bilaterally  Left lower extremity: Common femoral artery: patent  Profunda femoris artery: patent  Superficial femoral artery: severely diseased. Patent proximally. 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 CTO about in length. Heavily calcified.  Popliteal artery: Reconstitution of above knee popliteal artery. Normal behind knee popliteal artery. Patent below knee popliteal artery Anterior tibial artery: dominant tibial. Patent to the foot.  Tibioperoneal trunk:  patent Peroneal artery: patent to ankle, where it arborizes Posterior tibial artery: patent proximally, but occludes in proximal calf Pedal circulation: disadvantaged. DP with two areas of significant stenosis in foot.  GLASS score. FP 3. IP 0. P 1. Stage II disease.  WIfI score. 1 / 3 / 1. Stage III disease.  DESCRIPTION OF PROCEDURE: After identification of the patient in the pre-operative holding area, the patient was transferred to the operating room. The patient was positioned supine on the operating room table. Anesthesia was induced. The groins was prepped and draped in standard fashion. A surgical pause was performed confirming correct patient, procedure, and operative location.  The right groin was anesthetized with subcutaneous injection of 1% lidocaine. Using ultrasound guidance, the right common femoral artery was accessed with micropuncture technique. Fluoroscopy was used to confirm cannulation over the femoral head. The 27F sheath was upsized to 63F.   A Benson wire was advanced into the distal aorta. Over the wire an omni flush catheter was advanced to the level of L2. Aortogram was performed - see above for details.   The left common iliac artery was selected with an omniflush catheter and glidewire advantage guidewire. The wire was advanced into the common femoral artery. Over the wire the omni flush catheter was advanced into the external iliac artery. Selective angiography was performed - see above for details.   The decision was made to intervene. The patient was heparinized with 6,000 units of heparin. The 63F sheath was exchanged for a 39F x 45cm sheath. Selective angiography of the left lower extremity was performed prior to intervention.   The lesions were treated with:  Left femoropopliteal angioplasty and stenting (6x167mm, 6x180mm, 6x22mm Eluvia)  Completion angiography revealed:  Resolution of SFA occlusion with no residual stenosis  A perclose device was used to  close the arteriotomy. Hemostasis was  excellent upon completion.  Conscious sedation was administered with the use of IV fentanyl and midazolam under continuous physician and nurse monitoring.  Heart rate, blood pressure, and oxygen saturation were continuously monitored.  Total sedation time was 31 minutes  Upon completion of the case instrument and sharps counts were confirmed correct. The patient was transferred to the PACU in good condition. I was present for all portions of the procedure.  PLAN: ASA / Plavix / Statin. Follow up with me in 4 weeks with ABI and LLE duplex.   Rande Brunt. Lenell Antu, MD [facility] Phone Number: [phone] 12/19/2022 1:06 PM

## 2022-12-22 ENCOUNTER — Encounter (HOSPITAL_COMMUNITY): Payer: Self-pay | Admitting: Vascular Surgery

## 2022-12-22 NOTE — Plan of Care (Signed)
[facility] Tonsillectomy/Adenoidectomy, Postoperative PEDS care plan entered in error.

## 2022-12-30 ENCOUNTER — Other Ambulatory Visit: Payer: Medicare Other

## 2023-01-05 ENCOUNTER — Ambulatory Visit
Admission: RE | Admit: 2023-01-05 | Discharge: 2023-01-05 | Disposition: A | Discharge status: Home / Self Care | Payer: Medicare Other | Source: Ambulatory Visit | Attending: Podiatry

## 2023-01-05 DIAGNOSIS — M869 Osteomyelitis, unspecified: Secondary | ICD-10-CM | POA: Diagnosis not present

## 2023-01-05 DIAGNOSIS — R600 Localized edema: Secondary | ICD-10-CM | POA: Diagnosis not present

## 2023-01-05 DIAGNOSIS — L97529 Non-pressure chronic ulcer of other part of left foot with unspecified severity: Secondary | ICD-10-CM | POA: Diagnosis not present

## 2023-01-09 ENCOUNTER — Other Ambulatory Visit: Payer: Self-pay

## 2023-01-09 DIAGNOSIS — I7025 Atherosclerosis of native arteries of other extremities with ulceration: Secondary | ICD-10-CM

## 2023-01-21 ENCOUNTER — Ambulatory Visit (HOSPITAL_COMMUNITY)
Admission: RE | Admit: 2023-01-21 | Discharge: 2023-01-21 | Disposition: A | Discharge status: Home / Self Care | Payer: Medicare Other | Source: Ambulatory Visit | Attending: Vascular Surgery | Admitting: Vascular Surgery

## 2023-01-21 ENCOUNTER — Ambulatory Visit (INDEPENDENT_AMBULATORY_CARE_PROVIDER_SITE_OTHER)
Admit: 2023-01-21 | Discharge: 2023-01-21 | Disposition: A | Discharge status: Home / Self Care | Payer: Medicare Other | Attending: Vascular Surgery | Admitting: Vascular Surgery

## 2023-01-21 DIAGNOSIS — I7025 Atherosclerosis of native arteries of other extremities with ulceration: Secondary | ICD-10-CM | POA: Insufficient documentation

## 2023-01-21 LAB — VAS US ABI WITH/WO TBI
Left ABI: 0.98
Right ABI: 0.54

## 2023-01-26 NOTE — Progress Notes (Unsigned)
[facility] [place]  ASSESSMENT / PLAN: Olivia Jackson is a 82 y.o. female status post left femoropopliteal angioplasty and stenting (6x185mm, 6x123mm, 6x62mm Eluvia) 12/19/22  Recommend:  Abstinence from all tobacco products. Blood glucose control with goal A1c < 7%. Blood pressure control with goal blood pressure < 140/90 mmHg. Lipid reduction therapy with goal LDL-C <100 mg/dL  Aspirin 81mg  PO QD.  Clopidogrel 75mg  PO every day. Atorvastatin 40-80mg  PO QD (or other "high intensity" statin therapy).  Her left great toe is healing well. Non-invasive studies reassuring. Follow up with me in 3 months with repeat ABI / LLE duplex  CHIEF COMPLAINT: left great toe ulcer  HISTORY OF PRESENT ILLNESS: Olivia Jackson is a 82 y.o. female referred to clinic for evaluation of left great toe ulcer.  The patient reports this has been present for several weeks.  She feels the ulcer has slightly improved with local wound care.  The ulcer is painful to her.  She does not report any antecedent symptoms of peripheral arterial disease.  01/27/23: Returns to clinic. Thinks her toe is getting better. She is grateful.   Past Medical History:  Diagnosis Date   Anemia    Cancer (HCC) 2016   colon ca   Falls    pt states fell twice this week    History of blood transfusion    12/2014   Hypertension     Past Surgical History:  Procedure Laterality Date   ABDOMINAL AORTOGRAM W/LOWER EXTREMITY N/A 12/19/2022   Procedure: ABDOMINAL AORTOGRAM W/LOWER EXTREMITY;  Surgeon: [name], MD;  Location: [facility];  Service: Cardiovascular;  Laterality: N/A;   colonscopy      DILATION AND CURETTAGE OF UTERUS     LAPAROSCOPIC PARTIAL COLECTOMY N/A 02/15/2015   Procedure: LAPAROSCOPIC RIGHT COLECTOMY AND SIGMOIDECTOMY PROCTOSCOPY;  Surgeon: Romie Levee, MD;  Location: [facility];  Service: General;  Laterality: N/A;   PERIPHERAL VASCULAR INTERVENTION  12/19/2022    Procedure: PERIPHERAL VASCULAR INTERVENTION;  Surgeon: [name], MD;  Location: [facility];  Service: Cardiovascular;;   UPPER GI ENDOSCOPY     UPPER GI ENDOSCOPY  02/15/2015   Procedure: UPPER GI ENDOSCOPY;  Surgeon: Romie Levee, MD;  Location: [facility];  Service: General;;    Family History  Problem Relation Age of Onset   Cancer Father 39       brain tumor     Social History   Socioeconomic History   Marital status: Married    Spouse name: Not on file   Number of children: Not on file   Years of education: Not on file   Highest education level: Not on file  Occupational History   Not on file  Tobacco Use   Smoking status: Former    Current packs/day: 0.00    Average packs/day: 0.5 packs/day for 20.0 years (10.0 ttl pk-yrs)    Types: Cigarettes    Start date: 03/04/1975    Quit date: 03/04/1995    Years since quitting: 27.9   Smokeless tobacco: Never  Substance and Sexual Activity   Alcohol use: No   Drug use: No   Sexual activity: Not on file  Other Topics Concern   Not on file  Social History Narrative   Married, husband Mathis Fare   No children   Independent ADLs, drives   Social Determinants of Health   Financial Resource Strain: Not on file  Food Insecurity: Not on file  Transportation Needs:  Not on file  Physical Activity: Not on file  Stress: Not on file  Social Connections: Not on file  Intimate Partner Violence: Not on file    No Known Allergies  Current Outpatient Medications  Medication Sig Dispense Refill   acetaminophen (TYLENOL) 325 MG tablet Take 2 tablets (650 mg total) by mouth every 6 (six) hours as needed.     amoxicillin-clavulanate (AUGMENTIN) 875-125 MG tablet Take 1 tablet by mouth 2 (two) times daily. Start this medication after you run out of your doxycycline. 20 tablet 0   aspirin EC 81 MG tablet Take 1 tablet (81 mg total) by mouth daily. Swallow whole. 150 tablet 2   clopidogrel (PLAVIX) 75 MG tablet Take 1 tablet (75  mg total) by mouth daily. 30 tablet 11   doxycycline (VIBRAMYCIN) 100 MG capsule Take 100 mg by mouth 2 (two) times daily.     metFORMIN (GLUCOPHAGE) 500 MG tablet Take 500 mg by mouth daily with supper.     rosuvastatin (CRESTOR) 10 MG tablet TAKE 1 TABLET MON WED FRI FOR CHOLESTEROL ORALLY     silver sulfADIAZINE (SILVADENE) 1 % cream Apply 1 Application topically daily. Apply once daily to wound and cover with gauze dressing 50 g 0   No current facility-administered medications for this visit.    PHYSICAL EXAM Vitals:   01/27/23 1325  BP: 90/68  Pulse: 68  Resp: 20  Temp: 97.7 F (36.5 C)  SpO2: 95%  Weight: 121 lb 11.2 oz (55.2 kg)  Height: 5\' 2"  (1.575 m)     Elderly woman in no distress Regular rate and rhythm Unlabored breathing Palpable femoral pulses bilaterally No palpable popliteal pulses bilaterally No palpable pedal pulses bilaterally Left great toe ischemic ulceration nearly resolved.  PERTINENT LABORATORY AND RADIOLOGIC DATA  Most recent CBC    Latest Ref Rng & Units 12/19/2022   11:53 AM 12/01/2022    1:11 PM 10/13/2019   12:24 PM  CBC  WBC 3.8 - 10.8 Thousand/uL  11.2  10.3   Hemoglobin 12.0 - 15.0 g/dL 16.1  09.6  04.5   Hematocrit 36.0 - 46.0 % 38.0  36.9  45.8   Platelets 140 - 400 Thousand/uL  555  303      Most recent CMP    Latest Ref Rng & Units 12/19/2022   11:53 AM 10/13/2019   12:24 PM 10/14/2018    1:01 PM  CMP  Glucose 70 - 99 mg/dL 56  409  93   BUN 8 - 23 mg/dL 9  9  10    Creatinine 0.44 - 1.00 mg/dL 8.11  9.14  7.82   Sodium 135 - 145 mmol/L 139  142  141   Potassium 3.5 - 5.1 mmol/L 4.0  4.1  4.2   Chloride 98 - 111 mmol/L 108  107  104   CO2 22 - 32 mmol/L  23  26   Calcium 8.9 - 10.3 mg/dL  95.6  9.4   Total Protein 6.5 - 8.1 g/dL  7.9  7.5   Total Bilirubin 0.3 - 1.2 mg/dL  0.6  0.7   Alkaline Phos 38 - 126 U/L  60  63   AST 15 - 41 U/L  18  24   ALT 0 - 44 U/L  11  22     +-------+-----------+------------------------------+------------+----------  --+  ABI/TBIToday's ABIToday's TBI                   Previous ABIPrevious  TBI  +-------+-----------+------------------------------+------------+----------  --+  Right 0.54       0.51                          0.44        0.28           +-------+-----------+------------------------------+------------+----------  --+  Left  0.98       waveform present/ulcer remains0.48        ulcer          +-------+-----------+------------------------------+------------+----------  --+   Left lower extremity arterial duplex: Elevated velocity at the stent inflow (origin SFA) in the 50-74%  range.  Elevated velocity at the origin of the stent.    Rande Brunt. Lenell Antu, MD [facility] Phone Number: [phone] 01/27/2023 1:44 PM   Total time spent on preparing this encounter including chart review, data review, collecting history, examining the patient, coordinating care for this new patient, 60 minutes.  Portions of this report may have been transcribed using voice recognition software.  Every effort has been made to ensure accuracy; however, inadvertent computerized transcription errors may still be present.

## 2023-01-27 ENCOUNTER — Ambulatory Visit (INDEPENDENT_AMBULATORY_CARE_PROVIDER_SITE_OTHER): Payer: Medicare Other | Admitting: Vascular Surgery

## 2023-01-27 ENCOUNTER — Encounter: Payer: Self-pay | Admitting: Vascular Surgery

## 2023-01-27 VITALS — BP 90/68 | HR 68 | Temp 97.7°F | Resp 20 | Ht 62.0 in | Wt 121.7 lb

## 2023-01-27 DIAGNOSIS — I7025 Atherosclerosis of native arteries of other extremities with ulceration: Secondary | ICD-10-CM | POA: Diagnosis not present

## 2023-02-03 ENCOUNTER — Other Ambulatory Visit: Payer: Self-pay

## 2023-02-03 DIAGNOSIS — I7025 Atherosclerosis of native arteries of other extremities with ulceration: Secondary | ICD-10-CM

## 2023-02-09 ENCOUNTER — Encounter: Payer: Self-pay | Admitting: Podiatry

## 2023-02-09 ENCOUNTER — Ambulatory Visit (INDEPENDENT_AMBULATORY_CARE_PROVIDER_SITE_OTHER): Payer: Medicare Other

## 2023-02-09 ENCOUNTER — Ambulatory Visit: Payer: Medicare Other | Admitting: Podiatry

## 2023-02-09 DIAGNOSIS — M869 Osteomyelitis, unspecified: Secondary | ICD-10-CM | POA: Diagnosis not present

## 2023-02-09 MED ORDER — DOXYCYCLINE HYCLATE 100 MG PO CAPS: 100.0000 mg | ORAL_CAPSULE | Freq: Two times a day (BID) | ORAL | 0 refills | Status: DC

## 2023-02-09 MED ORDER — SILVER SULFADIAZINE 1 % EX CREA: 1.0000 | TOPICAL_CREAM | Freq: Every day | CUTANEOUS | 0 refills | Status: AC

## 2023-02-09 NOTE — Progress Notes (Signed)
Chief Complaint  Patient presents with   Foot Ulcer    Follow up ulcer/osteomyelitis hallux left  "Its all healed up and don't hurt"   HPI: 82 y.o. female presents today for follow-up of left hallux ulceration and to discuss her MRI results.  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  Patient states that she has recovered well and she is no longer having any pain in the toe and that the toe has healed.  Her sister is with her today.  Past Medical History:  Diagnosis Date   Anemia    Cancer (HCC) 2016   colon ca   Falls    pt states fell twice this week    History of blood transfusion    12/2014   Hypertension     Past Surgical History:  Procedure Laterality Date   ABDOMINAL AORTOGRAM W/LOWER EXTREMITY N/A 12/19/2022   Procedure: ABDOMINAL AORTOGRAM W/LOWER EXTREMITY;  Surgeon: [name], MD;  Location: [facility];  Service: Cardiovascular;  Laterality: N/A;   colonscopy      DILATION AND CURETTAGE OF UTERUS     LAPAROSCOPIC PARTIAL COLECTOMY N/A 02/15/2015   Procedure: LAPAROSCOPIC RIGHT COLECTOMY AND SIGMOIDECTOMY PROCTOSCOPY;  Surgeon: Romie Levee, MD;  Location: [facility];  Service: General;  Laterality: N/A;   PERIPHERAL VASCULAR INTERVENTION  12/19/2022   Procedure: PERIPHERAL VASCULAR INTERVENTION;  Surgeon: [name], MD;  Location: [facility];  Service: Cardiovascular;;   UPPER GI ENDOSCOPY     UPPER GI ENDOSCOPY  02/15/2015   Procedure: UPPER GI ENDOSCOPY;  Surgeon: Romie Levee, MD;  Location: [facility];  Service: General;;   No Known Allergies  Physical Exam: There are palpable pulses to the left foot with normal proximal to distal cooling noted.  There is immediate capillary refill to the toes.  Interspaces are clear of maceration and debris.  The left hallux ulceration has closed.  There is minimal to no edema  in the area.  There is no calor or dolor noted.  No areas of necrosis are seen.  The distal left hallux ulceration site is hyperkeratotic but otherwise uneventful on clinical exam.  MRI Interpretation (study performed on 01/05/23):  Narrative & Impression  CLINICAL DATA:  Great toe ulceration, bony destructive findings in the distal phalanx great toe on radiography   EXAM: MRI OF THE LEFT FOOT WITHOUT CONTRAST   TECHNIQUE: Multiplanar, multisequence MR imaging of the right forefoot was performed. No intravenous contrast was administered.   COMPARISON:  Radiographs 11/24/2022   FINDINGS: Bones/Joint/Cartilage   Active osteomyelitis of the proximal and distal phalanges of the great toe, with extensive marrow edema and bony destructive findings distally in the distal phalanx. No findings of active osteomyelitis elsewhere in the forefoot.   Ligaments   Lisfranc ligament intact.   Muscles and Tendons   Serpentine and lax flexor hallucis longus tendon compatible with distal detachment. Moderate regional muscular atrophy.   Soft tissues   Extensive subcutaneous edema and questionable speckled gas density along the cutaneous and subcutaneous tissues in the right great toe and extending along the medial distal forefoot. Mild dorsal subcutaneous edema in the distal forefoot.   IMPRESSION: 1. Active osteomyelitis of the proximal and distal phalanges of the great toe. 2. Extensive subcutaneous edema and questionable speckled gas density along the cutaneous and subcutaneous tissues in the right great  toe and extending along the medial distal forefoot. 3. Serpentine and lax flexor hallucis longus tendon compatible with distal detachment. 4. Moderate regional muscular atrophy.     Electronically Signed   By: Gaylyn Rong M.D.   On: 01/27/2023 14:07    Assessment/Plan of Care: 1. Left hallux osteomyelitis (HCC)      Meds ordered this encounter  Medications    doxycycline (VIBRAMYCIN) 100 MG capsule    Sig: Take 1 capsule (100 mg total) by mouth 2 (two) times daily for 10 days.    Dispense:  20 capsule    Refill:  0   silver sulfADIAZINE (SILVADENE) 1 % cream    Sig: Apply 1 Application topically daily. Apply once daily to wound and cover with gauze dressing    Dispense:  50 g    Refill:  0   AMB REFERRAL TO INFECTIOUS DISEASE  New x-rays were obtained of the left hallux today and compared to the previous x-rays in September 2024: No significant bony destructive changes are noted in comparison to the findings in September 2024 where the distal portion of the distal phalanx was lytic.  Discussed clinical MRI findings with patient and her sister today.  Informed them that the distal and partial proximal phalanx of left hallux showed osteomyelitis as per the radiologist's report.  Discussed risks of not addressing/treating this.  The patient does not feel that there were any issues since the wound has closed and she does not have pain.  She is not interested in having any surgical intervention for resection of the infected bone of the hallux/amputation.  Therefore will consult infectious disease for evaluation for 6-8 weeks of oral antibiotic therapy to see if she can avoid surgery/amputation.  They were agreeable to this.  While awaiting her appointment/referral to infectious disease, will restart her on doxycycline 100 mg twice daily as well as Silvadene cream to massage into the skin of the toe once daily.  No dressing is needed.  The hyperkeratosis and flaking skin around the prior ulcer site was gently shaved and removed with a sterile #313 blade.  Clinically, the patient was informed her toe appears much better than previous visits since having her revascularization procedure performed.  If the patient is not able to be seen by infectious disease within the next 3 weeks she will need to follow-up with [place] again for reevaluation and possible renewal  of her antibiotics.  Clerance Lav, DPM, FACFAS [facility]     [address] [place], [place] [address]                Office [phone]  Fax [phone]

## 2023-02-19 ENCOUNTER — Other Ambulatory Visit: Payer: Self-pay

## 2023-02-19 ENCOUNTER — Telehealth: Payer: Self-pay

## 2023-02-19 ENCOUNTER — Encounter: Payer: Self-pay | Admitting: Infectious Diseases

## 2023-02-19 ENCOUNTER — Ambulatory Visit: Payer: Medicare Other | Admitting: Infectious Diseases

## 2023-02-19 VITALS — BP 135/64 | HR 73 | Resp 16 | Ht 62.0 in

## 2023-02-19 DIAGNOSIS — Z79899 Other long term (current) drug therapy: Secondary | ICD-10-CM | POA: Diagnosis not present

## 2023-02-19 DIAGNOSIS — M869 Osteomyelitis, unspecified: Secondary | ICD-10-CM | POA: Diagnosis not present

## 2023-02-19 DIAGNOSIS — I739 Peripheral vascular disease, unspecified: Secondary | ICD-10-CM | POA: Insufficient documentation

## 2023-02-19 MED ORDER — DOXYCYCLINE HYCLATE 100 MG PO CAPS: 100.0000 mg | ORAL_CAPSULE | Freq: Two times a day (BID) | ORAL | 0 refills | Status: DC

## 2023-02-19 MED ORDER — AMOXICILLIN-POT CLAVULANATE 875-125 MG PO TABS: 1.0000 | ORAL_TABLET | Freq: Two times a day (BID) | ORAL | 0 refills | Status: DC

## 2023-02-19 MED ORDER — LEVOFLOXACIN 750 MG PO TABS: 750.0000 mg | ORAL_TABLET | Freq: Every day | ORAL | 0 refills | Status: DC

## 2023-02-19 NOTE — Telephone Encounter (Signed)
Spoke to patient and advised change in oral abx from Levoflaxin to Augmentin based on wound culture results. Also advised [facility].

## 2023-02-19 NOTE — Progress Notes (Addendum)
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   ASPIRIN EC 81 MG TABLET    Take 1 tablet (81 mg total) by mouth daily. Swallow whole.   CLOPIDOGREL (PLAVIX) 75 MG TABLET    Take 1 tablet (75 mg total) by mouth daily.   DOXYCYCLINE (VIBRAMYCIN) 100 MG CAPSULE    Take 1 capsule (100 mg total) by mouth 2 (two) times daily for 10 days.   METFORMIN (GLUCOPHAGE) 500 MG TABLET    Take 500 mg by mouth daily with supper.   ROSUVASTATIN (CRESTOR) 10 MG TABLET    TAKE 1 TABLET MON WED FRI FOR CHOLESTEROL ORALLY   SILVER SULFADIAZINE (SILVADENE) 1 % CREAM    Apply 1 Application topically daily. Apply once daily to wound and cover with gauze dressing  Modified Medications   No medications on file  Discontinued Medications   No medications on file    Subjective: Discussed the use of AI scribe software for clinical note transcription with the patient, who gave verbal consent to proceed.   82 Y O Female with PMH of colon ca, HTN, falls who is referred from Podiatry for MRI left foot concerning for osteomyelitis of left great toe.   Unable to tell me exactly when the left great toe wound started but says it has been there for several weeks. Initially seen by Podiatry 9/23 with loose lifting left hallux nail for a month which was removed. Prescribed 10 days course of po doxycycline. Xrays per report Osteolysis of the distal phalanx of the hallux. Seen on 9/30 and continued on doxycycline and cultures taken, planned for MRI. 10/3 prescribed another 10 days course of po augmentin.  She was seen by Vascular  10/8 when reported to have left great toe wound for several weeks and underwent angiogram of with left femoropopliteal angioplasty and stenting  on 10/18. She was then referred to Podiatry 12/9 due to MRI 11/26 findings concerning for osteomyelitis who then referred to [place].  02/19/23 Taking doxycycline, thinks wound was bleeding before but not anymore and it is drying up. Denies any pain, swelling and tenderness. Denies fevers, chills. Denies nausea, vomiting, abdominal pain or diarrhea. Denies cough, chest pain and SOB. Denies GU symptoms or rashes.  Quit smoking and alcohol long time ago.  No new complaints  Review of Systems: all systems reviewed and negative except stated as above  Past Medical History:  Diagnosis Date   Anemia    Cancer (HCC) 2016   colon ca   Falls    pt states fell twice this week    History of blood transfusion    12/2014   Hypertension    Past Surgical History:  Procedure Laterality Date   ABDOMINAL AORTOGRAM W/LOWER EXTREMITY N/A 12/19/2022   Procedure: ABDOMINAL AORTOGRAM W/LOWER EXTREMITY;  Surgeon: [name], MD;  [facility]: [facility];  Service: Cardiovascular;  Laterality: N/A;   colonscopy      DILATION AND CURETTAGE OF UTERUS     LAPAROSCOPIC PARTIAL COLECTOMY N/A 02/15/2015   Procedure: LAPAROSCOPIC RIGHT  COLECTOMY AND SIGMOIDECTOMY PROCTOSCOPY;  Surgeon: Romie Levee, MD;  Location: [facility];  Service: General;  Laterality: N/A;   PERIPHERAL VASCULAR INTERVENTION  12/19/2022   Procedure: PERIPHERAL VASCULAR INTERVENTION;  Surgeon: [name], MD;  [facility]: [facility];  Service: Cardiovascular;;   UPPER GI ENDOSCOPY     UPPER GI ENDOSCOPY  02/15/2015   Procedure: UPPER GI ENDOSCOPY;  Surgeon: Romie Levee, MD;  Location: [facility];  Service: General;;    Social History   Tobacco Use   Smoking status: Former    Current packs/day: 0.00    Average packs/day: 0.5 packs/day for 20.0 years (10.0 ttl pk-yrs)    Types:  Cigarettes    Start date: 03/04/1975    Quit date: 03/04/1995    Years since quitting: 27.9   Smokeless tobacco: Never  Substance Use Topics   Alcohol use: No   Drug use: No    Family History  Problem Relation Age of Onset   Cancer Father 66       brain tumor     No Known Allergies  Health Maintenance  Topic Date Due   COVID-19 Vaccine (1) Never done   Pneumonia Vaccine 27+ Years old (1 of 2 - PCV) Never done   DTaP/Tdap/Td (1 - Tdap) Never done   Zoster Vaccines- Shingrix (1 of 2) Never done   DEXA SCAN  Never done   INFLUENZA VACCINE  10/02/2022   Medicare Annual Wellness (AWV)  05/19/2023   HPV VACCINES  Aged Out    Objective: BP 135/64   Pulse 73   Resp 16   Ht 5\' 2"  (1.575 m)   BMI 22.26 kg/m    Physical Exam Constitutional:      Appearance: Normal appearance.  HENT:     Head: Normocephalic and atraumatic.      Mouth: Mucous membranes are moist.  Eyes:    Conjunctiva/sclera: Conjunctivae normal.     Pupils: Pupils are equal, round, and bilaterally symmetrical   Cardiovascular:     Rate and Rhythm: Normal rate and regular rhythm.     Heart sounds: s1s2  Pulmonary:     Effort: Pulmonary effort is normal.     Breath sounds: Normal breath sounds.   Abdominal:     General: Non distended     Palpations: soft.   Musculoskeletal:        General: Normal range of motion. Left great toe wound at the tip, appears to be healing with no signs of ongoing infection, no drainage, tenderness of fluctuance or active cellulitis.   Skin:    General: Skin is warm and dry.     Comments:  Neurological:     General: grossly non focal     Mental Status: awake, alert and oriented to person, place, and time.   Psychiatric:        Mood and Affect: Mood normal.   Lab Results Lab Results  Component Value Date   WBC 11.2 (H) 12/01/2022   HGB 12.9 12/19/2022   HCT 38.0 12/19/2022   MCV 83.3 12/01/2022   PLT 555 (H) 12/01/2022    Lab Results  Component Value Date    CREATININE 0.80 12/19/2022   BUN 9 12/19/2022   NA 139 12/19/2022   K 4.0 12/19/2022   CL 108 12/19/2022   CO2 23 10/13/2019    Lab Results  Component Value Date   ALT 11 10/13/2019   AST 18 10/13/2019   ALKPHOS 60 10/13/2019   BILITOT  0.6 10/13/2019    No results found for: "CHOL", "HDL", "LDLCALC", "LDLDIRECT", "TRIG", "CHOLHDL" No results found for: "LABRPR", "RPRTITER" No results found for: "HIV1RNAQUANT", "HIV1RNAVL", "CD4TABS"   Imaging DG Foot Complete Left Result Date: 02/09/2023 Please see detailed radiograph report in office note.  VAS [place] ABI WITH/WO TBI Result Date: 01/21/2023  LOWER EXTREMITY DOPPLER STUDY Patient Name:  LEAR KAINA  Date of Exam:   01/21/2023 Medical Rec #: [id number]           Accession #:    [id number] Date of Birth: September 10, 1940           Patient Gender: F Patient Age:   45 years Exam Location:  [facility] Procedure:      VAS [place] ABI WITH/WO TBI Referring Phys: Heath Lark --------------------------------------------------------------------------------   Vascular Interventions: 12/09/22: Left femoropopliteal angioplasty and stenting                         (6x165mm, 6x134mm, 6x46mm Eluvia). Comparison Study: Previous exam pre op 12/05/22: Occluded SFA. Performing Technologist: Thereasa Parkin RVT  Examination Guidelines: A complete evaluation includes at minimum, Doppler waveform signals and systolic blood pressure reading at the level of bilateral brachial, anterior tibial, and posterior tibial arteries, when vessel segments are accessible. Bilateral testing is considered an integral part of a complete examination. Photoelectric Plethysmograph (PPG) waveforms and toe systolic pressure readings are included as required and additional duplex testing as needed. Limited examinations for reoccurring indications may be performed as noted.  ABI Findings: +---------+------------------+-----+----------+--------+ Right    Rt Pressure  (mmHg)IndexWaveform  Comment  +---------+------------------+-----+----------+--------+ Brachial 162                                       +---------+------------------+-----+----------+--------+ ATA      87                0.54 monophasic         +---------+------------------+-----+----------+--------+ PTA      0                 0.00 absent             +---------+------------------+-----+----------+--------+ Great Toe83                0.51                    +---------+------------------+-----+----------+--------+ +---------+------------------+-----+----------+-------+ Left     Lt Pressure (mmHg)IndexWaveform  Comment +---------+------------------+-----+----------+-------+ Brachial 104                                      +---------+------------------+-----+----------+-------+ ATA      158               0.98 monophasic        +---------+------------------+-----+----------+-------+ PTA      108               0.67 monophasic        +---------+------------------+-----+----------+-------+ Great Toe                       present           +---------+------------------+-----+----------+-------+ +-------+-----------+------------------------------+------------+------------+ ABI/TBIToday's ABIToday's TBI  Previous ABIPrevious TBI +-------+-----------+------------------------------+------------+------------+ Right  0.54       0.51                          0.44        0.28         +-------+-----------+------------------------------+------------+------------+ Left   0.98       waveform present/ulcer remains0.48        ulcer        +-------+-----------+------------------------------+------------+------------+  Previous ABI on 12/05/22 Note difference in brachial pressures.  Summary: Right: Resting right ankle-brachial index indicates moderate right lower extremity arterial disease. The right toe-brachial index is abnormal. Left: Resting  left ankle-brachial index is within normal range. Great toe waveform present. Although ankle brachial indices are within normal limits (0.95-1.29), arterial Doppler waveforms at the ankle suggest some component of arterial occlusive disease. *See table(s) above for measurements and observations.  Electronically signed by Lemar Livings MD on 01/21/2023 at 1:07:27 PM.    Final    VAS [place] LOWER EXTREMITY ARTERIAL DUPLEX Result Date: 01/21/2023 LOWER EXTREMITY ARTERIAL DUPLEX STUDY Patient Name:  NILAM EASTLING  Date of Exam:   01/21/2023 Medical Rec #: [id number]           Accession #:    [id number] Date of Birth: 21-Mar-1940           Patient Gender: F Patient Age:   63 years Exam Location:  [facility] Procedure:      VAS [place] LOWER EXTREMITY ARTERIAL DUPLEX Referring Phys: Heath Lark --------------------------------------------------------------------------------   Vascular Interventions: 12/09/22: Left femoropopliteal angioplasty and stenting                         (6x159mm, 6x154mm, 6x19mm Eluvia). Current ABI:            Right 0.54/0.51 Left 0.98/waveform present Comparison Study: Previous exam pre op 12/05/22: Occluded SFA. Performing Technologist: Thereasa Parkin RVT  Examination Guidelines: A complete evaluation includes B-mode imaging, spectral Doppler, color Doppler, and power Doppler as needed of all accessible portions of each vessel. Bilateral testing is considered an integral part of a complete examination. Limited examinations for reoccurring indications may be performed as noted.  +-----------+--------+-----+--------+----------+--------+ LEFT       PSV cm/sRatioStenosisWaveform  Comments +-----------+--------+-----+--------+----------+--------+ CFA Distal 127                  biphasic           +-----------+--------+-----+--------+----------+--------+ DFA        76                   biphasic           +-----------+--------+-----+--------+----------+--------+  SFA Prox                                  stent    +-----------+--------+-----+--------+----------+--------+ SFA Mid                                   stent    +-----------+--------+-----+--------+----------+--------+ SFA Distal                                stent    +-----------+--------+-----+--------+----------+--------+ POP Prox  stent    +-----------+--------+-----+--------+----------+--------+ ATA Distal 102                  monophasic         +-----------+--------+-----+--------+----------+--------+ PTA Distal 23                   monophasic         +-----------+--------+-----+--------+----------+--------+ PERO Distal44                   monophasic         +-----------+--------+-----+--------+----------+--------+  Left Stent(s): +----------------+--------+--------+----------+--------+ SFA to poplitealPSV cm/sStenosisWaveform  Comments +----------------+--------+--------+----------+--------+ Prox to Stent   284             monophasic         +----------------+--------+--------+----------+--------+ Proximal Stent  274             monophasicwith PST +----------------+--------+--------+----------+--------+ Mid Stent       45              monophasic         +----------------+--------+--------+----------+--------+ Distal Stent    82              monophasic         +----------------+--------+--------+----------+--------+ Distal to Stent 76              monophasic         +----------------+--------+--------+----------+--------+  Summary: Left: Elevated velocity at the stent inflow (origin SFA) in the 50-74% range. Elevated velocity at the origin of the stent.  See table(s) above for measurements and observations. Electronically signed by Lemar Livings MD on 01/21/2023 at 1:07:23 PM.    Final    MRI Left foot 11/26 IMPRESSION: 1. Active osteomyelitis of the proximal and distal phalanges of  the great toe. 2. Extensive subcutaneous edema and questionable speckled gas density along the cutaneous and subcutaneous tissues in the right great toe and extending along the medial distal forefoot. 3. Serpentine and lax flexor hallucis longus tendon compatible with distal detachment. 4. Moderate regional muscular atrophy.  Assessment/Plan # Left Great toe osteomyelitis/wound - abtx as above - last On doxycycline since 12/9 - 9/30 wound cx polymicrobial ( Actinotignum schaali, Coagulase negative staph, E faecalis, Finegoldia magna, Peptoniphilus asaccharolyticus, peptoniphilus harei, Peptoniphilus ivorii, Staph lugdunensis - 9/30 CRP 41, ESR 34  Plan  - Continue doxycycline 100 mg po bid , will add augmentin 875/125 mg po bid   to complete 4 to 6 weeks course of abtx for probable osteomyelitis. Wound could be vascular esp with locaion but unable to r/o osteomyelitis without biopsy  10/18 EKG with qtc 444 - labs today  - Fu in 4 weeks  - Fu in podiatry as instructed for wound care  # PAD  - 11/20 ABI some component of arterial occlusive disease  - follows Vascular   I have personally spent 68 minutes involved in face-to-face and non-face-to-face activities for this patient on the day of the visit. Professional time spent includes the following activities: Preparing to see the patient (review of tests), Obtaining and/or reviewing separately obtained history (admission/discharge record), Performing a medically appropriate examination and/or evaluation , Ordering medications/tests/procedures, referring and communicating with other health care professionals, Documenting clinical information in the EMR, Independently interpreting results (not separately reported), Communicating results to the patient/family/caregiver, Counseling and educating the patient/family/caregiver and Care coordination (not separately reported).   Of note, portions of this note may have been created with voice  recognition software. While this note  has been edited for accuracy, occasional wrong-word or 'sound-a-like' substitutions may have occurred due to the inherent limitations of voice recognition software.   Victoriano Lain, MD [facility] 02/19/2023, 7:48 AM

## 2023-02-20 LAB — COMPREHENSIVE METABOLIC PANEL
AG Ratio: 1.6 (calc) (ref 1.0–2.5)
ALT: 8 U/L (ref 6–29)
AST: 17 U/L (ref 10–35)
Albumin: 4.5 g/dL (ref 3.6–5.1)
Alkaline phosphatase (APISO): 56 U/L (ref 37–153)
BUN/Creatinine Ratio: 16 (calc) (ref 6–22)
BUN: 16 mg/dL (ref 7–25)
CO2: 23 mmol/L (ref 20–32)
Calcium: 10 mg/dL (ref 8.6–10.4)
Chloride: 103 mmol/L (ref 98–110)
Creat: 0.98 mg/dL — ABNORMAL HIGH (ref 0.60–0.95)
Globulin: 2.8 g/dL (ref 1.9–3.7)
Glucose, Bld: 75 mg/dL (ref 65–99)
Potassium: 4 mmol/L (ref 3.5–5.3)
Sodium: 141 mmol/L (ref 135–146)
Total Bilirubin: 0.8 mg/dL (ref 0.2–1.2)
Total Protein: 7.3 g/dL (ref 6.1–8.1)

## 2023-02-20 LAB — CBC
HCT: 43 % (ref 35.0–45.0)
Hemoglobin: 13.9 g/dL (ref 11.7–15.5)
MCH: 27.1 pg (ref 27.0–33.0)
MCHC: 32.3 g/dL (ref 32.0–36.0)
MCV: 83.8 fL (ref 80.0–100.0)
MPV: 11.7 fL (ref 7.5–12.5)
Platelets: 312 10*3/uL (ref 140–400)
RBC: 5.13 10*6/uL — ABNORMAL HIGH (ref 3.80–5.10)
RDW: 13.7 % (ref 11.0–15.0)
WBC: 9.2 10*3/uL (ref 3.8–10.8)

## 2023-02-20 LAB — C-REACTIVE PROTEIN: CRP: <3 mg/L (ref <8.0)

## 2023-02-20 LAB — SEDIMENTATION RATE: Sed Rate: 11 mm/h (ref 0–30)

## 2023-02-23 ENCOUNTER — Telehealth: Payer: Self-pay

## 2023-02-23 NOTE — Telephone Encounter (Signed)
-----   Message from Victoriano Lain sent at 02/23/2023  8:13 AM EST ----- Please let her know lab work is negative for any significant abnormality with normal inflammatory markers. Recommend to complete course of antibiotics as discussed in clinic visit.

## 2023-02-23 NOTE — Telephone Encounter (Signed)
Patient aware of results and to complete course of antibiotics.   Olivia Jackson, CMA

## 2023-03-18 ENCOUNTER — Other Ambulatory Visit (HOSPITAL_COMMUNITY): Payer: Self-pay

## 2023-03-26 ENCOUNTER — Ambulatory Visit: Payer: Medicare Other | Admitting: Infectious Diseases

## 2023-03-27 ENCOUNTER — Ambulatory Visit: Payer: Medicare Other | Admitting: Infectious Diseases

## 2023-04-21 ENCOUNTER — Encounter: Payer: Self-pay | Admitting: Infectious Diseases

## 2023-04-21 ENCOUNTER — Other Ambulatory Visit: Payer: Self-pay

## 2023-04-21 ENCOUNTER — Ambulatory Visit: Payer: Medicare Other | Admitting: Infectious Diseases

## 2023-04-21 VITALS — BP 97/65 | Wt 114.0 lb

## 2023-04-21 DIAGNOSIS — Z5181 Encounter for therapeutic drug level monitoring: Secondary | ICD-10-CM | POA: Diagnosis not present

## 2023-04-21 DIAGNOSIS — M869 Osteomyelitis, unspecified: Secondary | ICD-10-CM | POA: Diagnosis not present

## 2023-04-21 DIAGNOSIS — I739 Peripheral vascular disease, unspecified: Secondary | ICD-10-CM

## 2023-04-21 NOTE — Progress Notes (Unsigned)
 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   AMOXICILLIN-CLAVULANATE (AUGMENTIN) 875-125 MG TABLET    Take 1 tablet by mouth 2 (two) times daily.   ASPIRIN EC 81 MG TABLET    Take 1 tablet (81 mg total) by mouth daily. Swallow whole.   CLOPIDOGREL (PLAVIX) 75 MG TABLET    Take 1 tablet (75 mg total) by mouth daily.   DOXYCYCLINE (VIBRAMYCIN) 100 MG CAPSULE    Take 1 capsule (100 mg total) by mouth 2 (two) times daily.   METFORMIN (GLUCOPHAGE) 500 MG TABLET    Take 500 mg by mouth daily with supper.   ROSUVASTATIN (CRESTOR) 10 MG TABLET    TAKE 1 TABLET MON WED FRI FOR CHOLESTEROL ORALLY   SILVER SULFADIAZINE (SILVADENE) 1 % CREAM    Apply 1 Application topically daily. Apply once daily to wound and cover with gauze dressing  Modified Medications   No medications on file  Discontinued Medications   No medications on file    Subjective: Discussed the use of AI scribe software for clinical note transcription with the patient, who gave verbal consent to proceed.   83 Y O Female with PMH of colon ca, HTN, falls who is referred from Podiatry for MRI left foot concerning for osteomyelitis of left great toe.   Unable to tell me exactly when the left great toe wound started but says it has been there for several weeks. Initially seen by Podiatry 9/23 with loose lifting left hallux nail for a month which was removed. Prescribed 10 days course of po doxycycline. Xrays per  report Osteolysis of the distal phalanx of the hallux. Seen on 9/30 and continued on doxycycline and cultures taken, planned for MRI. 10/3 prescribed another 10 days course of po augmentin.  She was seen by Vascular 10/8 when reported to have left great toe wound for several weeks and underwent angiogram of with left femoropopliteal angioplasty and stenting  on 10/18. She was then referred to Podiatry 12/9 due to MRI 11/26 findings concerning for osteomyelitis who then referred to [place].  02/19/23 Taking doxycycline, thinks wound was bleeding before but not anymore and it is drying up. Denies any pain, swelling and tenderness. Denies fevers, chills. Denies nausea, vomiting, abdominal pain or diarrhea. Denies cough, chest pain and SOB. Denies GU symptoms or rashes.  Quit smoking and alcohol long time ago.  No new complaints  2/19 Reports she has beentaking doxycycline and augmentin but tells me she takes it when " sees' it. Still has few more pills left. I would have expected her to finish her course if she was taking abtx without missing.  Wound in the left great toe has healed. Denies fevers, chills. Denies pain, tenderness, swelling or drainage in the left great toe. She is very pleased with the progress. She has no other complaints.  Review of Systems: all systems reviewed and negative except stated as above  Past Medical History:  Diagnosis  Date   Anemia    Cancer (HCC) 2016   colon ca   Falls    pt states fell twice this week    History of blood transfusion    12/2014   Hypertension    Past Surgical History:  Procedure Laterality Date   ABDOMINAL AORTOGRAM W/LOWER EXTREMITY N/A 12/19/2022   Procedure: ABDOMINAL AORTOGRAM W/LOWER EXTREMITY;  Surgeon: [name], MD;  [facility];  Service: Cardiovascular;  Laterality: N/A;   colonscopy      DILATION AND CURETTAGE OF UTERUS     LAPAROSCOPIC PARTIAL COLECTOMY N/A 02/15/2015   Procedure: LAPAROSCOPIC RIGHT  COLECTOMY AND SIGMOIDECTOMY PROCTOSCOPY;  Surgeon: Romie Levee, MD;  Location: [facility];  Service: General;  Laterality: N/A;   PERIPHERAL VASCULAR INTERVENTION  12/19/2022   Procedure: PERIPHERAL VASCULAR INTERVENTION;  Surgeon: [name], MD;  Location: [facility];  Service: Cardiovascular;;   UPPER GI ENDOSCOPY     UPPER GI ENDOSCOPY  02/15/2015   Procedure: UPPER GI ENDOSCOPY;  Surgeon: Romie Levee, MD;  Location: [facility];  Service: General;;    Social History   Tobacco Use   Smoking status: Former    Current packs/day: 0.00    Average packs/day: 0.5 packs/day for 20.0 years (10.0 ttl pk-yrs)    Types: Cigarettes    Start date: 03/04/1975    Quit date: 03/04/1995    Years since quitting: 28.1   Smokeless tobacco: Never  Substance Use Topics   Alcohol use: No   Drug use: No    Family History  Problem Relation Age of Onset   Cancer Father 34       brain tumor     No Known Allergies  Health Maintenance  Topic Date Due   COVID-19 Vaccine (1) Never done   Pneumonia Vaccine 79+ Years old (1 of 2 - PCV) Never done   DTaP/Tdap/Td (1 - Tdap) Never done   Zoster Vaccines- Shingrix (1 of 2) Never done   DEXA SCAN  Never done   INFLUENZA VACCINE  10/02/2022   Medicare Annual Wellness (AWV)  05/19/2023   HPV VACCINES  Aged Out    Objective: BP 97/65   Wt 114 lb (51.7 kg)   BMI 20.85 kg/m   Physical Exam Constitutional:      Appearance: Normal appearance.  HENT:     Head: Normocephalic and atraumatic.      Mouth: Mucous membranes are moist.  Eyes:    Conjunctiva/sclera: Conjunctivae normal.     Pupils: Pupils are equal, round, and bilaterally symmetrical   Cardiovascular:     Rate and Rhythm: Normal rate and regular rhythm.     Heart sounds:  Pulmonary:     Effort: Pulmonary effort is normal.     Breath sounds:   Abdominal:     General: Non distended     Palpations:   Musculoskeletal:        General: Normal range of motion. Left great toe  wound at the tip, healed with no signs of active infection, no drainage, tenderness of fluctuance or active cellulitis.    Skin:    General: Skin is warm and dry.     Comments:  Neurological:     General: grossly non focal. Ambulatory    Mental Status: awake, alert and oriented to person, place, and time.   Psychiatric:        Mood and Affect: Mood normal.   Lab Results Lab Results  Component Value  Date   WBC 9.2 02/19/2023   HGB 13.9 02/19/2023   HCT 43.0 02/19/2023   MCV 83.8 02/19/2023   PLT 312 02/19/2023    Lab Results  Component Value Date   CREATININE 0.98 (H) 02/19/2023   BUN 16 02/19/2023   NA 141 02/19/2023   K 4.0 02/19/2023   CL 103 02/19/2023   CO2 23 02/19/2023    Lab Results  Component Value Date   ALT 8 02/19/2023   AST 17 02/19/2023   ALKPHOS 60 10/13/2019   BILITOT 0.8 02/19/2023    No results found for: "CHOL", "HDL", "LDLCALC", "LDLDIRECT", "TRIG", "CHOLHDL" No results found for: "LABRPR", "RPRTITER" No results found for: "HIV1RNAQUANT", "HIV1RNAVL", "CD4TABS"   Imaging No results found.  MRI Left foot 11/26 IMPRESSION: 1. Active osteomyelitis of the proximal and distal phalanges of the great toe. 2. Extensive subcutaneous edema and questionable speckled gas density along the cutaneous and subcutaneous tissues in the right great toe and extending along the medial distal forefoot. 3. Serpentine and lax flexor hallucis longus tendon compatible with distal detachment. 4. Moderate regional muscular atrophy.  Assessment/Plan # Left Great toe osteomyelitis/wound - abtx as above - last On doxycycline since 12/9 - 9/30 wound cx polymicrobial ( Actinotignum schaali, Coagulase negative staph, E faecalis, Finegoldia magna, Peptoniphilus asaccharolyticus, peptoniphilus harei, Peptoniphilus ivorii, Staph lugdunensis - 9/30 CRP 41, ESR 34 - 12/19 visit- continued on doxycyline and added augmentin to comlete 6 weeks course   Plan  - Complete  remaining doses of abtx as prescribed. Emphasized on compliance  - Fu in podiatry as instructed for wound care - Fu as needed with [place]  # Medication Monitoring  - CBC and CMP today  # PAD  - 11/20 ABI some component of arterial occlusive disease  - follows Vascular   I have personally spent 30 minutes involved in face-to-face and non-face-to-face activities for this patient on the day of the visit. Professional time spent includes the following activities: Preparing to see the patient (review of tests), Obtaining and/or reviewing separately obtained history (admission/discharge record), Performing a medically appropriate examination and/or evaluation , Ordering medications/tests/procedures, referring and communicating with other health care professionals, Documenting clinical information in the EMR, Independently interpreting results (not separately reported), Communicating results to the patient/family/caregiver, Counseling and educating the patient/family/caregiver and Care coordination (not separately reported).   Of note, portions of this note may have been created with voice recognition software. While this note has been edited for accuracy, occasional wrong-word or 'sound-a-like' substitutions may have occurred due to the inherent limitations of voice recognition software.   Victoriano Lain, MD [facility] 04/21/2023, 1:47 PM

## 2023-04-22 LAB — COMPREHENSIVE METABOLIC PANEL
AG Ratio: 1.6 (calc) (ref 1.0–2.5)
ALT: 6 U/L (ref 6–29)
AST: 16 U/L (ref 10–35)
Albumin: 4 g/dL (ref 3.6–5.1)
Alkaline phosphatase (APISO): 50 U/L (ref 37–153)
BUN: 8 mg/dL (ref 7–25)
CO2: 27 mmol/L (ref 20–32)
Calcium: 9.6 mg/dL (ref 8.6–10.4)
Chloride: 105 mmol/L (ref 98–110)
Creat: 0.9 mg/dL (ref 0.60–0.95)
Globulin: 2.5 g/dL (ref 1.9–3.7)
Glucose, Bld: 70 mg/dL (ref 65–99)
Potassium: 4.1 mmol/L (ref 3.5–5.3)
Sodium: 142 mmol/L (ref 135–146)
Total Bilirubin: 0.6 mg/dL (ref 0.2–1.2)
Total Protein: 6.5 g/dL (ref 6.1–8.1)

## 2023-04-22 LAB — CBC
HCT: 40.9 % (ref 35.0–45.0)
Hemoglobin: 12.7 g/dL (ref 11.7–15.5)
MCH: 25.9 pg — ABNORMAL LOW (ref 27.0–33.0)
MCHC: 31.1 g/dL — ABNORMAL LOW (ref 32.0–36.0)
MCV: 83.3 fL (ref 80.0–100.0)
MPV: 11.8 fL (ref 7.5–12.5)
Platelets: 355 10*3/uL (ref 140–400)
RBC: 4.91 10*6/uL (ref 3.80–5.10)
RDW: 13.5 % (ref 11.0–15.0)
WBC: 6.1 10*3/uL (ref 3.8–10.8)

## 2023-04-27 NOTE — H&P (View-Only) (Signed)
 [facility] OF [place]  ASSESSMENT / PLAN: Olivia Jackson is a 83 y.o. female status post left femoropopliteal angioplasty and stenting (6x143mm, 6x177mm, 6x66mm Eluvia) 12/19/22  Recommend:  Abstinence from all tobacco products. Blood glucose control with goal A1c < 7%. Blood pressure control with goal blood pressure < 140/90 mmHg. Lipid reduction therapy with goal LDL-C <100 mg/dL  Aspirin 81mg  PO QD.  Clopidogrel 75mg  PO every day. Atorvastatin 40-80mg  PO QD (or other "high intensity" statin therapy).  Her left great toe is healing well. 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 Plan angiogram to evaluate and treat.   CHIEF COMPLAINT: left great toe ulcer  HISTORY OF PRESENT ILLNESS: Olivia Jackson is a 83 y.o. female referred to clinic for evaluation of left great toe ulcer.  The patient reports this has been present for several weeks.  She feels the ulcer has slightly improved with local wound care.  The ulcer is painful to her.  She does not report any antecedent symptoms of peripheral arterial disease.  01/27/23: Returns to clinic. Thinks her toe is getting better. She is grateful.   04/29/23: Returns to clinic for surveillance.  She is doing well overall.  Her left toe is healed.  She has no complaints.  We reviewed her noninvasive testing in detail.  Past Medical History:  Diagnosis Date   Anemia    Cancer (HCC) 2016   colon ca   Falls    pt states fell twice this week    History of blood transfusion    12/2014   Hypertension     Past Surgical History:  Procedure Laterality Date   ABDOMINAL AORTOGRAM W/LOWER EXTREMITY N/A 12/19/2022   Procedure: ABDOMINAL AORTOGRAM W/LOWER EXTREMITY;  Surgeon: [name], MD;  [facility];  Service: Cardiovascular;  Laterality: N/A;   colonscopy      DILATION AND CURETTAGE OF UTERUS     LAPAROSCOPIC PARTIAL COLECTOMY N/A 02/15/2015   Procedure: LAPAROSCOPIC RIGHT COLECTOMY  AND SIGMOIDECTOMY PROCTOSCOPY;  Surgeon: Romie Levee, MD;  Location: [facility];  Service: General;  Laterality: N/A;   PERIPHERAL VASCULAR INTERVENTION  12/19/2022   Procedure: PERIPHERAL VASCULAR INTERVENTION;  Surgeon: [name], MD;  [facility];  Service: Cardiovascular;;   UPPER GI ENDOSCOPY     UPPER GI ENDOSCOPY  02/15/2015   Procedure: UPPER GI ENDOSCOPY;  Surgeon: Romie Levee, MD;  Location: [facility];  Service: General;;    Family History  Problem Relation Age of Onset   Cancer Father 73       brain tumor     Social History   Socioeconomic History   Marital status: Married    Spouse name: Not on file   Number of children: Not on file   Years of education: Not on file   Highest education level: Not on file  Occupational History   Not on file  Tobacco Use   Smoking status: Former    Current packs/day: 0.00    Average packs/day: 0.5 packs/day for 20.0 years (10.0 ttl pk-yrs)    Types: Cigarettes    Start date: 03/04/1975    Quit date: 03/04/1995    Years since quitting: 28.1   Smokeless tobacco: Never  Substance and Sexual Activity   Alcohol use: No   Drug use: No   Sexual activity: Not on file  Other Topics Concern   Not on file  Social History Narrative   Married, husband Mathis Fare   No children  Independent ADLs, drives   Social Drivers of Corporate investment banker Strain: Not on file  Food Insecurity: Not on file  Transportation Needs: Not on file  Physical Activity: Not on file  Stress: Not on file  Social Connections: Not on file  Intimate Partner Violence: Not on file    No Known Allergies  Current Outpatient Medications  Medication Sig Dispense Refill   acetaminophen (TYLENOL) 325 MG tablet Take 2 tablets (650 mg total) by mouth every 6 (six) hours as needed.     amoxicillin-clavulanate (AUGMENTIN) 875-125 MG tablet Take 1 tablet by mouth 2 (two) times daily. 42 tablet 0   aspirin EC 81 MG tablet Take 1 tablet (81 mg total)  by mouth daily. Swallow whole. 150 tablet 2   clopidogrel (PLAVIX) 75 MG tablet Take 1 tablet (75 mg total) by mouth daily. 30 tablet 11   doxycycline (VIBRAMYCIN) 100 MG capsule Take 1 capsule (100 mg total) by mouth 2 (two) times daily. 32 capsule 0   metFORMIN (GLUCOPHAGE) 500 MG tablet Take 500 mg by mouth daily with supper.     rosuvastatin (CRESTOR) 20 MG tablet Take 20 mg by mouth 3 (three) times a week.     silver sulfADIAZINE (SILVADENE) 1 % cream Apply 1 Application topically daily. Apply once daily to wound and cover with gauze dressing 50 g 0   No current facility-administered medications for this visit.    PHYSICAL EXAM Vitals:   04/28/23 1511  BP: 135/68  Pulse: 95  Temp: 97.9 F (36.6 C)  SpO2: 97%  Weight: 108 lb 12.8 oz (49.4 kg)  Height: 5\' 2"  (1.575 m)      Elderly woman in no distress Regular rate and rhythm Unlabored breathing Palpable femoral pulses bilaterally No palpable popliteal pulses bilaterally No palpable pedal pulses bilaterally Left great toe ischemic ulceration resolved.  PERTINENT LABORATORY AND RADIOLOGIC DATA  Most recent CBC    Latest Ref Rng & Units 04/21/2023    2:00 PM 02/19/2023    1:14 PM 12/19/2022   11:53 AM  CBC  WBC 3.8 - 10.8 Thousand/uL 6.1  9.2    Hemoglobin 11.7 - 15.5 g/dL 52.8  41.3  24.4   Hematocrit 35.0 - 45.0 % 40.9  43.0  38.0   Platelets 140 - 400 Thousand/uL 355  312       Most recent CMP    Latest Ref Rng & Units 04/21/2023    2:00 PM 02/19/2023    1:14 PM 12/19/2022   11:53 AM  CMP  Glucose 65 - 99 mg/dL 70  75  56   BUN 7 - 25 mg/dL 8  16  9    Creatinine 0.60 - 0.95 mg/dL 0.10  2.72  5.36   Sodium 135 - 146 mmol/L 142  141  139   Potassium 3.5 - 5.3 mmol/L 4.1  4.0  4.0   Chloride 98 - 110 mmol/L 105  103  108   CO2 20 - 32 mmol/L 27  23    Calcium 8.6 - 10.4 mg/dL 9.6  64.4    Total Protein 6.1 - 8.1 g/dL 6.5  7.3    Total Bilirubin 0.2 - 1.2 mg/dL 0.6  0.8    AST 10 - 35 U/L 16  17    ALT 6  - 29 U/L 6  8     +-------+-----------+-----------+------------+------------+  ABI/TBIToday's ABIToday's TBIPrevious ABIPrevious TBI  +-------+-----------+-----------+------------+------------+  Right 0.53       0.47  0.54        0.51          +-------+-----------+-----------+------------+------------+  Left  0.93       0.49       0.98        n/a           +-------+-----------+-----------+------------+------------+   Left lower extremity duplex: 50-74% stenosis noted in the common femoral artery. 50-99% stenosis noted proximal to stent and the proximal stent.   Rande Brunt. Lenell Antu, MD [facility] Phone Number: [phone] 04/29/2023 1:33 PM   Total time spent on preparing this encounter including chart review, data review, collecting history, examining the patient, coordinating care for this established patient, 40 minutes.  Portions of this report may have been transcribed using voice recognition software.  Every effort has been made to ensure accuracy; however, inadvertent computerized transcription errors may still be present.

## 2023-04-27 NOTE — Progress Notes (Unsigned)
 [facility] OF [place]  ASSESSMENT / PLAN: Olivia Jackson is a 83 y.o. female status post left femoropopliteal angioplasty and stenting (6x134mm, 6x165mm, 6x7mm Eluvia) 12/19/22  Recommend:  Abstinence from all tobacco products. Blood glucose control with goal A1c < 7%. Blood pressure control with goal blood pressure < 140/90 mmHg. Lipid reduction therapy with goal LDL-C <100 mg/dL  Aspirin 81mg  PO QD.  Clopidogrel 75mg  PO every day. Atorvastatin 40-80mg  PO QD (or other "high intensity" statin therapy).  Her left great toe is healing well. 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 Plan angiogram to evaluate and treat.   CHIEF COMPLAINT: left great toe ulcer  HISTORY OF PRESENT ILLNESS: Olivia Jackson is a 83 y.o. female referred to clinic for evaluation of left great toe ulcer.  The patient reports this has been present for several weeks.  She feels the ulcer has slightly improved with local wound care.  The ulcer is painful to her.  She does not report any antecedent symptoms of peripheral arterial disease.  01/27/23: Returns to clinic. Thinks her toe is getting better. She is grateful.   04/29/23: Returns to clinic for surveillance.  She is doing well overall.  Her left toe is healed.  She has no complaints.  We reviewed her noninvasive testing in detail.  Past Medical History:  Diagnosis Date   Anemia    Cancer (HCC) 2016   colon ca   Falls    pt states fell twice this week    History of blood transfusion    12/2014   Hypertension     Past Surgical History:  Procedure Laterality Date   ABDOMINAL AORTOGRAM W/LOWER EXTREMITY N/A 12/19/2022   Procedure: ABDOMINAL AORTOGRAM W/LOWER EXTREMITY;  Surgeon: [name], MD;  [facility];  Service: Cardiovascular;  Laterality: N/A;   colonscopy      DILATION AND CURETTAGE OF UTERUS     LAPAROSCOPIC PARTIAL COLECTOMY N/A 02/15/2015   Procedure: LAPAROSCOPIC RIGHT COLECTOMY  AND SIGMOIDECTOMY PROCTOSCOPY;  Surgeon: Romie Levee, MD;  Location: [facility];  Service: General;  Laterality: N/A;   PERIPHERAL VASCULAR INTERVENTION  12/19/2022   Procedure: PERIPHERAL VASCULAR INTERVENTION;  Surgeon: [name], MD;  [facility];  Service: Cardiovascular;;   UPPER GI ENDOSCOPY     UPPER GI ENDOSCOPY  02/15/2015   Procedure: UPPER GI ENDOSCOPY;  Surgeon: Romie Levee, MD;  Location: [facility];  Service: General;;    Family History  Problem Relation Age of Onset   Cancer Father 73       brain tumor     Social History   Socioeconomic History   Marital status: Married    Spouse name: Not on file   Number of children: Not on file   Years of education: Not on file   Highest education level: Not on file  Occupational History   Not on file  Tobacco Use   Smoking status: Former    Current packs/day: 0.00    Average packs/day: 0.5 packs/day for 20.0 years (10.0 ttl pk-yrs)    Types: Cigarettes    Start date: 03/04/1975    Quit date: 03/04/1995    Years since quitting: 28.1   Smokeless tobacco: Never  Substance and Sexual Activity   Alcohol use: No   Drug use: No   Sexual activity: Not on file  Other Topics Concern   Not on file  Social History Narrative   Married, husband Mathis Fare   No children  Independent ADLs, drives   Social Drivers of Corporate investment banker Strain: Not on file  Food Insecurity: Not on file  Transportation Needs: Not on file  Physical Activity: Not on file  Stress: Not on file  Social Connections: Not on file  Intimate Partner Violence: Not on file    No Known Allergies  Current Outpatient Medications  Medication Sig Dispense Refill   acetaminophen (TYLENOL) 325 MG tablet Take 2 tablets (650 mg total) by mouth every 6 (six) hours as needed.     amoxicillin-clavulanate (AUGMENTIN) 875-125 MG tablet Take 1 tablet by mouth 2 (two) times daily. 42 tablet 0   aspirin EC 81 MG tablet Take 1 tablet (81 mg total)  by mouth daily. Swallow whole. 150 tablet 2   clopidogrel (PLAVIX) 75 MG tablet Take 1 tablet (75 mg total) by mouth daily. 30 tablet 11   doxycycline (VIBRAMYCIN) 100 MG capsule Take 1 capsule (100 mg total) by mouth 2 (two) times daily. 32 capsule 0   metFORMIN (GLUCOPHAGE) 500 MG tablet Take 500 mg by mouth daily with supper.     rosuvastatin (CRESTOR) 20 MG tablet Take 20 mg by mouth 3 (three) times a week.     silver sulfADIAZINE (SILVADENE) 1 % cream Apply 1 Application topically daily. Apply once daily to wound and cover with gauze dressing 50 g 0   No current facility-administered medications for this visit.    PHYSICAL EXAM Vitals:   04/28/23 1511  BP: 135/68  Pulse: 95  Temp: 97.9 F (36.6 C)  SpO2: 97%  Weight: 108 lb 12.8 oz (49.4 kg)  Height: 5\' 2"  (1.575 m)      Elderly woman in no distress Regular rate and rhythm Unlabored breathing Palpable femoral pulses bilaterally No palpable popliteal pulses bilaterally No palpable pedal pulses bilaterally Left great toe ischemic ulceration resolved.  PERTINENT LABORATORY AND RADIOLOGIC DATA  Most recent CBC    Latest Ref Rng & Units 04/21/2023    2:00 PM 02/19/2023    1:14 PM 12/19/2022   11:53 AM  CBC  WBC 3.8 - 10.8 Thousand/uL 6.1  9.2    Hemoglobin 11.7 - 15.5 g/dL 69.6  29.5  28.4   Hematocrit 35.0 - 45.0 % 40.9  43.0  38.0   Platelets 140 - 400 Thousand/uL 355  312       Most recent CMP    Latest Ref Rng & Units 04/21/2023    2:00 PM 02/19/2023    1:14 PM 12/19/2022   11:53 AM  CMP  Glucose 65 - 99 mg/dL 70  75  56   BUN 7 - 25 mg/dL 8  16  9    Creatinine 0.60 - 0.95 mg/dL 1.32  4.40  1.02   Sodium 135 - 146 mmol/L 142  141  139   Potassium 3.5 - 5.3 mmol/L 4.1  4.0  4.0   Chloride 98 - 110 mmol/L 105  103  108   CO2 20 - 32 mmol/L 27  23    Calcium 8.6 - 10.4 mg/dL 9.6  72.5    Total Protein 6.1 - 8.1 g/dL 6.5  7.3    Total Bilirubin 0.2 - 1.2 mg/dL 0.6  0.8    AST 10 - 35 U/L 16  17    ALT 6  - 29 U/L 6  8     +-------+-----------+-----------+------------+------------+  ABI/TBIToday's ABIToday's TBIPrevious ABIPrevious TBI  +-------+-----------+-----------+------------+------------+  Right 0.53       0.47  0.54        0.51          +-------+-----------+-----------+------------+------------+  Left  0.93       0.49       0.98        n/a           +-------+-----------+-----------+------------+------------+   Left lower extremity duplex: 50-74% stenosis noted in the common femoral artery. 50-99% stenosis noted proximal to stent and the proximal stent.   Rande Brunt. Lenell Antu, MD [facility] Phone Number: [phone] 04/29/2023 1:33 PM   Total time spent on preparing this encounter including chart review, data review, collecting history, examining the patient, coordinating care for this established patient, 40 minutes.  Portions of this report may have been transcribed using voice recognition software.  Every effort has been made to ensure accuracy; however, inadvertent computerized transcription errors may still be present.

## 2023-04-28 ENCOUNTER — Ambulatory Visit (HOSPITAL_COMMUNITY)
Admission: RE | Admit: 2023-04-28 | Discharge: 2023-04-28 | Disposition: A | Discharge status: Home / Self Care | Payer: Medicare Other | Source: Ambulatory Visit | Attending: Vascular Surgery | Admitting: Vascular Surgery

## 2023-04-28 ENCOUNTER — Ambulatory Visit (INDEPENDENT_AMBULATORY_CARE_PROVIDER_SITE_OTHER)
Admission: RE | Admit: 2023-04-28 | Discharge: 2023-04-28 | Disposition: A | Discharge status: Home / Self Care | Payer: Medicare Other | Source: Ambulatory Visit | Attending: Vascular Surgery | Admitting: Vascular Surgery

## 2023-04-28 ENCOUNTER — Encounter: Payer: Self-pay | Admitting: Vascular Surgery

## 2023-04-28 ENCOUNTER — Ambulatory Visit: Payer: Medicare Other | Admitting: Vascular Surgery

## 2023-04-28 VITALS — BP 135/68 | HR 95 | Temp 97.9°F | Ht 62.0 in | Wt 108.8 lb

## 2023-04-28 DIAGNOSIS — I739 Peripheral vascular disease, unspecified: Secondary | ICD-10-CM | POA: Diagnosis not present

## 2023-04-28 DIAGNOSIS — I7025 Atherosclerosis of native arteries of other extremities with ulceration: Secondary | ICD-10-CM

## 2023-04-28 LAB — VAS US ABI WITH/WO TBI
Left ABI: 0.93
Right ABI: 0.53

## 2023-05-01 ENCOUNTER — Other Ambulatory Visit: Payer: Self-pay

## 2023-05-01 DIAGNOSIS — I7025 Atherosclerosis of native arteries of other extremities with ulceration: Secondary | ICD-10-CM

## 2023-05-15 ENCOUNTER — Ambulatory Visit (HOSPITAL_COMMUNITY)
Admission: RE | Admit: 2023-05-15 | Discharge: 2023-05-15 | Disposition: A | Discharge status: Home / Self Care | Payer: Medicare Other | Attending: Vascular Surgery | Admitting: Vascular Surgery

## 2023-05-15 ENCOUNTER — Other Ambulatory Visit: Payer: Self-pay

## 2023-05-15 ENCOUNTER — Encounter (HOSPITAL_COMMUNITY)
Admission: RE | Disposition: A | Discharge status: Home / Self Care | Payer: Self-pay | Source: Home / Self Care | Attending: Vascular Surgery

## 2023-05-15 DIAGNOSIS — Z87891 Personal history of nicotine dependence: Secondary | ICD-10-CM | POA: Diagnosis not present

## 2023-05-15 DIAGNOSIS — Z79899 Other long term (current) drug therapy: Secondary | ICD-10-CM | POA: Diagnosis not present

## 2023-05-15 DIAGNOSIS — Z7982 Long term (current) use of aspirin: Secondary | ICD-10-CM | POA: Diagnosis not present

## 2023-05-15 DIAGNOSIS — Z95820 Peripheral vascular angioplasty status with implants and grafts: Secondary | ICD-10-CM | POA: Insufficient documentation

## 2023-05-15 DIAGNOSIS — I70212 Atherosclerosis of native arteries of extremities with intermittent claudication, left leg: Secondary | ICD-10-CM | POA: Insufficient documentation

## 2023-05-15 DIAGNOSIS — Z7902 Long term (current) use of antithrombotics/antiplatelets: Secondary | ICD-10-CM | POA: Diagnosis not present

## 2023-05-15 DIAGNOSIS — I70202 Unspecified atherosclerosis of native arteries of extremities, left leg: Secondary | ICD-10-CM | POA: Diagnosis not present

## 2023-05-15 DIAGNOSIS — I7025 Atherosclerosis of native arteries of other extremities with ulceration: Secondary | ICD-10-CM

## 2023-05-15 HISTORY — PX: ABDOMINAL AORTOGRAM W/LOWER EXTREMITY: CATH118223

## 2023-05-15 LAB — POCT I-STAT, CHEM 8
BUN: 9 mg/dL (ref 8–23)
Calcium, Ion: 1.19 mmol/L (ref 1.15–1.40)
Chloride: 104 mmol/L (ref 98–111)
Creatinine, Ser: 0.9 mg/dL (ref 0.44–1.00)
Glucose, Bld: 66 mg/dL — ABNORMAL LOW (ref 70–99)
HCT: 41 % (ref 36.0–46.0)
Hemoglobin: 13.9 g/dL (ref 12.0–15.0)
Potassium: 3.4 mmol/L — ABNORMAL LOW (ref 3.5–5.1)
Sodium: 139 mmol/L (ref 135–145)
TCO2: 26 mmol/L (ref 22–32)

## 2023-05-15 LAB — GLUCOSE, CAPILLARY
Glucose-Capillary: 174 mg/dL — ABNORMAL HIGH (ref 70–99)
Glucose-Capillary: 143 mg/dL — ABNORMAL HIGH (ref 70–99)

## 2023-05-15 SURGERY — ABDOMINAL AORTOGRAM W/LOWER EXTREMITY
Anesthesia: LOCAL

## 2023-05-15 MED ORDER — SODIUM CHLORIDE 0.9 % IV SOLN: INTRAVENOUS | Status: DC

## 2023-05-15 MED ORDER — SODIUM CHLORIDE 0.9 % WEIGHT BASED INFUSION: 1.0000 mL/kg/h | INTRAVENOUS | Status: DC

## 2023-05-15 MED ORDER — MIDAZOLAM HCL 2 MG/2ML IJ SOLN
INTRAMUSCULAR | Status: AC
  Filled 2023-05-15: qty 2

## 2023-05-15 MED ORDER — FENTANYL CITRATE (PF) 100 MCG/2ML IJ SOLN
INTRAMUSCULAR | Status: DC | PRN
  Administered 2023-05-15: 50 ug via INTRAVENOUS

## 2023-05-15 MED ORDER — SODIUM CHLORIDE 0.9% FLUSH: 3.0000 mL | INTRAVENOUS | Status: DC | PRN

## 2023-05-15 MED ORDER — LABETALOL HCL 5 MG/ML IV SOLN: 10.0000 mg | INTRAVENOUS | Status: DC | PRN

## 2023-05-15 MED ORDER — DEXTROSE 50 % IV SOLN
INTRAVENOUS | Status: AC
Start: 2023-05-15 — End: 2023-05-15
  Administered 2023-05-15: 50 mL
  Filled 2023-05-15: qty 50

## 2023-05-15 MED ORDER — HEPARIN (PORCINE) IN NACL 1000-0.9 UT/500ML-% IV SOLN
INTRAVENOUS | Status: DC | PRN
  Administered 2023-05-15: 1000 mL

## 2023-05-15 MED ORDER — LIDOCAINE HCL (PF) 1 % IJ SOLN
INTRAMUSCULAR | Status: AC
  Filled 2023-05-15: qty 30

## 2023-05-15 MED ORDER — SODIUM CHLORIDE 0.9% FLUSH: 3.0000 mL | Freq: Two times a day (BID) | INTRAVENOUS | Status: DC

## 2023-05-15 MED ORDER — SODIUM CHLORIDE 0.9 % IV SOLN
INTRAVENOUS | Status: DC | PRN
  Administered 2023-05-15: 100 mL/h via INTRAVENOUS

## 2023-05-15 MED ORDER — FENTANYL CITRATE (PF) 100 MCG/2ML IJ SOLN
INTRAMUSCULAR | Status: AC
  Filled 2023-05-15: qty 2

## 2023-05-15 MED ORDER — ACETAMINOPHEN 325 MG PO TABS: 650.0000 mg | ORAL_TABLET | ORAL | Status: DC | PRN

## 2023-05-15 MED ORDER — HYDRALAZINE HCL 20 MG/ML IJ SOLN: 5.0000 mg | INTRAMUSCULAR | Status: DC | PRN

## 2023-05-15 MED ORDER — LIDOCAINE HCL (PF) 1 % IJ SOLN
INTRAMUSCULAR | Status: DC | PRN
  Administered 2023-05-15: 10 mL

## 2023-05-15 MED ORDER — ONDANSETRON HCL 4 MG/2ML IJ SOLN: 4.0000 mg | Freq: Four times a day (QID) | INTRAMUSCULAR | Status: DC | PRN

## 2023-05-15 MED ORDER — SODIUM CHLORIDE 0.9 % IV SOLN: 250.0000 mL | INTRAVENOUS | Status: DC | PRN

## 2023-05-15 MED ORDER — MIDAZOLAM HCL 2 MG/2ML IJ SOLN
INTRAMUSCULAR | Status: DC | PRN
  Administered 2023-05-15: 1 mg via INTRAVENOUS

## 2023-05-15 SURGICAL SUPPLY — 8 items
CATH OMNI FLUSH 5F 65CM (CATHETERS) IMPLANT
KIT MICROPUNCTURE NIT STIFF (SHEATH) IMPLANT
KIT SINGLE USE MANIFOLD (KITS) IMPLANT
PACK CARDIAC CATHETERIZATION (CUSTOM PROCEDURE TRAY) IMPLANT
SET ATX-X65L (MISCELLANEOUS) IMPLANT
SHEATH PINNACLE 5F 10CM (SHEATH) IMPLANT
SHEATH PROBE COVER 6X72 (BAG) IMPLANT
WIRE BENTSON .035X145CM (WIRE) IMPLANT

## 2023-05-15 NOTE — Interval H&P Note (Signed)
 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  The various methods of treatment have been discussed with the patient and family. After consideration of risks, benefits and other options for treatment, the patient has consented to  Procedure(s): ABDOMINAL AORTOGRAM W/LOWER EXTREMITY (N/A) as a surgical intervention.  The patient's history has been reviewed, patient examined, no change in status, stable for surgery.  I have reviewed the patient's chart and labs.  Questions were answered to the patient's satisfaction.     [name]

## 2023-05-15 NOTE — Progress Notes (Signed)
 Drank 2nd 4 oz OJ. Recheck CBG soon

## 2023-05-15 NOTE — Op Note (Signed)
 DATE OF SERVICE: 05/15/2023  PATIENT:  Olivia Jackson  83 y.o. female  PRE-OPERATIVE DIAGNOSIS:  Atherosclerosis of native arteries of left lower extremity status post femoropopliteal stenting with possible in-stent restenosis  POST-OPERATIVE DIAGNOSIS:  same; mild common femoral stenosis; no in-stent stenosis  PROCEDURE:   1) Ultrasound guided right common femoral access 2) Aortogram 3) Left lower extremity angiogram with second order cannulation (40mL total contrast) 4) Conscious sedation (15 minutes)   SURGEON:  Rande Brunt. Lenell Antu, MD  ASSISTANT: none  ANESTHESIA:   local and IV sedation  ESTIMATED BLOOD LOSS: minimal  LOCAL MEDICATIONS USED:  LIDOCAINE   COUNTS: confirmed correct.  PATIENT DISPOSITION:  PACU - hemodynamically stable.   Delay start of Pharmacological VTE agent (>24hrs) due to surgical blood loss or risk of bleeding: no  INDICATION FOR PROCEDURE: Olivia Jackson is a 83 y.o. 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  Duplex is been performed for surveillance and shows progressive proximal stenting in-stent stenosis. After careful discussion of risks, benefits, and alternatives the patient was offered angiography. The patient  understood and wished to proceed.  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  This is likely the lesion identified on duplex.  It appears like much less severe than duplex suggested. Right common femoral artery: Not studied  Left profunda femoris artery: Patent  Right profunda femoris artery: Not studied  Left superficial femoral artery: Stenting is widely patent.  The question of in-stent stenosis was likely proximal to the stenting in the common femoral artery where there is distal disease as above Right superficial femoral artery: Not studied  Left popliteal artery: Widely patent stenting Right popliteal artery: Not studied  Left anterior tibial artery: Patent to the foot where the dorsalis pedis abruptly occludes and collaterals fill the foot Right anterior tibial artery: Not studied  Left tibioperoneal trunk: Diminutive but patent Right tibioperoneal trunk: Not studied  Left peroneal artery: Patent at its origin.  Tapers to occlusion in the mid calf Right peroneal artery: Not studied  Left posterior tibial artery: Patent at its origin tapers to occlusion at the mid calf Right posterior tibial artery: Not studied  Left pedal circulation: This advantage.  The DP appropriately occludes as above.  Collaterals fill the foot from the anterior tibial artery. Right pedal circulation: Not studied   GLASS score.  Not applicable.  No CLTI  WIfI score.  Not applicable.  No CLTI  DESCRIPTION OF PROCEDURE: After identification of the patient in the pre-operative holding area, the patient was transferred to the operating room. The patient was positioned supine on the operating room table. Anesthesia was induced. The groins was prepped and draped in standard fashion. A surgical pause was performed confirming correct patient, procedure, and operative location.  The right groin was anesthetized with subcutaneous injection of 1% lidocaine. Using ultrasound guidance, the right common femoral artery was accessed with micropuncture technique. Fluoroscopy was used to confirm cannulation over the femoral head. A sheath injection was performed. The 37F micropuncture sheath was upsized to 45F.   A Benson wire was advanced into  the distal aorta. Over the wire an omni flush  catheter was advanced to the level of L2. Aortogram was performed - see above for details.   The left common iliac artery was selected with an omniflush catheter and benson guidewire. The wire was advanced into the common femoral artery. Over the wire the omni flush catheter was advanced into the external iliac artery. Selective angiography was performed - see above for details.   The sheath was left in place to be removed in the recovery area.  Conscious sedation was administered with the use of IV fentanyl and midazolam under continuous physician and nurse monitoring.  Heart rate, blood pressure, and oxygen saturation were continuously monitored.  Total sedation time was 15 minutes  Upon completion of the case instrument and sharps counts were confirmed correct. The patient was transferred to the PACU in good condition. I was present for all portions of the procedure.  PLAN: ASA 81mg  PO every day. Plavix 75mg  PO every day. High intensity statin therapy. Follow up 6 months with ABI / LLE duplex.  Rande Brunt. Lenell Antu, MD [facility] Phone Number: [phone] 05/15/2023 10:24 AM

## 2023-05-15 NOTE — Progress Notes (Addendum)
 CBG 35 at 1240; 1 amp D50 IV given; drank 4 oz OJ. Dr. Lenell Antu notified. Patient asymptomatic

## 2023-05-15 NOTE — Progress Notes (Signed)
 Site area: rt groin 59F arterial sheath Site Prior to Removal:  Level 0 Pressure Applied For: 25 minutes Manual:   yes Patient Status During Pull:  stable Post Pull Site:  Level 0 Post Pull Instructions Given:  yes Post Pull Pulses Present: rt dp pulse dopplered Dressing Applied:  gauze and tegaderm Bedrest begins @ 1225 Comments:

## 2023-05-15 NOTE — Discharge Instructions (Addendum)
 Femoral Site Care This sheet gives you information about how to care for yourself after your procedure. Your health care provider may also give you more specific instructions. If you have problems or questions, contact your health care provider. What can I expect after the procedure?  After the procedure, it is common to have: Bruising that usually fades within 1-2 weeks. Tenderness at the site. Follow these instructions at home: Wound care Follow instructions from your health care provider about how to take care of your insertion site. Make sure you: Wash your hands with soap and water before you change your bandage (dressing). If soap and water are not available, use hand sanitizer. Remove your dressing as told by your health care provider. In 24 hours Do not take baths, swim, or use a hot tub until your health care provider approves. You may shower 24 hours after the procedure or as told by your health care provider. Gently wash the site with plain soap and water. Pat the area dry with a clean towel. Do not rub the site. This may cause bleeding. Do not apply powder or lotion to the site. Keep the site clean and dry. Check your femoral site every day for signs of infection. Check for: Redness, swelling, or pain. Fluid or blood. Warmth. Pus or a bad smell. Activity For the first 3 days after your procedure, or as long as directed: Avoid climbing stairs as much as possible. Do not squat. Do not lift anything that is heavier than 10 lb (4.5 kg) For 5 days Rest as directed. Avoid sitting for a long time without moving. Get up to take short walks every 1-2 hours. Do not drive for 24 hours if you were given a medicine to help you relax (sedative). General instructions Take over-the-counter and prescription medicines only as told by your health care provider. Keep all follow-up visits as told by your health care provider. This is important. Contact a health care provider if you have: A  fever or chills. You have redness, swelling, or pain around your insertion site. Get help right away if: The catheter insertion area swells very fast. You pass out. You suddenly start to sweat or your skin gets clammy. The catheter insertion area is bleeding, and the bleeding does not stop when you hold steady pressure on the area. The area near or just beyond the catheter insertion site becomes pale, cool, tingly, or numb. These symptoms may represent a serious problem that is an emergency. Do not wait to see if the symptoms will go away. Get medical help right away. Call your local emergency services (911 in the [place] Do not drive yourself to the hospital. Summary After the procedure, it is common to have bruising that usually fades within 1-2 weeks. Check your femoral site every day for signs of infection. Do not lift anything that is heavier than 10 lb (4.5 kg), or the limit that you are told, until your health care provider says that it is safe. This information is not intended to replace advice given to you by your health care provider. Make sure you discuss any questions you have with your health care provider. Document Revised: 03/02/2017 Document Reviewed: 03/02/2017 Elsevier Patient Education  2020 [employer].

## 2023-05-18 ENCOUNTER — Encounter (HOSPITAL_COMMUNITY): Payer: Self-pay | Admitting: Vascular Surgery

## 2023-05-18 LAB — GLUCOSE, CAPILLARY: Glucose-Capillary: 35 mg/dL — CL (ref 70–99)

## 2023-08-03 DIAGNOSIS — R928 Other abnormal and inconclusive findings on diagnostic imaging of breast: Secondary | ICD-10-CM | POA: Diagnosis not present

## 2023-08-07 DIAGNOSIS — I739 Peripheral vascular disease, unspecified: Secondary | ICD-10-CM | POA: Diagnosis not present

## 2023-08-07 DIAGNOSIS — Z0000 Encounter for general adult medical examination without abnormal findings: Secondary | ICD-10-CM | POA: Diagnosis not present

## 2023-08-07 DIAGNOSIS — R413 Other amnesia: Secondary | ICD-10-CM | POA: Diagnosis not present

## 2023-08-07 DIAGNOSIS — D509 Iron deficiency anemia, unspecified: Secondary | ICD-10-CM | POA: Diagnosis not present

## 2023-08-07 DIAGNOSIS — Z23 Encounter for immunization: Secondary | ICD-10-CM | POA: Diagnosis not present

## 2023-08-07 DIAGNOSIS — E7800 Pure hypercholesterolemia, unspecified: Secondary | ICD-10-CM | POA: Diagnosis not present

## 2023-08-07 DIAGNOSIS — E1169 Type 2 diabetes mellitus with other specified complication: Secondary | ICD-10-CM | POA: Diagnosis not present

## 2023-08-21 DIAGNOSIS — R413 Other amnesia: Secondary | ICD-10-CM | POA: Diagnosis not present

## 2023-09-01 ENCOUNTER — Encounter: Payer: Self-pay | Admitting: Physician Assistant

## 2023-12-19 NOTE — Progress Notes (Signed)
 Assessment/Plan:     Olivia Jackson is a very pleasant 83 y.o. year old RH female with a history of hypertension, hyperlipidemia, Iron deficiency anemia, PVD, DM2, seen today for evaluation of memory loss. MoCA today is 14/30. 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 Etiology is unclear, workup is in progress.  Patient is able to participate on ADLs. Discussed starting donepezil 10 mg daily if tolerated, patient agrees to proceed.   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 Side effects discussed   Recommend good control of cardiovascular risk factors.   Continue to control mood as per PCP [place] up in 3 months   Subjective:    The patient is accompanied by her sister who supplements  the history.    How long did patient have memory difficulties? She denies any issues. For about 1 year little things.  Patient reports some difficulty remembering new information, recent conversations, names. LTM is fair.. She loves reading. repeats oneself?  Endorsed by her sister Disoriented when walking into a room? Denies    Leaving objects in unusual places?  Denies. She may unplug the phone and think that the line is cut off.    Wandering behavior? Denies.   Any personality changes, or depression, anxiety? Once in a while I feel down Hallucinations or paranoia? Denies.   Seizures? Denies.    Any sleep changes?  Sleeps well wonderful Denies frequent nightmares or dream reenactment, other REM behavior or sleepwalking   Sleep apnea? Denies.   Any hygiene concerns?  Denies.   Independent of bathing and dressing? Endorsed  Does the patient need help with medications?   Patient  is in charge   Who is in charge of the finances? Brother is in charge     Any changes in appetite? Trying to watch her diet, eats healthier.     Patient have trouble swallowing?  Denies.   Does the patient cook?Yes, denies forgetting common recipes or kitchen accidents   Any headaches?  Denies.   Chronic pain? Denies.   Ambulates with difficulty? She has chronic LLE lymphedema which may limit her mobility.    Recent falls or head injuries? Denies.     Vision changes?  Denies any new issues.  Any strokelike symptoms? Denies.   Any tremors? Denies.   Any anosmia? Denies.   Any incontinence of urine? Denies.   Any bowel dysfunction? Denies.      Patient lives alone History of heavy alcohol intake? Denies.   History of heavy tobacco use? Denies.   Family history of dementia? Denies  Does patient drive? No longer drives  High School.  Retired Advertising copywriter   Pertinent labs: TC 230, LDL 116.     No Known Allergies  Current Outpatient Medications  Medication Instructions   acetaminophen  (TYLENOL ) 650 mg, Oral, Every 6 hours PRN   donepezil (ARICEPT) 10 MG tablet Take half tablet (5 mg) daily for 2 weeks, then increase to the full tablet at 10 mg daily   levofloxacin  (LEVAQUIN ) 750 mg, Oral, Daily   metFORMIN (GLUCOPHAGE) 500 mg, Daily with supper   rosuvastatin (CRESTOR) 20 mg, Oral, 3 times weekly   silver  sulfADIAZINE  (SILVADENE ) 1 %  cream 1 Application, Topical, Daily, Apply once daily to wound and cover with gauze dressing     VITALS:   Vitals:   12/22/23 1311  BP: (!) 89/59  Pulse: 63  Resp: 18  SpO2: 98%  Weight: 136 lb (61.7 kg)  Height: 5' 2 (1.575 m)     Physical Exam  :    12/22/2023    1:00 PM  Montreal Cognitive Assessment   Visuospatial/ Executive (0/5) 0  Naming (0/3) 2  Attention: Read list of digits (0/2) 2  Attention: Read list of letters (0/1) 1  Attention: Serial 7 subtraction starting at 100 (0/3) 0  Language: Repeat phrase (0/2) 1   Language : Fluency (0/1) 1  Abstraction (0/2) 0  Delayed Recall (0/5) 0  Orientation (0/6) 6  Total 13  Adjusted Score (based on education) 14        No data to display             HEENT:  Normocephalic, atraumatic.  The superficial temporal arteries are without ropiness or tenderness. Cardiovascular: Regular rate and rhythm. Lungs: Clear to auscultation bilaterally. Neck: There are no carotid bruits noted bilaterally. Orientation:  Alert and oriented to person, place and to time . No aphasia or dysarthria. Fund of knowledge is reduced. Recent and remote memory impaired.  Attention and concentration are reduced .  Able to name objects 2/3 and unable to repeat phrases.  Delayed recall  0/5 .  Cranial nerves: There is good facial symmetry. Extraocular muscles are intact and visual fields are full to confrontational testing. Speech is fluent and clear. No tongue deviation. Hearing is intact to conversational tone.  Tone: Tone is good throughout. Sensation: Sensation is intact to light touch.  Vibration is intact at the bilateral big toe.  Coordination: The patient has no difficulty with RAM's or FNF bilaterally. Normal finger to nose  Motor: Strength is 5/5 in the bilateral upper and lower extremities. There is no pronator drift. There are no fasciculations noted. DTR's: Deep tendon reflexes are 2/4 bilaterally. Gait and Station: The patient is able to ambulate without difficulty. Gait is cautious and narrow. Stride length is normal.        Thank you for allowing us  the opportunity to participate in the care of this nice patient. Please do not hesitate to contact us  for any questions or concerns.   Total time spent on today's visit was 48 minutes dedicated to this patient today, preparing to see patient, examining the patient, ordering tests and/or medications and counseling the patient, documenting clinical information in the EHR or other health record, independently interpreting  results and communicating results to the patient/family, discussing treatment and goals, answering patient's questions and coordinating care.  Cc:  Leonel Cole, MD  Camie Sevin 12/22/2023 2:22 PM

## 2023-12-22 ENCOUNTER — Encounter: Payer: Self-pay | Admitting: Physician Assistant

## 2023-12-22 ENCOUNTER — Ambulatory Visit (INDEPENDENT_AMBULATORY_CARE_PROVIDER_SITE_OTHER): Admitting: Physician Assistant

## 2023-12-22 ENCOUNTER — Ambulatory Visit

## 2023-12-22 ENCOUNTER — Other Ambulatory Visit

## 2023-12-22 VITALS — BP 89/59 | HR 63 | Resp 18 | Ht 62.0 in | Wt 136.0 lb

## 2023-12-22 DIAGNOSIS — R413 Other amnesia: Secondary | ICD-10-CM | POA: Insufficient documentation

## 2023-12-22 LAB — VITAMIN B12: Vitamin B-12: 708 pg/mL (ref 200–1100)

## 2023-12-22 LAB — TSH: TSH: 1.35 m[IU]/L (ref 0.40–4.50)

## 2023-12-22 MED ORDER — DONEPEZIL HCL 10 MG PO TABS
ORAL_TABLET | ORAL | 3 refills | Status: AC
Start: 2023-12-22 — End: ?

## 2023-12-22 NOTE — Patient Instructions (Signed)
 It was a pleasure to see you today at our office.   Recommendations:   MRI of the brain, the radiology office will call you to arrange you appointment   Check labs today   Start Donepezil 10 mg :Take half tablet (5 mg) daily for 2 weeks, then increase to the full tablet at 10 mg daily. Side effects discussed   Follow up in 3 months  Recommend visiting the website :  Dementia Success Path to better understand some behaviors related to memory loss.      https://www.barrowneuro.org/resource/neuro-rehabilitation-apps-and-games/   RECOMMENDATIONS FOR ALL PATIENTS WITH MEMORY PROBLEMS: 1. Continue to exercise (Recommend 30 minutes of walking everyday, or 3 hours every week) 2. Increase social interactions - continue going to [place] and enjoy social gatherings with friends and family 3. Eat healthy, avoid fried foods and eat more fruits and vegetables 4. Maintain adequate blood pressure, blood sugar, and blood cholesterol level. Reducing the risk of stroke and cardiovascular disease also helps promoting better memory. 5. Avoid stressful situations. Live a simple life and avoid aggravations. Organize your time and prepare for the next day in anticipation. 6. Sleep well, avoid any interruptions of sleep and avoid any distractions in the bedroom that may interfere with adequate sleep quality 7. Avoid sugar, avoid sweets as there is a strong link between excessive sugar intake, diabetes, and cognitive impairment We discussed the Mediterranean diet, which has been shown to help patients reduce the risk of progressive memory disorders and reduces cardiovascular risk. This includes eating fish, eat fruits and green leafy vegetables, nuts like almonds and hazelnuts, walnuts, and also use olive oil. Avoid fast foods and fried foods as much as possible. Avoid sweets and sugar as sugar use has been linked to worsening of memory function.  There is always a concern of gradual progression of memory problems.  If this is the case, then we may need to adjust level of care according to patient needs. Support, both to the patient and caregiver, should then be put into place.         DRIVING: Regarding driving, in patients with progressive memory problems, driving will be impaired. We advise to have someone else do the driving if trouble finding directions or if minor accidents are reported. Independent driving assessment is available to determine safety of driving.   If you are interested in the driving assessment, you can contact the following:  [employer] in [place] [phone]  [facility] [phone]  [facility] [phone]  [facility] [phone] or [phone]   FALL PRECAUTIONS: Be cautious when walking. Scan the area for obstacles that may increase the risk of trips and falls. When getting up in the mornings, sit up at the edge of the bed for a few minutes before getting out of bed. Consider elevating the bed at the head end to avoid drop of blood pressure when getting up. Walk always in a well-lit room (use night lights in the walls). Avoid area rugs or power cords from appliances in the middle of the walkways. Use a walker or a cane if necessary and consider physical therapy for balance exercise. Get your eyesight checked regularly.  FINANCIAL OVERSIGHT: Supervision, especially oversight when making financial decisions or transactions is also recommended.  HOME SAFETY: Consider the safety of the kitchen when operating appliances like stoves, microwave oven, and blender. Consider having supervision and share cooking responsibilities until no longer able to participate in those. Accidents with firearms and other hazards in  the house should be identified and addressed as well.   ABILITY TO BE LEFT ALONE: If patient is unable to contact 911 operator, consider using LifeLine, or when the need is there, arrange for someone to stay with  patients. Smoking is a fire hazard, consider supervision or cessation. Risk of wandering should be assessed by caregiver and if detected at any point, supervision and safe proof recommendations should be instituted.  MEDICATION SUPERVISION: Inability to self-administer medication needs to be constantly addressed. Implement a mechanism to ensure safe administration of the medications.      Mediterranean Diet A Mediterranean diet refers to food and lifestyle choices that are based on the traditions of countries located on the [employer] [place]. This way of eating has been shown to help prevent certain conditions and improve outcomes for people who have chronic diseases, like kidney disease and heart disease. What are tips for following this plan? Lifestyle  Cook and eat meals together with your family, when possible. Drink enough fluid to keep your urine clear or pale yellow. Be physically active every day. This includes: Aerobic exercise like running or swimming. Leisure activities like gardening, walking, or housework. Get 7-8 hours of sleep each night. If recommended by your health care provider, drink red wine in moderation. This means 1 glass a day for nonpregnant women and 2 glasses a day for men. A glass of wine equals 5 oz (150 mL). Reading food labels  Check the serving size of packaged foods. For foods such as rice and pasta, the serving size refers to the amount of cooked product, not dry. Check the total fat in packaged foods. Avoid foods that have saturated fat or trans fats. Check the ingredients list for added sugars, such as corn syrup. Shopping  At the grocery store, buy most of your food from the areas near the walls of the store. This includes: Fresh fruits and vegetables (produce). Grains, beans, nuts, and seeds. Some of these may be available in unpackaged forms or large amounts (in bulk). Fresh seafood. Poultry and eggs. Low-fat dairy products. Buy whole ingredients  instead of prepackaged foods. Buy fresh fruits and vegetables in-season from local farmers markets. Buy frozen fruits and vegetables in resealable bags. If you do not have access to quality fresh seafood, buy precooked frozen shrimp or canned fish, such as tuna, salmon, or sardines. Buy small amounts of raw or cooked vegetables, salads, or olives from the deli or salad bar at your store. Stock your pantry so you always have certain foods on hand, such as olive oil, canned tuna, canned tomatoes, rice, pasta, and beans. Cooking  Cook foods with extra-virgin olive oil instead of using butter or other vegetable oils. Have meat as a side dish, and have vegetables or grains as your main dish. This means having meat in small portions or adding small amounts of meat to foods like pasta or stew. Use beans or vegetables instead of meat in common dishes like chili or lasagna. Experiment with different cooking methods. Try roasting or broiling vegetables instead of steaming or sauteing them. Add frozen vegetables to soups, stews, pasta, or rice. Add nuts or seeds for added healthy fat at each meal. You can add these to yogurt, salads, or vegetable dishes. Marinate fish or vegetables using olive oil, lemon juice, garlic, and fresh herbs. Meal planning  Plan to eat 1 vegetarian meal one day each week. Try to work up to 2 vegetarian meals, if possible. Eat seafood 2 or more times a  week. Have healthy snacks readily available, such as: Vegetable sticks with hummus. Greek yogurt. Fruit and nut trail mix. Eat balanced meals throughout the week. This includes: Fruit: 2-3 servings a day Vegetables: 4-5 servings a day Low-fat dairy: 2 servings a day Fish, poultry, or lean meat: 1 serving a day Beans and legumes: 2 or more servings a week Nuts and seeds: 1-2 servings a day Whole grains: 6-8 servings a day Extra-virgin olive oil: 3-4 servings a day Limit red meat and sweets to only a few servings a  month What are my food choices? Mediterranean diet Recommended Grains: Whole-grain pasta. Brown rice. Bulgar wheat. Polenta. Couscous. Whole-wheat bread. [place] Made[place]. Vegetables: Artichokes. Beets. Broccoli. Cabbage. Carrots. Eggplant. Green beans. Chard. Kale. Spinach. Onions. Leeks. Peas. Squash. Tomatoes. Peppers. Radishes. Fruits: Apples. Apricots. Avocado. Berries. Bananas. Cherries. Dates. Figs. Grapes. Lemons. Melon. Oranges. Peaches. Plums. Pomegranate. Meats and other protein foods: Beans. Almonds. Sunflower seeds. Pine nuts. Peanuts. Cod. Salmon. Scallops. Shrimp. Tuna. Tilapia. Clams. Oysters. Eggs. Dairy: Low-fat milk. Cheese. Greek yogurt. Beverages: Water. Red wine. Herbal tea. Fats and oils: Extra virgin olive oil. Avocado oil. Grape seed oil. Sweets and desserts: [place] yogurt with honey. Baked apples. Poached pears. Trail mix. Seasoning and other foods: Basil. Cilantro. Coriander. Cumin. Mint. Parsley. Sage. Rosemary. Tarragon. Garlic. Oregano. Thyme. Pepper. Balsalmic vinegar. Tahini. Hummus. Tomato sauce. Olives. Mushrooms. Limit these Grains: Prepackaged pasta or rice dishes. Prepackaged cereal with added sugar. Vegetables: Deep fried potatoes (french fries). Fruits: Fruit canned in syrup. Meats and other protein foods: Beef. Pork. Lamb. Poultry with skin. Hot dogs. [place]. Dairy: Ice cream. Sour cream. Whole milk. Beverages: Juice. Sugar-sweetened soft drinks. Beer. Liquor and spirits. Fats and oils: Butter. Canola oil. Vegetable oil. Beef fat (tallow). Lard. Sweets and desserts: Cookies. Cakes. Pies. Candy. Seasoning and other foods: Mayonnaise. Premade sauces and marinades. The items listed may not be a complete list. Talk with your dietitian about what dietary choices are right for you. Summary The Mediterranean diet includes both food and lifestyle choices. Eat a variety of fresh fruits and vegetables, beans, nuts, seeds, and whole grains. Limit the amount of red  meat and sweets that you eat. Talk with your health care provider about whether it is safe for you to drink red wine in moderation. This means 1 glass a day for nonpregnant women and 2 glasses a day for men. A glass of wine equals 5 oz (150 mL). This information is not intended to replace advice given to you by your health care provider. Make sure you discuss any questions you have with your health care provider. Document Released: 10/11/2015 Document Revised: 11/13/2015 Document Reviewed: 10/11/2015 Elsevier Interactive Patient Education  2017 [employer].

## 2023-12-23 ENCOUNTER — Ambulatory Visit: Payer: Self-pay | Admitting: Physician Assistant

## 2023-12-28 ENCOUNTER — Telehealth: Payer: Self-pay | Admitting: Physician Assistant

## 2023-12-28 NOTE — Telephone Encounter (Signed)
 Questions on effects of Rx, throwing up and light headed

## 2023-12-29 NOTE — Telephone Encounter (Signed)
 I advised to [facility] POA her sister to hold medication and to call office for update in one week. She thanked me for calling. All Questions were asked and verbally understaning to hold medication

## 2024-02-19 ENCOUNTER — Telehealth: Payer: Self-pay

## 2024-02-19 NOTE — Telephone Encounter (Signed)
 Patient never had Mri brain wo contrast, multiple attempts made to contact patient.Just FYI.

## 2024-03-16 ENCOUNTER — Telehealth: Payer: Self-pay | Admitting: Physician Assistant

## 2024-03-16 NOTE — Telephone Encounter (Signed)
 Orlean Berg (sister) lvm stating she need to talk to someone about MRI scheduling.  PH: [phone]

## 2024-03-16 NOTE — Telephone Encounter (Signed)
 [phone] is the number for patient sister to call for Mri at [facility]

## 2024-03-23 ENCOUNTER — Other Ambulatory Visit

## 2024-04-01 ENCOUNTER — Ambulatory Visit
Admission: RE | Admit: 2024-04-01 | Discharge: 2024-04-01 | Disposition: A | Source: Ambulatory Visit | Attending: Physician Assistant | Admitting: Physician Assistant

## 2024-04-06 NOTE — Progress Notes (Incomplete)
 "   Dementia likely due to    Olivia Jackson is a very pleasant 84 y.o. RH female with a history of  hypertension, hyperlipidemia, Iron deficiency anemia, PVD, DM2, seen today in follow up for memory loss. Patient is currently on donepezil  10 mg daily***.   Patient was last seen on ***. Memory is ***. MMSE today is  /30. Patient is able to participate on ADLs and continues to drive without difficulties. Mood is*** . This patient is accompanied in the office by her sister*** who supplements the history.  Previous records as well as any outside records available were reviewed prior to todays visit.   Follow up in  months*** Recommend good control of cardiovascular risk factors.   Continue to control mood as per PCP   Discussed the use of AI scribe software for clinical note transcription with the patient, who gave verbal consent to proceed.  History of Present Illness   Initial visit 12/22/2023 How long did patient have memory difficulties? She denies any issues. For about 1 year little things.  Patient reports some difficulty remembering new information, recent conversations, names. LTM is fair.. She loves reading. repeats oneself?  Endorsed by her sister Disoriented when walking into a room? Denies    Leaving objects in unusual places?  Denies. She may unplug the phone and think that the line is cut off.    Wandering behavior? Denies.   Any personality changes, or depression, anxiety? Once in a while I feel down Hallucinations or paranoia? Denies.   Seizures? Denies.    Any sleep changes?  Sleeps well wonderful Denies frequent nightmares or dream reenactment, other REM behavior or sleepwalking   Sleep apnea? Denies.   Any hygiene concerns?  Denies.   Independent of bathing and dressing? Endorsed  Does the patient need help with medications?  Patient  is in charge   Who is in charge of the finances? Brother is in charge     Any changes in appetite? Trying to watch her diet,  eats healthier.     Patient have trouble swallowing?  Denies.   Does the patient cook?Yes, denies forgetting common recipes or kitchen accidents   Any headaches?  Denies.   Chronic pain? Denies.   Ambulates with difficulty? She has chronic LLE lymphedema which may limit her mobility.    Recent falls or head injuries? Denies.     Vision changes?  Denies any new issues.  Any strokelike symptoms? Denies.   Any tremors? Denies.   Any anosmia? Denies.   Any incontinence of urine? Denies.   Any bowel dysfunction? Denies.      Patient lives alone History of heavy alcohol intake? Denies.   History of heavy tobacco use? Denies.   Family history of dementia? Denies  Does patient drive? No longer drives  High School.  Retired housekeeper   MRI of the brain, personally reviewed 04/01/2024 with mild cerebral volume loss, mild chronic small vessel disease, cerebral atrophy no acute findings         No data to display            12/22/2023    1:00 PM  Montreal Cognitive Assessment   Visuospatial/ Executive (0/5) 0  Naming (0/3) 2  Attention: Read list of digits (0/2) 2  Attention: Read list of letters (0/1) 1  Attention: Serial 7 subtraction starting at 100 (0/3) 0  Language: Repeat phrase (0/2) 1  Language : Fluency (0/1) 1  Abstraction (0/2) 0  Delayed  Recall (0/5) 0  Orientation (0/6) 6  Total 13  Adjusted Score (based on education) 14      Objective:    Neurological Exam:    VITALS:  There were no vitals filed for this visit.  GEN:  The patient appears stated age and is in NAD. HEENT:  Normocephalic, atraumatic.   Neurological examination:  General: NAD, well-groomed, appears stated age. Orientation: The patient is alert. Oriented to person, place and date Cranial nerves: There is good facial symmetry.The speech is fluent and clear. No aphasia or dysarthria. Fund of knowledge is reduced. Recent and remote memory are impaired. Attention and concentration are reduced.  Able to name objects and repeat phrases.  Hearing is intact to conversational tone. *** Sensation: Sensation is intact to light touch throughout Motor: Strength is at least antigravity x4. DTR's 2/4 in UE/LE     Movement examination:  Tone: There is normal tone in the UE/LE Abnormal movements:  no tremor.  No myoclonus.  No asterixis.   Coordination:  There is no decremation with RAM's. Normal finger to nose  Gait and Station: The patient has no*** difficulty arising out of a deep-seated chair without the use of the hands. The patient's stride length is good.  Gait is cautious and narrow.    Thank you for allowing us  the opportunity to participate in the care of this nice patient. Please do not hesitate to contact us  for any questions or concerns.   Total time spent on today's visit was *** minutes dedicated to this patient today, preparing to see patient, examining the patient, ordering tests and/or medications and counseling the patient, documenting clinical information in the EHR or other health record, independently interpreting results and communicating results to the patient/family, discussing treatment and goals, answering patient's questions and coordinating care.  Cc:  Leonel Cole, MD  Camie Sevin 04/06/2024 6:21 AM      "

## 2024-04-07 ENCOUNTER — Ambulatory Visit: Payer: Self-pay | Admitting: Physician Assistant

## 2024-04-18 ENCOUNTER — Ambulatory Visit: Admitting: Physician Assistant
